# Patient Record
Sex: Male | Born: 1955
Health system: Southern US, Community
[De-identification: ages and names within clinical notes are randomized; demographics above are authoritative.]

## PROBLEM LIST (undated history)

## (undated) DIAGNOSIS — E876 Hypokalemia: Secondary | ICD-10-CM

## (undated) DIAGNOSIS — I10 Essential (primary) hypertension: Secondary | ICD-10-CM

## (undated) DIAGNOSIS — I213 ST elevation (STEMI) myocardial infarction of unspecified site: Secondary | ICD-10-CM

## (undated) DIAGNOSIS — M109 Gout, unspecified: Secondary | ICD-10-CM

## (undated) DIAGNOSIS — E785 Hyperlipidemia, unspecified: Secondary | ICD-10-CM

## (undated) HISTORY — DX: ST elevation (STEMI) myocardial infarction of unspecified site: I21.3

## (undated) HISTORY — DX: Essential (primary) hypertension: I10

## (undated) HISTORY — DX: Hypokalemia: E87.6

---

## 2016-11-25 ENCOUNTER — Encounter (HOSPITAL_BASED_OUTPATIENT_CLINIC_OR_DEPARTMENT_OTHER): Payer: Self-pay | Admitting: Emergency Medicine

## 2016-11-25 ENCOUNTER — Emergency Department (HOSPITAL_BASED_OUTPATIENT_CLINIC_OR_DEPARTMENT_OTHER): Payer: Self-pay

## 2016-11-25 ENCOUNTER — Inpatient Hospital Stay (HOSPITAL_BASED_OUTPATIENT_CLINIC_OR_DEPARTMENT_OTHER)
Admission: EM | Admit: 2016-11-25 | Discharge: 2016-11-27 | DRG: 282 | Disposition: A | Discharge status: Home / Self Care | Payer: Self-pay | Attending: Cardiovascular Disease | Admitting: Cardiovascular Disease

## 2016-11-25 ENCOUNTER — Encounter (HOSPITAL_COMMUNITY)
Admission: EM | Disposition: A | Discharge status: Home / Self Care | Payer: Self-pay | Source: Home / Self Care | Attending: Cardiovascular Disease

## 2016-11-25 ENCOUNTER — Other Ambulatory Visit: Payer: Self-pay

## 2016-11-25 DIAGNOSIS — I213 ST elevation (STEMI) myocardial infarction of unspecified site: Secondary | ICD-10-CM

## 2016-11-25 DIAGNOSIS — R402414 Glasgow coma scale score 13-15, 24 hours or more after hospital admission: Secondary | ICD-10-CM | POA: Diagnosis not present

## 2016-11-25 DIAGNOSIS — I2129 ST elevation (STEMI) myocardial infarction involving other sites: Secondary | ICD-10-CM

## 2016-11-25 DIAGNOSIS — I25110 Atherosclerotic heart disease of native coronary artery with unstable angina pectoris: Secondary | ICD-10-CM | POA: Diagnosis present

## 2016-11-25 DIAGNOSIS — I10 Essential (primary) hypertension: Secondary | ICD-10-CM | POA: Diagnosis present

## 2016-11-25 DIAGNOSIS — Z79899 Other long term (current) drug therapy: Secondary | ICD-10-CM

## 2016-11-25 DIAGNOSIS — E785 Hyperlipidemia, unspecified: Secondary | ICD-10-CM

## 2016-11-25 DIAGNOSIS — E876 Hypokalemia: Secondary | ICD-10-CM

## 2016-11-25 HISTORY — DX: Hyperlipidemia, unspecified: E78.5

## 2016-11-25 HISTORY — PX: LEFT HEART CATH AND CORONARY ANGIOGRAPHY: CATH118249

## 2016-11-25 HISTORY — DX: ST elevation (STEMI) myocardial infarction of unspecified site: I21.3

## 2016-11-25 HISTORY — DX: ST elevation (STEMI) myocardial infarction involving other sites: I21.29

## 2016-11-25 HISTORY — DX: Essential (primary) hypertension: I10

## 2016-11-25 LAB — POCT I-STAT, CHEM 8
BUN: 15 mg/dL (ref 6–20)
CHLORIDE: 101 mmol/L (ref 101–111)
CREATININE: 1 mg/dL (ref 0.61–1.24)
Calcium, Ion: 1.16 mmol/L (ref 1.15–1.40)
Glucose, Bld: 133 mg/dL — ABNORMAL HIGH (ref 65–99)
HEMATOCRIT: 43 % (ref 39.0–52.0)
HEMOGLOBIN: 14.6 g/dL (ref 13.0–17.0)
Potassium: 3 mmol/L — ABNORMAL LOW (ref 3.5–5.1)
Sodium: 142 mmol/L (ref 135–145)
TCO2: 28 mmol/L (ref 22–32)

## 2016-11-25 LAB — CBC WITH DIFFERENTIAL/PLATELET
BASOS ABS: 0 10*3/uL (ref 0.0–0.1)
BASOS PCT: 1 %
Eosinophils Absolute: 0.2 10*3/uL (ref 0.0–0.7)
Eosinophils Relative: 3 %
HEMATOCRIT: 42.7 % (ref 39.0–52.0)
HEMOGLOBIN: 14.7 g/dL (ref 13.0–17.0)
LYMPHS PCT: 43 %
Lymphs Abs: 3.1 10*3/uL (ref 0.7–4.0)
MCH: 28.3 pg (ref 26.0–34.0)
MCHC: 34.4 g/dL (ref 30.0–36.0)
MCV: 82.1 fL (ref 78.0–100.0)
MONO ABS: 0.7 10*3/uL (ref 0.1–1.0)
MONOS PCT: 10 %
NEUTROS ABS: 3.2 10*3/uL (ref 1.7–7.7)
NEUTROS PCT: 43 %
Platelets: 145 10*3/uL — ABNORMAL LOW (ref 150–400)
RBC: 5.2 MIL/uL (ref 4.22–5.81)
RDW: 14 % (ref 11.5–15.5)
WBC: 7.3 10*3/uL (ref 4.0–10.5)

## 2016-11-25 LAB — COMPREHENSIVE METABOLIC PANEL
ALBUMIN: 4 g/dL (ref 3.5–5.0)
ALK PHOS: 51 U/L (ref 38–126)
ALT: 18 U/L (ref 17–63)
AST: 28 U/L (ref 15–41)
Anion gap: 5 (ref 5–15)
BILIRUBIN TOTAL: 0.3 mg/dL (ref 0.3–1.2)
BUN: 16 mg/dL (ref 6–20)
CALCIUM: 9 mg/dL (ref 8.9–10.3)
CO2: 31 mmol/L (ref 22–32)
CREATININE: 1.13 mg/dL (ref 0.61–1.24)
Chloride: 103 mmol/L (ref 101–111)
GFR calc Af Amer: >60 mL/min (ref >60)
GLUCOSE: 175 mg/dL — AB (ref 65–99)
Potassium: 2.9 mmol/L — ABNORMAL LOW (ref 3.5–5.1)
Sodium: 139 mmol/L (ref 135–145)
TOTAL PROTEIN: 7.4 g/dL (ref 6.5–8.1)

## 2016-11-25 LAB — PROTIME-INR
INR: 0.94
Prothrombin Time: 12.5 seconds (ref 11.4–15.2)

## 2016-11-25 LAB — TROPONIN I
TROPONIN I: 0.03 ng/mL — AB (ref <0.03)
Troponin I: 0.6 ng/mL (ref <0.03)

## 2016-11-25 LAB — MRSA PCR SCREENING: MRSA by PCR: NEGATIVE

## 2016-11-25 SURGERY — LEFT HEART CATH AND CORONARY ANGIOGRAPHY
Anesthesia: LOCAL

## 2016-11-25 MED ORDER — METOPROLOL TARTRATE 25 MG PO TABS
25.0000 mg | ORAL_TABLET | Freq: Two times a day (BID) | ORAL | Status: DC
  Administered 2016-11-25 – 2016-11-27 (×4): 25 mg via ORAL
  Filled 2016-11-25 (×4): qty 1

## 2016-11-25 MED ORDER — IOPAMIDOL (ISOVUE-370) INJECTION 76%
INTRAVENOUS | Status: DC | PRN
  Administered 2016-11-25: 80 mL via INTRA_ARTERIAL

## 2016-11-25 MED ORDER — ATORVASTATIN CALCIUM 80 MG PO TABS
80.0000 mg | ORAL_TABLET | Freq: Every day | ORAL | Status: DC
  Administered 2016-11-26: 80 mg via ORAL
  Filled 2016-11-25: qty 1

## 2016-11-25 MED ORDER — NITROGLYCERIN IN D5W 200-5 MCG/ML-% IV SOLN
INTRAVENOUS | Status: AC
  Filled 2016-11-25: qty 250

## 2016-11-25 MED ORDER — VERAPAMIL HCL 2.5 MG/ML IV SOLN
INTRAVENOUS | Status: AC
  Filled 2016-11-25: qty 2

## 2016-11-25 MED ORDER — MORPHINE SULFATE (PF) 4 MG/ML IV SOLN: 2.0000 mg | INTRAVENOUS | Status: DC | PRN

## 2016-11-25 MED ORDER — IOPAMIDOL (ISOVUE-370) INJECTION 76%
INTRAVENOUS | Status: AC
  Filled 2016-11-25: qty 125

## 2016-11-25 MED ORDER — SODIUM CHLORIDE 0.9 % IV SOLN
INTRAVENOUS | Status: AC
  Administered 2016-11-25: 20:00:00 via INTRAVENOUS

## 2016-11-25 MED ORDER — HEPARIN SODIUM (PORCINE) 5000 UNIT/ML IJ SOLN
4000.0000 [IU] | Freq: Once | INTRAMUSCULAR | Status: AC
  Administered 2016-11-25: 4000 [IU] via INTRAVENOUS

## 2016-11-25 MED ORDER — HEPARIN (PORCINE) IN NACL 2-0.9 UNIT/ML-% IJ SOLN
INTRAMUSCULAR | Status: DC | PRN
  Administered 2016-11-25: 1000 mL via INTRA_ARTERIAL

## 2016-11-25 MED ORDER — VERAPAMIL HCL 2.5 MG/ML IV SOLN
INTRA_ARTERIAL | Status: DC | PRN
  Administered 2016-11-25: 19:00:00 via INTRA_ARTERIAL

## 2016-11-25 MED ORDER — SODIUM CHLORIDE 0.9 % IV SOLN: 250.0000 mL | INTRAVENOUS | Status: DC | PRN

## 2016-11-25 MED ORDER — NITROGLYCERIN IN D5W 200-5 MCG/ML-% IV SOLN: 0.0000 ug/min | Freq: Once | INTRAVENOUS | Status: DC

## 2016-11-25 MED ORDER — ASPIRIN 81 MG PO CHEW
CHEWABLE_TABLET | ORAL | Status: AC
  Filled 2016-11-25: qty 4

## 2016-11-25 MED ORDER — ASPIRIN 81 MG PO CHEW
324.0000 mg | CHEWABLE_TABLET | Freq: Once | ORAL | Status: AC
  Administered 2016-11-25: 324 mg via ORAL

## 2016-11-25 MED ORDER — TICAGRELOR 90 MG PO TABS
90.0000 mg | ORAL_TABLET | Freq: Two times a day (BID) | ORAL | Status: DC
  Administered 2016-11-26 – 2016-11-27 (×3): 90 mg via ORAL
  Filled 2016-11-25 (×4): qty 1

## 2016-11-25 MED ORDER — SODIUM CHLORIDE 0.9% FLUSH: 3.0000 mL | Freq: Two times a day (BID) | INTRAVENOUS | Status: DC

## 2016-11-25 MED ORDER — HEPARIN SODIUM (PORCINE) 1000 UNIT/ML IJ SOLN
INTRAMUSCULAR | Status: AC
  Filled 2016-11-25: qty 1

## 2016-11-25 MED ORDER — ASPIRIN 81 MG PO CHEW
81.0000 mg | CHEWABLE_TABLET | Freq: Every day | ORAL | Status: DC
  Administered 2016-11-26 – 2016-11-27 (×2): 81 mg via ORAL
  Filled 2016-11-25 (×2): qty 1

## 2016-11-25 MED ORDER — NITROGLYCERIN 1 MG/10 ML FOR IR/CATH LAB
INTRA_ARTERIAL | Status: AC
  Filled 2016-11-25: qty 10

## 2016-11-25 MED ORDER — HEPARIN SODIUM (PORCINE) 1000 UNIT/ML IJ SOLN
INTRAMUSCULAR | Status: DC | PRN
  Administered 2016-11-25: 5000 [IU] via INTRAVENOUS

## 2016-11-25 MED ORDER — ACETAMINOPHEN 325 MG PO TABS: 650.0000 mg | ORAL_TABLET | ORAL | Status: DC | PRN

## 2016-11-25 MED ORDER — ONDANSETRON HCL 4 MG/2ML IJ SOLN
4.0000 mg | Freq: Four times a day (QID) | INTRAMUSCULAR | Status: DC | PRN
  Administered 2016-11-25: 4 mg via INTRAVENOUS
  Filled 2016-11-25: qty 2

## 2016-11-25 MED ORDER — HEPARIN (PORCINE) IN NACL 100-0.45 UNIT/ML-% IJ SOLN
1550.0000 [IU]/h | INTRAMUSCULAR | Status: DC
  Administered 2016-11-26: 1350 [IU]/h via INTRAVENOUS
  Filled 2016-11-25: qty 250

## 2016-11-25 MED ORDER — HEPARIN SODIUM (PORCINE) 5000 UNIT/ML IJ SOLN
INTRAMUSCULAR | Status: AC
  Filled 2016-11-25: qty 1

## 2016-11-25 MED ORDER — NITROGLYCERIN 0.4 MG SL SUBL: 0.4000 mg | SUBLINGUAL_TABLET | SUBLINGUAL | Status: DC | PRN

## 2016-11-25 MED ORDER — POTASSIUM CHLORIDE CRYS ER 20 MEQ PO TBCR
40.0000 meq | EXTENDED_RELEASE_TABLET | Freq: Once | ORAL | Status: AC
  Administered 2016-11-25: 40 meq via ORAL
  Filled 2016-11-25: qty 2

## 2016-11-25 MED ORDER — SODIUM CHLORIDE 0.9% FLUSH
3.0000 mL | INTRAVENOUS | Status: DC | PRN
Start: 2016-11-25 — End: 2016-11-27
  Administered 2016-11-26: 10 mL via INTRAVENOUS
  Filled 2016-11-25: qty 3

## 2016-11-25 MED ORDER — HEPARIN (PORCINE) IN NACL 100-0.45 UNIT/ML-% IJ SOLN
INTRAMUSCULAR | Status: AC
Start: 2016-11-25 — End: 2016-11-26
  Filled 2016-11-25: qty 250

## 2016-11-25 MED ORDER — TICAGRELOR 90 MG PO TABS
ORAL_TABLET | ORAL | Status: AC
  Filled 2016-11-25: qty 2

## 2016-11-25 MED ORDER — TICAGRELOR 90 MG PO TABS
ORAL_TABLET | ORAL | Status: DC | PRN
  Administered 2016-11-25: 180 mg via ORAL

## 2016-11-25 MED ORDER — PROMETHAZINE HCL 25 MG/ML IJ SOLN
12.5000 mg | Freq: Four times a day (QID) | INTRAMUSCULAR | Status: DC | PRN
  Administered 2016-11-25: 12.5 mg via INTRAVENOUS
  Filled 2016-11-25: qty 1

## 2016-11-25 MED ORDER — POTASSIUM CHLORIDE 10 MEQ/100ML IV SOLN
INTRAVENOUS | Status: DC | PRN
Start: 2016-11-25 — End: 2016-11-25
  Administered 2016-11-25: 10 meq via INTRAVENOUS

## 2016-11-25 MED ORDER — HEPARIN (PORCINE) IN NACL 100-0.45 UNIT/ML-% IJ SOLN
15.0000 [IU]/kg/h | Freq: Once | INTRAMUSCULAR | Status: AC
  Administered 2016-11-25: 15 [IU]/kg/h via INTRAVENOUS

## 2016-11-25 MED ORDER — SODIUM CHLORIDE 0.9 % IV SOLN
INTRAVENOUS | Status: DC | PRN
  Administered 2016-11-25: 50 mL/h via INTRAVENOUS

## 2016-11-25 MED ORDER — LIDOCAINE HCL (PF) 1 % IJ SOLN
INTRAMUSCULAR | Status: DC | PRN
  Administered 2016-11-25: 2 mL

## 2016-11-25 MED ORDER — LIDOCAINE HCL (PF) 1 % IJ SOLN
INTRAMUSCULAR | Status: AC
  Filled 2016-11-25: qty 30

## 2016-11-25 MED ORDER — POTASSIUM CHLORIDE 10 MEQ/100ML IV SOLN
10.0000 meq | INTRAVENOUS | Status: AC
  Administered 2016-11-25 (×3): 10 meq via INTRAVENOUS

## 2016-11-25 MED ORDER — MORPHINE SULFATE (PF) 10 MG/ML IV SOLN: 2.0000 mg | INTRAVENOUS | Status: DC | PRN

## 2016-11-25 MED ORDER — HEPARIN (PORCINE) IN NACL 2-0.9 UNIT/ML-% IJ SOLN
INTRAMUSCULAR | Status: AC
  Filled 2016-11-25: qty 1500

## 2016-11-25 SURGICAL SUPPLY — 13 items
CATH INFINITI 5FR ANG PIGTAIL (CATHETERS) ×2 IMPLANT
CATH OPTITORQUE TIG 4.0 5F (CATHETERS) ×2 IMPLANT
DEVICE RAD COMP TR BAND LRG (VASCULAR PRODUCTS) ×2 IMPLANT
GLIDESHEATH SLEND A-KIT 6F 22G (SHEATH) ×2 IMPLANT
GUIDEWIRE INQWIRE 1.5J.035X260 (WIRE) ×1 IMPLANT
INQWIRE 1.5J .035X260CM (WIRE) ×2
KIT ENCORE 26 ADVANTAGE (KITS) ×2 IMPLANT
KIT HEART LEFT (KITS) ×2 IMPLANT
PACK CARDIAC CATHETERIZATION (CUSTOM PROCEDURE TRAY) ×2 IMPLANT
SYR MEDRAD MARK V 150ML (SYRINGE) ×2 IMPLANT
TRANSDUCER W/STOPCOCK (MISCELLANEOUS) ×2 IMPLANT
TUBING CIL FLEX 10 FLL-RA (TUBING) ×2 IMPLANT
WIRE HI TORQ VERSACORE-J 145CM (WIRE) ×2 IMPLANT

## 2016-11-25 NOTE — ED Provider Notes (Signed)
[facility] CARDIOVASCULAR ICU Provider Note   CSN: [id number] Arrival date & time: 11/25/16  1748     History   Chief Complaint Chief Complaint  Patient presents with  . Chest Pain    HPI Vincent [name] is a 61 y.o. male. Chief complaint is chest pain, diaphoresis. Symptoms started 2 hours ago.  HPI Vincent [name] is a 61 year old male. History of hypertension and high cholesterol.  He is on an unknown blood pressure medication. Nonsmoker. No history of diabetes. No history of first relative with heart disease. No exertional symptoms.  About 2 hours ago he states he was driving. He started feeling pressure in his chest. Started on the right side of his sternum and progress to the left side and into his back. He became diaphoretic. His pain became severe. He states that has improved but not resolved now. No past similar episodes.  Past Medical History:  Diagnosis Date  . Hyperlipidemia   . Hypertension     Patient Active Problem List   Diagnosis Date Noted  . 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 Acute ST elevation myocardial infarction (STEMI) of lateral wall (HCC) 11/25/2016    History reviewed. No pertinent surgical history.     Home Medications    Prior to Admission medications   Medication Sig Start Date End Date Taking? Authorizing Provider  UNKNOWN TO PATIENT 2 (two) times daily. Pt states that it is Lopressor or Lisinopril.    [provider]    Family History History reviewed. No pertinent family history.  Social History Social History   Tobacco Use  . Smoking status: Never Smoker  . Smokeless tobacco: Never Used  Substance Use Topics  . Alcohol use: No    Frequency: Never  . Drug use: No     Allergies   Patient has no known allergies.   Review of Systems Review of Systems  Constitutional: Positive for diaphoresis. Negative for appetite change, chills, fatigue and fever.  HENT: Negative for  mouth sores, sore throat and trouble swallowing.   Eyes: Negative for visual disturbance.  Respiratory: Negative for cough, chest tightness, shortness of breath and wheezing.   Cardiovascular: Positive for chest pain.  Gastrointestinal: Negative for abdominal distention, abdominal pain, diarrhea, nausea and vomiting.  Endocrine: Negative for polydipsia, polyphagia and polyuria.  Genitourinary: Negative for dysuria, frequency and hematuria.  Musculoskeletal: Negative for gait problem.  Skin: Negative for color change, pallor and rash.  Neurological: Negative for dizziness, syncope, light-headedness and headaches.  Hematological: Does not bruise/bleed easily.  Psychiatric/Behavioral: Negative for behavioral problems and confusion.     Physical Exam Updated Vital Signs BP (!) 150/86   Pulse 88   Temp 98.2 F (36.8 C) (Oral)   Resp 16   Ht 5\' 11"  (1.803 m)   Wt 102.1 kg (225 lb)   SpO2 100%   BMI 31.38 kg/m   Physical Exam  Constitutional: He is oriented to person, place, and time. He appears well-developed and well-nourished. No distress.  Athletically built fit-appearing 61 year old male. No distress.  HENT:  Head: Normocephalic.  Eyes: Conjunctivae are normal. Pupils are equal, round, and reactive to light. No scleral icterus.  Neck: Normal range of motion. Neck supple. No thyromegaly present.  Cardiovascular: Normal rate and regular rhythm. Exam reveals no gallop and no friction rub.  No murmur heard. Pulmonary/Chest: Effort normal and breath sounds normal. No respiratory distress. He has no wheezes. He has no rales.  Abdominal: Soft.  Bowel sounds are normal. He exhibits no distension. There is no tenderness. There is no rebound.  Musculoskeletal: Normal range of motion.  Neurological: He is alert and oriented to person, place, and time.  Skin: Skin is warm and dry. No rash noted.  Psychiatric: He has a normal mood and affect. His behavior is normal.     ED Treatments /  Results  Labs (all labs ordered are listed, but only abnormal results are displayed) Labs Reviewed  CBC WITH DIFFERENTIAL/PLATELET - Abnormal; Notable for the following components:      Result Value   Platelets 145 (*)    All other components within normal limits  COMPREHENSIVE METABOLIC PANEL - Abnormal; Notable for the following components:   Potassium 2.9 (*)    Glucose, Bld 175 (*)    All other components within normal limits  TROPONIN I - Abnormal; Notable for the following components:   Troponin I 0.03 (*)    All other components within normal limits  POCT I-STAT, CHEM 8 - Abnormal; Notable for the following components:   Potassium 3.0 (*)    Glucose, Bld 133 (*)    All other components within normal limits  MRSA PCR SCREENING  PROTIME-INR  TROPONIN I  TROPONIN I  TROPONIN I  CBC  BASIC METABOLIC PANEL    EKG  EKG Interpretation None       Radiology Dg Chest Port 1 View  Result Date: 11/25/2016 CLINICAL DATA:  Left-sided chest pain EXAM: PORTABLE CHEST 1 VIEW COMPARISON:  None. FINDINGS: The heart size and mediastinal contours are within normal limits. Both lungs are clear. The visualized skeletal structures are unremarkable. IMPRESSION: No active disease. Electronically Signed   By: Alcide CleverMark  Lukens M.D.   On: 11/25/2016 18:19    Procedures Procedures (including critical care time)  Medications Ordered in ED Medications  nitroGLYCERIN 50 mg in dextrose 5 % 250 mL (0.2 mg/mL) infusion ( Intravenous MAR Unhold 11/25/16 1945)  nitroGLYCERIN 0.2 mg/mL in dextrose 5 % infusion (not administered)  acetaminophen (TYLENOL) tablet 650 mg (not administered)  ondansetron (ZOFRAN) injection 4 mg (4 mg Intravenous Given 11/25/16 2015)  0.9 %  sodium chloride infusion (not administered)  sodium chloride flush (NS) 0.9 % injection 3 mL (not administered)  sodium chloride flush (NS) 0.9 % injection 3 mL (not administered)  0.9 %  sodium chloride infusion (not administered)    aspirin chewable tablet 81 mg (not administered)  ticagrelor (BRILINTA) tablet 90 mg (not administered)  morphine 4 MG/ML injection 2 mg (not administered)  heparin injection 4,000 Units (4,000 Units Intravenous Given 11/25/16 1803)  heparin ADULT infusion 100 units/mL (25000 units/21850mL sodium chloride 0.45%) (15 Units/kg/hr  102.1 kg Intravenous New Bag/Given 11/25/16 1806)  aspirin chewable tablet 324 mg (324 mg Oral Given 11/25/16 1802)     Initial Impression / Assessment and Plan / ED Course  I have reviewed the triage vital signs and the nursing notes.  Pertinent labs & imaging results that were available during my care of the patient were reviewed by me and considered in my medical decision making (see chart for details).   EKG shows acute ST elevations 1 mm in lead 1, and aVL. His reciprocal ST depressions in 2, 3, and death. Anteriorly show no acute changes.  Upon evaluating the patient and reviewed his EKG code STEMI was called. Discussed case immediately with Dr. Allyson SabalBerry. He requests transmission of images. They were related directly to him. Patient was given heparin bolus, ordering 4, Zofran  4. States that he did have a dose of Viagra a few hours ago. A glycerin was held. Given baby aspirin 81 mg 4. His pain improved and resolved as he was leaving the department with Tresa Endo. A repeat EKG shows persistence of this ST elevations.   CRITICAL CARE Performed by: Rolland Porter JOSEPH   Total critical care time: 26 minutes  Critical care time was exclusive of separately billable procedures and treating other patients.  Critical care was necessary to treat or prevent imminent or life-threatening deterioration.  Critical care was time spent personally by me on the following activities: development of treatment plan with patient and/or surrogate as well as nursing, discussions with consultants, evaluation of patient's response to treatment, examination of patient, obtaining history from  patient or surrogate, ordering and performing treatments and interventions, ordering and review of laboratory studies, ordering and review of radiographic studies, pulse oximetry and re-evaluation of patient's condition.    Final Clinical Impressions(s) / ED Diagnoses   Final diagnoses:  ST elevation myocardial infarction (STEMI), unspecified artery [facility])    ED Discharge Orders    None       Rolland Porter, MD 11/25/16 2021

## 2016-11-25 NOTE — Progress Notes (Addendum)
ANTICOAGULATION CONSULT NOTE  Pharmacy Consult for heparin Indication: chest pain/ACS  Heparin Dosing Weight: 96.5 kg   Assessment: 61 yom presenting with CP - Code STEMI called, [facility]. Pharmacy consulted to dose heparin. Not on anticoagulation PTA. Hg wnl, plt 145. No bleed documented.  [facility] - 4000 unit bolus x 1 and drip started at 15 units/kg/hr per Cardiology recommendations - a little higher than recommended with heparin dosing weight, but [facility] at this point.  Goal of Therapy:  Heparin level 0.3-0.7 units/ml Monitor platelets by anticoagulation protocol: Yes   Plan:  Continue heparin drip at 1500 units/hr as ordered per Cardiology F/u plans for heparin post-cath Monitor CBC, s/sx bleeding  Vincent [name], PharmD, BCPS Clinical Pharmacist 11/25/2016 6:07 PM   ADDENDUM:  S/p cath - to treat medically. Pharmacy consulted to resume heparin 2 hours post-TR band removal, no bolus. No bleed issues reported post-cath.  Will f/u with RN for TR band removal to schedule heparin.  Plan: Resume heparin at 1350 units/hr with no bolus (2hrs post-TR band removal) 6h heparin level Daily heparin level/CBC Monitor for s/sx bleeding  Vincent [name], PharmD, BCPS Clinical Pharmacist 11/25/2016 7:13 PM

## 2016-11-25 NOTE — ED Notes (Signed)
Attempted to call cath lab at 2 numbers; no answer [phone]

## 2016-11-25 NOTE — ED Notes (Signed)
[facility] to [facility]

## 2016-11-25 NOTE — ED Notes (Signed)
Pt reports taking erectile dysfunction med 2 hrs ago

## 2016-11-25 NOTE — ED Notes (Signed)
Pt denies CP at this time; NTG held per EDP; repeat EKG at this time.

## 2016-11-25 NOTE — ED Triage Notes (Signed)
Pt c/o LT CP, diaphoresis

## 2016-11-25 NOTE — Progress Notes (Signed)
CRITICAL VALUE ALERT  Critical Value:  Troponin 0.60  Date & Time Notied:  11/25/16 @ 2119  Provider Notified: Expected/consistent with previous results  Orders Received/Actions taken: Continue with current plan of post-cath care and medical management

## 2016-11-25 NOTE — H&P (Signed)
CARDIOLOGY HISTORY & PHYSICAL   Referring Physician: Dr. Fayrene FearingJames Primary Physician:  Primary Cardiologist: none prior Reason for Admission: STEMI   HPI: [name] is a 61 yo man with PMH hypertension, hyperlipidemia who presented to ER with two hours of chest pain. He was diagnosed as STEMI and [facility]. The patient reports about two hours prior to presentation he developed right and left sided chest pressure that became severe, associated with diaphoresis and nausea. The pain occurred while he was driving. No prior similar symptoms. No known CAD. On arrival to the ER he was treated with aspirin 324 mg and heparin 4000 units, followed by a heparin drip. He did not receive nitroglycerin as he endorsed recent PDE inhibitor use. His initial ECG was concerning for ST elevations in leads 1 and aVL with reciprocal inferior depressions. Code STEMI was called. His chest pain resolved while in the ER.  On [facility], he was comfortable and chest pain free. He is a nonsmoker, not diabetic.   Review of Systems:     Cardiac Review of Systems: {Y] = yes [ ]  = no  Chest Pain [ Y   ]  Resting SOB [ N  ] Exertional SOB  Klaus.Mock[N  ]  Orthopnea [ N ]   Pedal Edema [  N ]    Palpitations Klaus.Mock[N  ] Syncope  Klaus.Mock[N  ]   Presyncope [ N  ]  General Review of Systems: [Y] = yes [  ]=no Constitional: recent weight change [  ]; anorexia [  ]; fatigue [  ]; nausea [  ]; night sweats [  ]; fever [  ]; or chills [  ];                                                                     Eyes : blurred vision [  ]; diplopia [   ]; vision changes [  ];  Amaurosis fugax[  ]; Resp: cough [  ];  wheezing[  ];  hemoptysis[  ];  PND [  ];  GI:  gallstones[  ], vomiting[  ];  dysphagia[  ]; melena[  ];  hematochezia [  ]; heartburn[  ];   GU: kidney stones [  ]; hematuria[  ];   dysuria [  ];  nocturia[  ]; incontinence [  ];             Skin: rash, swelling[  ];, hair loss[  ];  peripheral edema[  ];   or itching[  ]; Musculosketetal: myalgias[  ];  joint swelling[  ];  joint erythema[  ];  joint pain[  ];  back pain[  ];  Heme/Lymph: bruising[  ];  bleeding[  ];  anemia[  ];  Neuro: TIA[  ];  headaches[  ];  stroke[  ];  vertigo[  ];  seizures[  ];   paresthesias[  ];  difficulty walking[  ];  Psych:depression[  ]; anxiety[  ];  Endocrine: diabetes[  ];  thyroid dysfunction[  ];  Other:  Past Medical History:  Diagnosis Date  . Hyperlipidemia   . Hypertension     Medications Prior to Admission  Medication Sig Dispense Refill  .  UNKNOWN TO PATIENT 2 (two) times daily. Pt states that it is Lopressor or Lisinopril.       Melene Muller ON 11/26/2016] aspirin  81 mg Oral Daily  . [START ON 11/26/2016] atorvastatin  80 mg Oral q1800  . metoprolol tartrate  25 mg Oral BID  . potassium chloride  40 mEq Oral Once  . [START ON 11/26/2016] ticagrelor  90 mg Oral BID    Infusions: . sodium chloride    . sodium chloride    . nitroGLYCERIN    . potassium chloride      No Known Allergies  Social History   Socioeconomic History  . Marital status: Single    Spouse name: Not on file  . Number of children: Not on file  . Years of education: Not on file  . Highest education level: Not on file  Social Needs  . Financial resource strain: Not on file  . Food insecurity - worry: Not on file  . Food insecurity - inability: Not on file  . Transportation needs - medical: Not on file  . Transportation needs - non-medical: Not on file  Occupational History  . Not on file  Tobacco Use  . Smoking status: Never Smoker  . Smokeless tobacco: Never Used  Substance and Sexual Activity  . Alcohol use: No    Frequency: Never  . Drug use: No  . Sexual activity: Not on file  Other Topics Concern  . Not on file  Social History Narrative  . Not on file   Married, four children, still actively working. Nonsmoker.  History reviewed. No pertinent family history.  PHYSICAL EXAM: Vitals:    11/25/16 1908 11/25/16 1913  BP: (!) 156/89 (!) 150/86  Pulse: 85 88  Resp: 15 16  Temp:    SpO2: 100% 100%     Intake/Output Summary (Last 24 hours) at 11/25/2016 2123 Last data filed at 11/25/2016 1930 Gross per 24 hour  Intake -  Output 300 ml  Net -300 ml    General:  Well appearing. No respiratory difficulty HEENT: normal Neck: supple. no JVD. Carotids 2+ bilat; no bruits. No lymphadenopathy or thryomegaly appreciated. Cor: PMI nondisplaced. Regular rate & rhythm. No rubs, gallops or murmurs. Lungs: clear Abdomen: soft, nontender, nondistended. No hepatosplenomegaly. No bruits or masses. Good bowel sounds. Extremities: no cyanosis, clubbing, rash, edema Neuro: alert & oriented x 3, cranial nerves grossly intact. moves all 4 extremities w/o difficulty. Affect pleasant.  ECG: post cath, normal sinus rhythm, resolution of ST elevations.   Results for orders placed or performed during the hospital encounter of 11/25/16 (from the past 24 hour(s))  CBC with Differential/Platelet     Status: Abnormal   Collection Time: 11/25/16  5:50 PM  Result Value Ref Range   WBC 7.3 4.0 - 10.5 K/uL   RBC 5.20 4.22 - 5.81 MIL/uL   Hemoglobin 14.7 13.0 - 17.0 g/dL   HCT 16.1 09.6 - 04.5 %   MCV 82.1 78.0 - 100.0 fL   MCH 28.3 26.0 - 34.0 pg   MCHC 34.4 30.0 - 36.0 g/dL   RDW 40.9 81.1 - 91.4 %   Platelets 145 (L) 150 - 400 K/uL   Neutrophils Relative % 43 %   Neutro Abs 3.2 1.7 - 7.7 K/uL   Lymphocytes Relative 43 %   Lymphs Abs 3.1 0.7 - 4.0 K/uL   Monocytes Relative 10 %   Monocytes Absolute 0.7 0.1 - 1.0 K/uL   Eosinophils Relative 3 %  Eosinophils Absolute 0.2 0.0 - 0.7 K/uL   Basophils Relative 1 %   Basophils Absolute 0.0 0.0 - 0.1 K/uL  Comprehensive metabolic panel     Status: Abnormal   Collection Time: 11/25/16  5:50 PM  Result Value Ref Range   Sodium 139 135 - 145 mmol/L   Potassium 2.9 (L) 3.5 - 5.1 mmol/L   Chloride 103 101 - 111 mmol/L   CO2 31 22 - 32 mmol/L     Glucose, Bld 175 (H) 65 - 99 mg/dL   BUN 16 6 - 20 mg/dL   Creatinine, Ser 8.11 0.61 - 1.24 mg/dL   Calcium 9.0 8.9 - 91.4 mg/dL   Total Protein 7.4 6.5 - 8.1 g/dL   Albumin 4.0 3.5 - 5.0 g/dL   AST 28 15 - 41 U/L   ALT 18 17 - 63 U/L   Alkaline Phosphatase 51 38 - 126 U/L   Total Bilirubin 0.3 0.3 - 1.2 mg/dL   GFR calc non Af Amer >60 >60 mL/min   GFR calc Af Amer >60 >60 mL/min   Anion gap 5 5 - 15  Troponin I     Status: Abnormal   Collection Time: 11/25/16  5:50 PM  Result Value Ref Range   Troponin I 0.03 (HH) <0.03 ng/mL  Protime-INR     Status: None   Collection Time: 11/25/16  5:50 PM  Result Value Ref Range   Prothrombin Time 12.5 11.4 - 15.2 seconds   INR 0.94   I-STAT, chem 8     Status: Abnormal   Collection Time: 11/25/16  7:12 PM  Result Value Ref Range   Sodium 142 135 - 145 mmol/L   Potassium 3.0 (L) 3.5 - 5.1 mmol/L   Chloride 101 101 - 111 mmol/L   BUN 15 6 - 20 mg/dL   Creatinine, Ser 7.82 0.61 - 1.24 mg/dL   Glucose, Bld 956 (H) 65 - 99 mg/dL   Calcium, Ion 2.13 0.86 - 1.40 mmol/L   TCO2 28 22 - 32 mmol/L   Hemoglobin 14.6 13.0 - 17.0 g/dL   HCT 57.8 46.9 - 62.9 %  Troponin I (q 6hr x 3)     Status: Abnormal   Collection Time: 11/25/16  8:06 PM  Result Value Ref Range   Troponin I 0.60 (HH) <0.03 ng/mL   Dg Chest Port 1 View  Result Date: 11/25/2016 CLINICAL DATA:  Left-sided chest pain EXAM: PORTABLE CHEST 1 VIEW COMPARISON:  None. FINDINGS: The heart size and mediastinal contours are within normal limits. Both lungs are clear. The visualized skeletal structures are unremarkable. IMPRESSION: No active disease. Electronically Signed   By: Alcide Clever M.D.   On: 11/25/2016 18:19   Cardiac cath: Dr. Earnest Conroy 1st Diag lesion is 60% stenosed.  Ost 1st Diag to 1st Diag lesion is 90% stenosed.  Prox LAD lesion is 50% stenosed.  Ost LM lesion is 40% stenosed.  Prox RCA to Mid RCA lesion is 40% stenosed.  The left ventricular systolic  function is normal.  LV end diastolic pressure is normal.  The left ventricular ejection fraction is greater than 65% by visual estimate.    ASSESSMENT/PLAN: Vincent [name] is a 61 yo man with PMH hypertension, hyperlipidemia who presented to ER with two hours of chest pain. He was diagnosed as STEMI and transferred to [facility] for emergent cath.  Cath revealed significant narrowing of D1, but it is a small caliber vessel with good distal flow. Has scattered  lesions in other vessels, though none requiring intervention at this time. Patient was stable, chest pain free throughout. Plan is for medical management  STEMI->NSTEMI -restart heparin 2 hours after TR band removed, no bolus. Heparin per pharmacy consult -continue aspirin 81 mg daily -start ticagrelor 90 mg twice daily -start atorvastatin 80 mg daily -he is not sure of his home BP medication, but given his HR and BP will start metoprolol 25 mg BID. -echo in AM -discuss cardiac rehab -trend troponins -lipid panel, A1c  -zofran not helping nausea, will try phenergan, watch QTc -no nitroglycerin given recent PDE inhibitor use for erectile dysfunction  Hypokalemia: initial 2.9 at OSH, 3.0 here -40 mEq IV -40 mEq PO (once tolerating) -Recheck after dosing, check again in AM  Hypertension: confirm home medications, continue metoprolol Hyperlipidemia: high intensity statin as above  Jodelle RedBridgette Isreal Moline, MD, PhD, overnight cardiology provider

## 2016-11-26 ENCOUNTER — Inpatient Hospital Stay (HOSPITAL_COMMUNITY): Payer: Self-pay

## 2016-11-26 ENCOUNTER — Other Ambulatory Visit: Payer: Self-pay

## 2016-11-26 ENCOUNTER — Encounter (HOSPITAL_COMMUNITY): Payer: Self-pay | Admitting: Cardiovascular Disease

## 2016-11-26 DIAGNOSIS — I213 ST elevation (STEMI) myocardial infarction of unspecified site: Secondary | ICD-10-CM

## 2016-11-26 LAB — BASIC METABOLIC PANEL
ANION GAP: 4 — AB (ref 5–15)
ANION GAP: 7 (ref 5–15)
BUN: 10 mg/dL (ref 6–20)
BUN: 8 mg/dL (ref 6–20)
CHLORIDE: 105 mmol/L (ref 101–111)
CHLORIDE: 104 mmol/L (ref 101–111)
CO2: 28 mmol/L (ref 22–32)
CO2: 28 mmol/L (ref 22–32)
Calcium: 8.8 mg/dL — ABNORMAL LOW (ref 8.9–10.3)
Calcium: 8.7 mg/dL — ABNORMAL LOW (ref 8.9–10.3)
Creatinine, Ser: 0.96 mg/dL (ref 0.61–1.24)
Creatinine, Ser: 1.11 mg/dL (ref 0.61–1.24)
GFR calc non Af Amer: >60 mL/min (ref >60)
GFR calc non Af Amer: >60 mL/min (ref >60)
Glucose, Bld: 137 mg/dL — ABNORMAL HIGH (ref 65–99)
Glucose, Bld: 141 mg/dL — ABNORMAL HIGH (ref 65–99)
POTASSIUM: 4.1 mmol/L (ref 3.5–5.1)
Potassium: 4.3 mmol/L (ref 3.5–5.1)
Sodium: 137 mmol/L (ref 135–145)
Sodium: 139 mmol/L (ref 135–145)

## 2016-11-26 LAB — ECHOCARDIOGRAM COMPLETE
HEIGHTINCHES: 71 in
WEIGHTICAEL: 3777.8 [oz_av]

## 2016-11-26 LAB — TROPONIN I
TROPONIN I: 3.72 ng/mL — AB (ref <0.03)
Troponin I: 5 ng/mL (ref <0.03)

## 2016-11-26 LAB — PROTIME-INR
INR: 1.07
Prothrombin Time: 13.8 seconds (ref 11.4–15.2)

## 2016-11-26 LAB — LIPID PANEL
CHOL/HDL RATIO: 4 ratio
CHOLESTEROL: 206 mg/dL — AB (ref 0–200)
HDL: 52 mg/dL (ref >40)
LDL Cholesterol: 129 mg/dL — ABNORMAL HIGH (ref 0–99)
TRIGLYCERIDES: 123 mg/dL (ref <150)
VLDL: 25 mg/dL (ref 0–40)

## 2016-11-26 LAB — CBC
HEMATOCRIT: 43 % (ref 39.0–52.0)
HEMOGLOBIN: 14.8 g/dL (ref 13.0–17.0)
MCH: 29.1 pg (ref 26.0–34.0)
MCHC: 34.4 g/dL (ref 30.0–36.0)
MCV: 84.5 fL (ref 78.0–100.0)
Platelets: 138 10*3/uL — ABNORMAL LOW (ref 150–400)
RBC: 5.09 MIL/uL (ref 4.22–5.81)
RDW: 14.5 % (ref 11.5–15.5)
WBC: 6.4 10*3/uL (ref 4.0–10.5)

## 2016-11-26 LAB — MAGNESIUM: Magnesium: 1.9 mg/dL (ref 1.7–2.4)

## 2016-11-26 LAB — HEMOGLOBIN A1C
HEMOGLOBIN A1C: 5.8 % — AB (ref 4.8–5.6)
Mean Plasma Glucose: 119.76 mg/dL

## 2016-11-26 LAB — HEPARIN LEVEL (UNFRACTIONATED): HEPARIN UNFRACTIONATED: 0.19 [IU]/mL — AB (ref 0.30–0.70)

## 2016-11-26 MED ORDER — HYDRALAZINE HCL 10 MG PO TABS
10.0000 mg | ORAL_TABLET | Freq: Four times a day (QID) | ORAL | Status: DC | PRN
  Administered 2016-11-26: 10 mg via ORAL
  Filled 2016-11-26: qty 1

## 2016-11-26 MED ORDER — LISINOPRIL 10 MG PO TABS
10.0000 mg | ORAL_TABLET | Freq: Every day | ORAL | Status: DC
  Administered 2016-11-26: 10 mg via ORAL
  Filled 2016-11-26 (×2): qty 1

## 2016-11-26 NOTE — Progress Notes (Signed)
CRITICAL VALUE ALERT  Critical Value:  Troponin 5.0  Date & Time Notied:  11/26/16 @ 234  Provider Notified: Expected results  Orders Received/Actions taken: Continue with current plan of care

## 2016-11-26 NOTE — Progress Notes (Signed)
2D Echocardiogram has been performed.  Vincent [name] 11/26/2016, 9:37 AM

## 2016-11-26 NOTE — Progress Notes (Signed)
Progress Note  Patient Name: Vincent CaulJohnny Hoselton Date of Encounter: 11/26/2016  Primary Cardiologist: new  Subjective   Patient states he feels great today. He denies further chest pain, denies shortness of breath, palpitations.   Inpatient Medications    Scheduled Meds: . aspirin  81 mg Oral Daily  . atorvastatin  80 mg Oral q1800  . metoprolol tartrate  25 mg Oral BID  . ticagrelor  90 mg Oral BID   Continuous Infusions: . sodium chloride    . heparin 1,350 Units/hr (11/26/16 0600)   PRN Meds: sodium chloride, acetaminophen, hydrALAZINE, ondansetron (ZOFRAN) IV, promethazine, sodium chloride flush   Vital Signs    Vitals:   11/26/16 0357 11/26/16 0400 11/26/16 0500 11/26/16 0600  BP:  (!) 142/123 (!) 154/90 (!) 156/97  Pulse: (!) 58 (!) 59 (!) 58 60  Resp: 18 16 16 16   Temp: 98.1 F (36.7 C)     TempSrc:      SpO2: 99% 99% 100% 100%  Weight:   236 lb 1.8 oz (107.1 kg)   Height:        Intake/Output Summary (Last 24 hours) at 11/26/2016 0808 Last data filed at 11/26/2016 0600 Gross per 24 hour  Intake 637.33 ml  Output 650 ml  Net -12.67 ml   Filed Weights   11/25/16 1758 11/26/16 0500  Weight: 225 lb (102.1 kg) 236 lb 1.8 oz (107.1 kg)    Telemetry    SR, occasional PVCs, 1 short run of NSVT - Personally Reviewed  ECG    SR, J point elevation in V1-3; resolution of ST elevation in I, aVL - Personally Reviewed  Physical Exam   GEN: No acute distress, lying in bed comfortably.   Neck: No JVD Cardiac: RRR, no murmurs, rubs, or gallops, R radial pulse 2+ and cath site looks good.  Respiratory: Clear to auscultation bilaterally. GI: Soft, nontender, non-distended, +BSs  MS: No edema; no deformity. Neuro:  Nonfocal  Psych: Normal affect   Labs    Chemistry Recent Labs  Lab 11/25/16 1750 11/25/16 1912 11/26/16 0128  NA 139 142 137  K 2.9* 3.0* 4.1  CL 103 101 105  CO2 31  --  28  GLUCOSE 175* 133* 137*  BUN 16 15 10   CREATININE 1.13 1.00  0.96  CALCIUM 9.0  --  8.8*  PROT 7.4  --   --   ALBUMIN 4.0  --   --   AST 28  --   --   ALT 18  --   --   ALKPHOS 51  --   --   BILITOT 0.3  --   --   GFRNONAA >60  --  >60  GFRAA >60  --  >60  ANIONGAP 5  --  4*     Hematology Recent Labs  Lab 11/25/16 1750 11/25/16 1912 11/26/16 0145  WBC 7.3  --  6.4  RBC 5.20  --  5.09  HGB 14.7 14.6 14.8  HCT 42.7 43.0 43.0  MCV 82.1  --  84.5  MCH 28.3  --  29.1  MCHC 34.4  --  34.4  RDW 14.0  --  14.5  PLT 145*  --  138*    Cardiac Enzymes Recent Labs  Lab 11/25/16 1750 11/25/16 2006 11/26/16 0128  TROPONINI 0.03* 0.60* 5.00*   No results for input(s): TROPIPOC in the last 168 hours.   BNPNo results for input(s): BNP, PROBNP in the last 168 hours.   DDimer No  results for input(s): DDIMER in the last 168 hours.   Radiology    Dg Chest Port 1 View  Result Date: 11/25/2016 CLINICAL DATA:  Left-sided chest pain EXAM: PORTABLE CHEST 1 VIEW COMPARISON:  None. FINDINGS: The heart size and mediastinal contours are within normal limits. Both lungs are clear. The visualized skeletal structures are unremarkable. IMPRESSION: No active disease. Electronically Signed   By: Alcide CleverMark  Lukens M.D.   On: 11/25/2016 18:19    Cardiac Studies   [facility]  Ost 1st Diag lesion is 60% stenosed.  Ost 1st Diag to 1st Diag lesion is 90% stenosed.  Prox LAD lesion is 50% stenosed.  Ost LM lesion is 40% stenosed.  Prox RCA to Mid RCA lesion is 40% stenosed.  The left ventricular systolic function is normal.  LV end diastolic pressure is normal.  The left ventricular ejection fraction is greater than 65% by visual estimate.  Patient Profile     61 y.o. male with PMH of HTN, HLD who presented with EKG consistent STEMI on 11/18 and [facility], which revealed narrowing of D1 but with good distal flow - no lesions required intervention.  Assessment & Plan    STEMI>NSTEMI: Troponins have trended up to 5  early this morning; LDL 129, A1c 5.8. --heparin gtt --asa 81mg  daily, ticagrelor 90mg  BID, atorvastatin 80mg  daily, metoprolol 25mg  BID --f/u Echo  Hypokalemia: Resolved, K 4.0 today.  HTN: --metoprolol 25mg  BID; would likely be able to add on ACE-I   Patient [facility], [facility]; will need PCP and f/u with [facility] at discharge.  For questions or updates, please contact [facility] Please consult [email or web address] for contact info under Cardiology/STEMI.   Signed, Nyra MarketGorica Svalina, MD  11/26/2016, 8:08 AM    I have personally seen and examined this patient with Dr. Samuella CotaSvalina. I agree with the assessment and plan as outlined above. He was admitted with a lateral STEMI but no targets for PCI on cath. Small diagonal branch with severe disease, not amenable to PCI. Will plan medical therapy with ASA and Brilinta, beta blocker, high dose statin. Will add Ace-inh. Transfer to telemetry unit today. Likely d/c home tomorrow.   Verne CarrowChristopher Morene Cecilio 11/26/2016 10:19 AM

## 2016-11-26 NOTE — Progress Notes (Signed)
   11/25/16 1930  Clinical Encounter Type  Visited With Family  Visit Type Trauma  Referral From Nurse  Spiritual Encounters  Spiritual Needs Emotional   Met with Vincent [name]. Helped her move from ED to cath lab to 2 heart waiting. Provided reassurance and comfort to [facility].

## 2016-11-26 NOTE — Care Management Note (Addendum)
Case Management Note  Patient Details  Name: Letitia CaulJohnny Dallaire MRN: [id number] Date of Birth: 09/30/1955  Subjective/Objective:  From home with fiance, pta indep, STEMI  s/p cath no stent intervention done, will treat medically, will be on brilinta.  He has no insurance ,no PCP.  NCM gave patient the 30 day savings coupon, patient assistance application for brilinta, brochure for follow up apt at the patient care center and brouchure for community health and wellness clinic where he can utilize the pharmacy for med ast for refill on brilinta for $10 and other meds.  NCM left message with French Anaracy in Financial Counseling to speak with patient about applying  For Medicaid.  NCM faxed brilinta patient ast form on 11/26/16.                 Action/Plan: NCM will follow for dc needs.   Expected Discharge Date:  11/28/16               Expected Discharge Plan:  Home/Self Care  In-House Referral:     Discharge planning Services  CM Consult, Follow-up appt scheduled, Indigent Health Clinic, Medication Assistance  Post Acute Care Choice:    Choice offered to:     DME Arranged:    DME Agency:     HH Arranged:    HH Agency:     Status of Service:  Completed, signed off  If discussed at [employer] Meetings, dates discussed:    Additional Comments:  Leone Havenaylor, [place] Haydon Clinton, RN 11/26/2016, 10:20 AM

## 2016-11-27 ENCOUNTER — Telehealth: Payer: Self-pay | Admitting: Physician Assistant

## 2016-11-27 DIAGNOSIS — I25110 Atherosclerotic heart disease of native coronary artery with unstable angina pectoris: Secondary | ICD-10-CM

## 2016-11-27 LAB — BASIC METABOLIC PANEL
ANION GAP: 6 (ref 5–15)
BUN: 11 mg/dL (ref 6–20)
CALCIUM: 8.8 mg/dL — AB (ref 8.9–10.3)
CO2: 28 mmol/L (ref 22–32)
Chloride: 105 mmol/L (ref 101–111)
Creatinine, Ser: 1 mg/dL (ref 0.61–1.24)
GFR calc Af Amer: >60 mL/min (ref >60)
GLUCOSE: 104 mg/dL — AB (ref 65–99)
Potassium: 3.9 mmol/L (ref 3.5–5.1)
Sodium: 139 mmol/L (ref 135–145)

## 2016-11-27 LAB — CBC
HCT: 44.2 % (ref 39.0–52.0)
HEMOGLOBIN: 14.4 g/dL (ref 13.0–17.0)
MCH: 27.9 pg (ref 26.0–34.0)
MCHC: 32.6 g/dL (ref 30.0–36.0)
MCV: 85.5 fL (ref 78.0–100.0)
PLATELETS: 125 10*3/uL — AB (ref 150–400)
RBC: 5.17 MIL/uL (ref 4.22–5.81)
RDW: 14.5 % (ref 11.5–15.5)
WBC: 6 10*3/uL (ref 4.0–10.5)

## 2016-11-27 MED ORDER — NITROGLYCERIN 0.4 MG SL SUBL: 0.4000 mg | SUBLINGUAL_TABLET | SUBLINGUAL | 2 refills | Status: DC | PRN

## 2016-11-27 MED ORDER — LISINOPRIL 20 MG PO TABS
20.0000 mg | ORAL_TABLET | Freq: Every day | ORAL | Status: DC
  Administered 2016-11-27: 20 mg via ORAL

## 2016-11-27 MED ORDER — METOPROLOL TARTRATE 25 MG PO TABS: 25.0000 mg | ORAL_TABLET | Freq: Two times a day (BID) | ORAL | 1 refills | Status: DC

## 2016-11-27 MED ORDER — ASPIRIN 81 MG PO CHEW: 81.0000 mg | CHEWABLE_TABLET | Freq: Every day | ORAL | Status: DC

## 2016-11-27 MED ORDER — ATORVASTATIN CALCIUM 80 MG PO TABS: 80.0000 mg | ORAL_TABLET | Freq: Every day | ORAL | 1 refills | Status: DC

## 2016-11-27 MED ORDER — LISINOPRIL 20 MG PO TABS: 20.0000 mg | ORAL_TABLET | Freq: Every day | ORAL | 1 refills | Status: DC

## 2016-11-27 MED ORDER — TICAGRELOR 90 MG PO TABS: 90.0000 mg | ORAL_TABLET | Freq: Two times a day (BID) | ORAL | 3 refills | Status: DC

## 2016-11-27 MED FILL — NITROGLYCERIN 0.4 MG TAB SL: 0.4 | 10 days supply | Qty: 25 | Fill #0

## 2016-11-27 MED FILL — LISINOPRIL 20 MG TABS: 20 | 90 days supply | Qty: 90 | Fill #0

## 2016-11-27 MED FILL — BRILINTA 90 MG TABLET: 90 | 30 days supply | Qty: 60 | Fill #0 | Status: TO

## 2016-11-27 MED FILL — ATORVASTATIN 80 MG TABLET: 80 | 90 days supply | Qty: 90 | Fill #0

## 2016-11-27 MED FILL — METOPROLOL TARTRATE 25 MG T: 25 | 30 days supply | Qty: 60 | Fill #0 | Status: TO

## 2016-11-27 NOTE — Progress Notes (Signed)
Patient slept all night. No complains or concerns. Stated he fells good and he is ready to go home. Will continue to monitor.   Jazziel Fitzsimmons, RN

## 2016-11-27 NOTE — Progress Notes (Signed)
Patient had 3.54 pause and right after that 3.98 pause per CCMD.Patient asymptomatic. VS stable. Per pt this was not first time, experienced this in the past. Will continue to monitor.  Ashlay Altieri, RN

## 2016-11-27 NOTE — Progress Notes (Signed)
Progress Note  Patient Name: Vincent [name] Date of Encounter: 11/27/2016  Primary Cardiologist: Allyson SabalBerry  Subjective   No chest pain or dyspnea.   Inpatient Medications    Scheduled Meds: . aspirin  81 mg Oral Daily  . atorvastatin  80 mg Oral q1800  . lisinopril  10 mg Oral Daily  . metoprolol tartrate  25 mg Oral BID  . ticagrelor  90 mg Oral BID   Continuous Infusions: . sodium chloride     PRN Meds: sodium chloride, acetaminophen, hydrALAZINE, ondansetron (ZOFRAN) IV, promethazine, sodium chloride flush   Vital Signs    Vitals:   11/26/16 2001 11/26/16 2138 11/27/16 0031 11/27/16 0332  BP: (!) 160/90 (!) 143/83 (!) 142/90 (!) 150/97  Pulse: 68 75 61 68  Resp: 18  18 18   Temp: 98.3 F (36.8 C)  98.2 F (36.8 C) 97.9 F (36.6 C)  TempSrc: Oral  Oral Oral  SpO2: 100% 99% 100% 100%  Weight:    233 lb (105.7 kg)  Height:        Intake/Output Summary (Last 24 hours) at 11/27/2016 0750 Last data filed at 11/27/2016 0558 Gross per 24 hour  Intake 170.5 ml  Output 350 ml  Net -179.5 ml   Filed Weights   11/26/16 0500 11/26/16 1745 11/27/16 0332  Weight: 236 lb 1.8 oz (107.1 kg) 233 lb 3 oz (105.8 kg) 233 lb (105.7 kg)    Telemetry    Sinus - Personally Reviewed  ECG    Sinus brady, non-specific T wave abn - Personally Reviewed  Physical Exam    General: Well developed, well nourished, NAD  HEENT: OP clear, mucus membranes moist  SKIN: warm, dry. No rashes. Neuro: No focal deficits  Musculoskeletal: Muscle strength 5/5 all ext  Psychiatric: Mood and affect normal  Neck: No JVD, no carotid bruits, no thyromegaly, no lymphadenopathy.  Lungs:Clear bilaterally, no wheezes, rhonci, crackles Cardiovascular: Regular rate and rhythm. No murmurs, gallops or rubs. Abdomen:Soft. Bowel sounds present. Non-tender.  Extremities: No lower extremity edema. Pulses are 2 + in the bilateral DP/PT.    Labs    Chemistry Recent Labs  Lab 11/25/16 1750   11/26/16 0128 11/26/16 0705 11/27/16 0602  NA 139   < > 137 139 139  K 2.9*   < > 4.1 4.3 3.9  CL 103   < > 105 104 105  CO2 31  --  28 28 28   GLUCOSE 175*   < > 137* 141* 104*  BUN 16   < > 10 8 11   CREATININE 1.13   < > 0.96 1.11 1.00  CALCIUM 9.0  --  8.8* 8.7* 8.8*  PROT 7.4  --   --   --   --   ALBUMIN 4.0  --   --   --   --   AST 28  --   --   --   --   ALT 18  --   --   --   --   ALKPHOS 51  --   --   --   --   BILITOT 0.3  --   --   --   --   GFRNONAA >60  --  >60 >60 >60  GFRAA >60  --  >60 >60 >60  ANIONGAP 5  --  4* 7 6   < > = values in this interval not displayed.     Hematology Recent Labs  Lab 11/25/16 1750 11/25/16 1912 11/26/16 0145  11/27/16 0602  WBC 7.3  --  6.4 6.0  RBC 5.20  --  5.09 5.17  HGB 14.7 14.6 14.8 14.4  HCT 42.7 43.0 43.0 44.2  MCV 82.1  --  84.5 85.5  MCH 28.3  --  29.1 27.9  MCHC 34.4  --  34.4 32.6  RDW 14.0  --  14.5 14.5  PLT 145*  --  138* 125*    Cardiac Enzymes Recent Labs  Lab 11/25/16 1750 11/25/16 2006 11/26/16 0128 11/26/16 0705  TROPONINI 0.03* 0.60* 5.00* 3.72*   No results for input(s): TROPIPOC in the last 168 hours.   BNPNo results for input(s): BNP, PROBNP in the last 168 hours.   DDimer No results for input(s): DDIMER in the last 168 hours.   Radiology    Dg Chest Port 1 View  Result Date: 11/25/2016 CLINICAL DATA:  Left-sided chest pain EXAM: PORTABLE CHEST 1 VIEW COMPARISON:  None. FINDINGS: The heart size and mediastinal contours are within normal limits. Both lungs are clear. The visualized skeletal structures are unremarkable. IMPRESSION: No active disease. Electronically Signed   By: Alcide CleverMark  Lukens M.D.   On: 11/25/2016 18:19    Cardiac Studies   [facility]  Ost 1st Diag lesion is 60% stenosed.  Ost 1st Diag to 1st Diag lesion is 90% stenosed.  Prox LAD lesion is 50% stenosed.  Ost LM lesion is 40% stenosed.  Prox RCA to Mid RCA lesion is 40% stenosed.  The left ventricular systolic  function is normal.  LV end diastolic pressure is normal.  The left ventricular ejection fraction is greater than 65% by visual estimate.  Echo 11/26/16: - Left ventricle: The cavity size was normal. Systolic function was   normal. The estimated ejection fraction was in the range of 55%   to 60%. Probable mild hypokinesis of the apical lateral   myocardium; consistent with ischemia in the distribution of ramus   intermedius or diagonal coronary artery. Doppler parameters are   consistent with abnormal left ventricular relaxation (grade 1   diastolic dysfunction).  Patient Profile     61 y.o. male with PMH of HTN, HLD who presented with EKG consistent STEMI on 11/18 and [facility]hrs[facility], which revealed narrowing of D1 but with good distal flow - no lesions required intervention.  Assessment & Plan    1. CAD with unstable angina: Pt presented with ST elevation. Cath with severe Diagonal stenosis but small vessel and no PCI performed. He has been chest pain free. Plan medical management of CAD only. Will continue ASA, Brilinta, statin, beta blocker and Ace-inh. LV function normal by echo  2. HTN: BP still elevated. Will increase Lisinopril to 20 mg daily.   D/C home today. Follow up with Dr. Allyson SabalBerry. [facility]. BMET in one week at time of follow up.   For questions or updates, please contact [facility] Please consult [email or web address] for contact info under Cardiology/STEMI.   Signed, Verne Carrowhristopher McAlhany, MD  11/27/2016, 7:50 AM

## 2016-11-27 NOTE — Progress Notes (Signed)
Patient was seen by Stanton Kidneyebra RN CM prior to arriving to [facility] / Financial Counselor called to see the patient today to see what he might qualify for. Abelino DerrickB Elham Fini Mclaren Bay RegionalRN,MHA,BSN [phone]

## 2016-11-27 NOTE — Telephone Encounter (Signed)
New Message      [facility]  12/06/16 1030a with Theodore Demarkhonda Barrett

## 2016-11-27 NOTE — Telephone Encounter (Signed)
[facility] on 11/25/16.  Patient understands to follow up with provider R. BARRETT on 12/06/16 at 10:[facility] Patient understands discharge instructions? yes  Patient understands medications and regiment? yes  Patient understands to bring all medications to this visit? yes

## 2016-11-27 NOTE — Progress Notes (Addendum)
Patient was walking down the hall with his significant other early in the morning.  Few minutes later nurse checked hallway and the patient's room and could not locate patient or significant other. Checked whole third, second, and ground  floor with charge nurse but could not locate the patient. This nurse was notified by another staff member that patient was in the chapel on the third floor along with significant other. Nurse informed and educated patient that he is more than welcome to walk but he is not allowed to leave the unit especially without not telling nurse or other staff members that he is leaving. .  Patient verbalised  understanding.  Will contine monitor.  Pennelope Basque, RN

## 2016-11-27 NOTE — Progress Notes (Signed)
Cardiology MD Electa SniffBarnett notified about heart pause.  No new orders.  Will continue to monitor.  Laquan Beier, RN

## 2016-11-27 NOTE — Discharge Summary (Signed)
Discharge Summary    Patient ID: Vincent [name],  MRN: [id number], DOB/AGE: 02/19/1955 61 y.o.  Admit date: 11/25/2016 Discharge date: 11/27/2016  Primary Care Provider: Patient, No Pcp Per Primary Cardiologist: Allyson Sabal   Discharge Diagnoses    Active Problems:   ST elevation myocardial infarction (STEMI) of lateral wall (HCC)   ST elevation myocardial infarction (STEMI) [facility])   Essential hypertension   Hyperlipidemia   Hypokalemia   Allergies No Known Allergies  Diagnostic Studies/Procedures    [facility] 11/25/16  Ost 1st Diag lesion is 60% stenosed.  Ost 1st Diag to 1st Diag lesion is 90% stenosed.  Prox LAD lesion is 50% stenosed.  Ost LM lesion is 40% stenosed.  Prox RCA to Mid RCA lesion is 40% stenosed.  The left ventricular systolic function is normal.  LV end diastolic pressure is normal.  The left ventricular ejection fraction is greater than 65% by visual estimate.  Echo 11/26/16: - Left ventricle: The cavity size was normal. Systolic function was normal. The estimated ejection fraction was in the range of 55% to 60%. Probable mild hypokinesis of the apical lateral myocardium; consistent with ischemia in the distribution of ramus intermedius or diagonal coronary artery. Doppler parameters are consistent with abnormal left ventricular relaxation (grade 1 diastolic dysfunction).   _____________   History of Present Illness     61 yo man with PMH hypertension, hyperlipidemia who presented to ER with two hours of chest pain. He was diagnosed as STEMI and transferred to [facility] for emergent cath. The patient reports about two hours prior to presentation he developed right and left sided chest pressure that became severe, associated with diaphoresis and nausea. The pain occurred while he was driving. No prior similar symptoms. No known CAD. On arrival to the ER he was treated with aspirin 324 mg and heparin 4000 units, followed by a heparin  drip. He did not receive nitroglycerin as he endorsed recent PDE inhibitor use. His initial ECG was concerning for ST elevations in leads 1 and aVL with reciprocal inferior depressions. Code STEMI was called. His chest pain resolved while in the ER.  Hospital Course     He was taken directly to the cath lab and underwent emergent cardiac cath with Dr. Allyson Sabal noted above with severe Diagonal stenosis but small vessel and no PCI performed. Plan medical management of CAD only. Medications included ASA, Brilinta, statin, beta blocker and Ace-inh. LV function normal by echo. Troponin peaked at 5. LDL 129, and Hgb A1c 5.8. Worked well with cardiac rehab post cath. Labs showed Cr 1.00 and Hgb 14.4 post cath.    Vincent [name] was seen by Dr. Clifton James and determined stable for discharge home. Follow up in the office has been arranged. Medications are listed below.   _____________  Discharge Vitals Blood pressure (!) 148/94, pulse 68, temperature 98.4 F (36.9 C), temperature source Oral, resp. rate 18, height 5\' 11"  (1.803 m), weight 233 lb (105.7 kg), SpO2 100 %.  Filed Weights   11/26/16 0500 11/26/16 1745 11/27/16 0332  Weight: 236 lb 1.8 oz (107.1 kg) 233 lb 3 oz (105.8 kg) 233 lb (105.7 kg)    Labs & Radiologic Studies    CBC Recent Labs    11/25/16 1750  11/26/16 0145 11/27/16 0602  WBC 7.3  --  6.4 6.0  NEUTROABS 3.2  --   --   --   HGB 14.7   < > 14.8 14.4  HCT 42.7   < >  43.0 44.2  MCV 82.1  --  84.5 85.5  PLT 145*  --  138* 125*   < > = values in this interval not displayed.   Basic Metabolic Panel Recent Labs    60/45/4011/19/18 0705 11/27/16 0602  NA 139 139  K 4.3 3.9  CL 104 105  CO2 28 28  GLUCOSE 141* 104*  BUN 8 11  CREATININE 1.11 1.00  CALCIUM 8.7* 8.8*  MG 1.9  --    Liver Function Tests Recent Labs    11/25/16 1750  AST 28  ALT 18  ALKPHOS 51  BILITOT 0.3  PROT 7.4  ALBUMIN 4.0   No results for input(s): LIPASE, AMYLASE in the last 72 hours. Cardiac  Enzymes Recent Labs    11/25/16 2006 11/26/16 0128 11/26/16 0705  TROPONINI 0.60* 5.00* 3.72*   BNP Invalid input(s): POCBNP D-Dimer No results for input(s): DDIMER in the last 72 hours. Hemoglobin A1C Recent Labs    11/26/16 0145  HGBA1C 5.8*   Fasting Lipid Panel Recent Labs    11/26/16 0145  CHOL 206*  HDL 52  LDLCALC 129*  TRIG 123  CHOLHDL 4.0   Thyroid Function Tests No results for input(s): TSH, T4TOTAL, T3FREE, THYROIDAB in the last 72 hours.  Invalid input(s): FREET3 _____________  Dg Chest Port 1 View  Result Date: 11/25/2016 CLINICAL DATA:  Left-sided chest pain EXAM: PORTABLE CHEST 1 VIEW COMPARISON:  None. FINDINGS: The heart size and mediastinal contours are within normal limits. Both lungs are clear. The visualized skeletal structures are unremarkable. IMPRESSION: No active disease. Electronically Signed   By: Alcide CleverMark  Lukens M.D.   On: 11/25/2016 18:19   Disposition   Pt is being discharged home today in good condition.  Follow-up Plans & Appointments    Follow-up Information    [facility] Follow up on 12/17/2016.   Why:  9 am for hospital follow up,  Contact information: [address] [id number] [place] [id number] [phone]       [facility] Follow up.   Why:  you can utilitize  this clinic to get medication asst with brilinta and other medications Contact information: [address] [place] [phone]       Darrol JumpBarrett, Rhonda G, PA-C Follow up on 12/06/2016.   Specialties:  Cardiology, Radiology Why:  at 10:30am for your follow up appt.  Contact information: [address] [place] [id number] [phone]          Discharge Instructions    Call MD for:  redness, tenderness, or signs of infection (pain, swelling, redness, odor or green/yellow discharge around incision site)   Complete by:  As directed     Diet - low sodium heart healthy   Complete by:  As directed    Discharge instructions   Complete by:  As directed    Radial Site Care Refer to this sheet in the next few weeks. These instructions provide you with information on caring for yourself after your procedure. Your caregiver may also give you more specific instructions. Your treatment has been planned according to current medical practices, but problems sometimes occur. Call your caregiver if you have any problems or questions after your procedure. HOME CARE INSTRUCTIONS You may shower the day after the procedure.Remove the bandage (dressing) and gently wash the site with plain soap and water.Gently pat the site dry.  Do not apply powder or lotion to  the site.  Do not submerge the affected site in water for 3 to 5 days.  Inspect the site at least twice daily.  Do not flex or bend the affected arm for 24 hours.  No lifting over 5 pounds (2.3 kg) for 5 days after your procedure.  Do not drive home if you are discharged the same day of the procedure. Have someone else drive you.  You may drive 24 hours after the procedure unless otherwise instructed by your caregiver.  What to expect: Any bruising will usually fade within 1 to 2 weeks.  Blood that collects in the tissue (hematoma) may be painful to the touch. It should usually decrease in size and tenderness within 1 to 2 weeks.  SEEK IMMEDIATE MEDICAL CARE IF: You have unusual pain at the radial site.  You have redness, warmth, swelling, or pain at the radial site.  You have drainage (other than a small amount of blood on the dressing).  You have chills.  You have a fever or persistent symptoms for more than 72 hours.  You have a fever and your symptoms suddenly get worse.  Your arm becomes pale, cool, tingly, or numb.  You have heavy bleeding from the site. Hold pressure on the site.   Please monitor your blood pressure at home, keep a record and bring to your follow up appt.    Increase activity slowly   Complete by:  As directed       Discharge Medications     Medication List    STOP taking these medications   UNKNOWN TO PATIENT     TAKE these medications   aspirin 81 MG chewable tablet Chew 1 tablet (81 mg total) by mouth daily. Start taking on:  11/28/2016   atorvastatin 80 MG tablet Commonly known as:  LIPITOR Take 1 tablet (80 mg total) by mouth daily at 6 PM.   lisinopril 20 MG tablet Commonly known as:  PRINIVIL,ZESTRIL Take 1 tablet (20 mg total) by mouth daily. Start taking on:  11/28/2016   metoprolol tartrate 25 MG tablet Commonly known as:  LOPRESSOR Take 1 tablet (25 mg total) by mouth 2 (two) times daily.   nitroGLYCERIN 0.4 MG SL tablet Commonly known as:  NITROSTAT Place 1 tablet (0.4 mg total) under the tongue every 5 (five) minutes as needed.   ticagrelor 90 MG Tabs tablet Commonly known as:  BRILINTA Take 1 tablet (90 mg total) by mouth 2 (two) times daily.        Aspirin prescribed at discharge?  Yes High Intensity Statin Prescribed? (Lipitor 40-80mg  or Crestor 20-40mg ): Yes Beta Blocker Prescribed? Yes For EF <40%, was ACEI/ARB Prescribed? Yes ADP Receptor Inhibitor Prescribed? (i.e. Plavix etc.-Includes Medically Managed Patients): Yes For EF <40%, Aldosterone Inhibitor Prescribed? No: EF ok Was EF assessed during THIS hospitalization? Yes Was Cardiac Rehab II ordered? (Included Medically managed Patients): Yes   Outstanding Labs/Studies   LFTs/ FLP in 6 weeks if tolerating statin. BMET at follow up appt.   Duration of Discharge Encounter   Greater than 30 minutes including physician time.  Signed, Laverda PageLindsay Azyria Osmon NP-C 11/27/2016, 10:14 AM

## 2016-11-27 NOTE — Progress Notes (Signed)
Pt discharged home, IV and tele removed. Discharged instructions given.  

## 2016-12-04 ENCOUNTER — Other Ambulatory Visit: Payer: Self-pay | Admitting: *Deleted

## 2016-12-04 MED ORDER — TICAGRELOR 90 MG PO TABS: 90.0000 mg | ORAL_TABLET | Freq: Two times a day (BID) | ORAL | 3 refills | Status: DC

## 2016-12-06 ENCOUNTER — Encounter: Payer: Self-pay | Admitting: Physician Assistant

## 2016-12-06 ENCOUNTER — Ambulatory Visit (INDEPENDENT_AMBULATORY_CARE_PROVIDER_SITE_OTHER): Payer: Self-pay | Admitting: Physician Assistant

## 2016-12-06 VITALS — BP 144/86 | HR 62 | Ht 71.0 in | Wt 238.4 lb

## 2016-12-06 DIAGNOSIS — I10 Essential (primary) hypertension: Secondary | ICD-10-CM

## 2016-12-06 DIAGNOSIS — N522 Drug-induced erectile dysfunction: Secondary | ICD-10-CM

## 2016-12-06 DIAGNOSIS — E785 Hyperlipidemia, unspecified: Secondary | ICD-10-CM

## 2016-12-06 DIAGNOSIS — R0683 Snoring: Secondary | ICD-10-CM

## 2016-12-06 DIAGNOSIS — I213 ST elevation (STEMI) myocardial infarction of unspecified site: Secondary | ICD-10-CM

## 2016-12-06 MED ORDER — LISINOPRIL 40 MG PO TABS: 40.0000 mg | ORAL_TABLET | Freq: Every day | ORAL | 3 refills | Status: DC

## 2016-12-06 MED ORDER — METOPROLOL TARTRATE 25 MG PO TABS: 12.5000 mg | ORAL_TABLET | Freq: Two times a day (BID) | ORAL | 1 refills | Status: DC

## 2016-12-06 NOTE — Progress Notes (Signed)
Cardiology Office Note   Date:  12/06/2016   ID:  [name], DOB 1955-03-24, MRN [id number]  PCP:  Patient, No Pcp Per  Cardiologist: Dr. William Hamburger, PA-C   Chief Complaint  Patient presents with  . Follow-up  . Shortness of Breath    History of Present Illness: Vincent [name] is a 61 y.o. male with a history of HTN, HLD  Admit 11/18>>11/20 w/ STEMI, small vessel disease at cath>>medical therapy.  EF normal.  Vincent [name] presents for cardiology follow up.  Vincent [name] is present.  He has not had chest pain since he left the hospital. He has been walking up some stairs, gets SOB with this.  He has not been exercising, wants to start.   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 He wants to go back to work.   He is a heavy snorer and his fiance has heard him quit breathing at times.   Has had some erectile dysfunction since discharge from the hospital.  His fianc will help him remember to take his medications and eat a heart healthy diet.   Past Medical History:  Diagnosis Date  . Hyperlipidemia   . Hypertension   . STEMI (ST elevation myocardial infarction) (HCC) 11/25/2016   small vessel dz at cath, med rx, nl EF    Past Surgical History:  Procedure Laterality Date  . LEFT HEART CATH AND CORONARY ANGIOGRAPHY N/A 11/25/2016   Procedure: LEFT HEART CATH AND CORONARY ANGIOGRAPHY;  Surgeon: Runell Gess, MD;  Location: [facility];  Service: Cardiovascular;  Laterality: N/A;    Current Outpatient Medications  Medication Sig Dispense Refill  . aspirin 81 MG chewable tablet Chew 1 tablet (81 mg total) by mouth daily.    Marland Kitchen atorvastatin (LIPITOR) 80 MG tablet Take 1 tablet (80 mg total) by mouth daily at 6 PM. 90 tablet 1  . lisinopril (PRINIVIL,ZESTRIL) 20 MG tablet Take 1 tablet (20 mg total) by mouth daily. 90 tablet 1  . metoprolol tartrate (LOPRESSOR) 25 MG tablet Take 1 tablet (25 mg total) by mouth 2 (two) times  daily. 60 tablet 1  . nitroGLYCERIN (NITROSTAT) 0.4 MG SL tablet Place 1 tablet (0.4 mg total) under the tongue every 5 (five) minutes as needed. 25 tablet 2  . ticagrelor (BRILINTA) 90 MG TABS tablet Take 1 tablet (90 mg total) by mouth 2 (two) times daily. 180 tablet 3   No current facility-administered medications for this visit.     Allergies:   Patient has no known allergies.    Social History:  The patient  reports that  has never smoked. he has never used smokeless tobacco. He reports that he does not drink alcohol or use drugs.   Family History:  The patient's family history includes AAA (abdominal aortic aneurysm) (age of onset: 50) in his maternal grandmother and paternal grandmother; CAD (age of onset: 37) in his brother; CAD (age of onset: 13) in his father; Cancer (age of onset: 23) in his mother.    ROS:  Please see the history of present illness. All other systems are reviewed and negative.    PHYSICAL EXAM: VS:  BP (!) 144/86   Pulse 62   Ht 5\' 11"  (1.803 m)   Wt 238 lb 6.4 oz (108.1 kg)   BMI 33.25 kg/m  , BMI Body mass index is 33.25 kg/m. GEN: Well nourished, well developed, male in no acute distress  HEENT:  normal for age  Neck: no JVD, no carotid bruit, no masses Cardiac: RRR; no murmur, no rubs, or gallops Respiratory:  clear to auscultation bilaterally, normal work of breathing GI: soft, nontender, nondistended, + BS MS: no deformity or atrophy; no edema; distal pulses are 2+ in all 4 extremities   Skin: warm and dry, no rash Neuro:  Strength and sensation are intact Psych: euthymic mood, full affect   EKG:  EKG is ordered today. The ekg ordered today demonstrates sinus rhythm heart rate 62, anterolateral T wave inversions consistent with previous MI, T wave inversions seen in more leads than on 11/20 ECG  LHC 11/25/16  Ost 1st Diag lesion is 60% stenosed.  Ost 1st Diag to 1st Diag lesion is 90% stenosed.  Prox LAD lesion is 50% stenosed.  Ost  LM lesion is 40% stenosed.  Prox RCA to Mid RCA lesion is 40% stenosed.  The left ventricular systolic function is normal.  LV end diastolic pressure is normal.  The left ventricular ejection fraction is greater than 65% by visual estimate.  Echo 11/26/16: - Left ventricle: The cavity size was normal. Systolic function was normal. The estimated ejection fraction was in the range of 55% to 60%. Probable mild hypokinesis of the apical lateral myocardium; consistent with ischemia in the distribution of ramus intermedius or diagonal coronary artery. Doppler parameters are consistent with abnormal left ventricular relaxation (grade 1 diastolic dysfunction).   Recent Labs: 11/25/2016: ALT 18 11/26/2016: Magnesium 1.9 11/27/2016: BUN 11; Creatinine, Ser 1.00; Hemoglobin 14.4; Platelets 125; Potassium 3.9; Sodium 139    Lipid Panel    Component Value Date/Time   CHOL 206 (H) 11/26/2016 0145   TRIG 123 11/26/2016 0145   HDL 52 11/26/2016 0145   CHOLHDL 4.0 11/26/2016 0145   VLDL 25 11/26/2016 0145   LDLCALC 129 (H) 11/26/2016 0145     Wt Readings from Last 3 Encounters:  12/06/16 238 lb 6.4 oz (108.1 kg)  11/27/16 233 lb (105.7 kg)     Other studies Reviewed: Additional studies/ records that were reviewed today include: Hospital records and testing.  ASSESSMENT AND PLAN:  1.  S/p STEMI w/ med rx for small vessel disease: His job will not allow him to do cardiac rehab, but he has a [employer] membership and can go in the evenings.  I have encouraged him to start a walking program with 5 minutes at a time and increase as tolerated up to 30 minutes.  He was formerly exercising much more strenuously.  As he will not be able to attend cardiac rehab, I have encouraged him to be careful about increasing his activity and not try to do too much too soon.  Continue baby aspirin, BB, Brilinta and ACE inhibitor.  2.  Erectile dysfunction: Advised that I really needed to keep him  on the beta-blocker for at least 3 months.  However, I agreed to decrease the dose down to 12.5 mg twice daily.  He is to let Dr. Allyson SabalBerry know if the problem does not resolve.  3.  Hypertension: He needs better blood pressure control.  He has taken his blood pressure medications this morning.  Increase the lisinopril up to 40 mg a day since the beta-blocker is being decreased.  4.  Hyperlipidemia: His goal LDL is now less than 70. Continue high dose statin.  5.  Sleep apnea: Has signs and symptoms of apnea and this has been observed by his fiance.  We will schedule a sleep study.  Current medicines are reviewed at length with the patient today.  The patient has concerns regarding medicines. Concerns were addressed The following changes have been made: Decrease metoprolol, increase lisinopril  Labs/ tests ordered today include:   Orders Placed This Encounter  Procedures  . EKG 12-Lead  . Nocturnal polysomnography (NPSG)     Disposition:   FU with Dr. Allyson SabalBerry  Signed, Theodore Demarkhonda Barrett, PA-C  12/06/2016 2:11 PM    [facility] Phone: [phone]; Fax: [phone]) [phone]  This note was written with the assistance of speech recognition software. Please excuse any transcriptional errors.

## 2016-12-06 NOTE — Patient Instructions (Signed)
Your physician has recommended you make the following change in your medication: DECREASE METOPROLOL TO 12.5 MG TWICE DAILY  INCREASE LISINOPRIL TO 40 MG EVERY DAY   Your physician has recommended that you have a sleep study. This test records several body functions during sleep, including: brain activity, eye movement, oxygen and carbon dioxide blood levels, heart rate and rhythm, breathing rate and rhythm, the flow of air through your mouth and nose, snoring, body muscle movements, and chest and belly movement. MONITOR B/P OKAY IF TOP NUMBER GETS AS LOW AS 100-110  START A WALKING PROGRAM AT 5 MINUTES TWICE A DAY  AND SLOWLY INCREASE AS TOLERATED  Your physician recommends that you schedule a follow-up appointment in:  3 MONTHS WITH DR Allyson Sabal   Heart-Healthy Eating Plan Heart-healthy meal planning includes:  Limiting unhealthy fats.  Increasing healthy fats.  Making other small dietary changes.  You may need to talk with your doctor or a diet specialist (dietitian) to create an eating plan that is right for you. What types of fat should I choose?  Choose healthy fats. These include olive oil and canola oil, flaxseeds, walnuts, almonds, and seeds.  Eat more omega-3 fats. These include salmon, mackerel, sardines, tuna, flaxseed oil, and ground flaxseeds. Try to eat fish at least twice each week.  Limit saturated fats. ? Saturated fats are often found in animal products, such as meats, butter, and cream. ? Plant sources of saturated fats include palm oil, palm kernel oil, and coconut oil.  Avoid foods with partially hydrogenated oils in them. These include stick margarine, some tub margarines, cookies, crackers, and other baked goods. These contain trans fats. What general guidelines do I need to follow?  Check food labels carefully. Identify foods with trans fats or high amounts of saturated fat.  Fill one half of your plate with vegetables and green salads. Eat 4-5 servings of  vegetables per day. A serving of vegetables is: ? 1 cup of raw leafy vegetables. ?  cup of raw or cooked cut-up vegetables. ?  cup of vegetable juice.  Fill one fourth of your plate with whole grains. Look for the word "whole" as the first word in the ingredient list.  Fill one fourth of your plate with lean protein foods.  Eat 4-5 servings of fruit per day. A serving of fruit is: ? One medium whole fruit. ?  cup of dried fruit. ?  cup of fresh, frozen, or canned fruit. ?  cup of 100% fruit juice.  Eat more foods that contain soluble fiber. These include apples, broccoli, carrots, beans, peas, and barley. Try to get 20-30 g of fiber per day.  Eat more home-cooked food. Eat less restaurant, buffet, and fast food.  Limit or avoid alcohol.  Limit foods high in starch and sugar.  Avoid fried foods.  Avoid frying your food. Try baking, boiling, grilling, or broiling it instead. You can also reduce fat by: ? Removing the skin from poultry. ? Removing all visible fats from meats. ? Skimming the fat off of stews, soups, and gravies before serving them. ? Steaming vegetables in water or broth.  Lose weight if you are overweight.  Eat 4-5 servings of nuts, legumes, and seeds per week: ? One serving of dried beans or legumes equals  cup after being cooked. ? One serving of nuts equals 1 ounces. ? One serving of seeds equals  ounce or one tablespoon.  You may need to keep track of how much salt or sodium  you eat. This is especially true if you have high blood pressure. Talk with your doctor or dietitian to get more information. What foods can I eat? Grains Breads, including [place], white, pita, wheat, raisin, rye, oatmeal, and [place]. Tortillas that are neither fried nor made with lard or trans fat. Low-fat rolls, including hotdog and hamburger buns and English muffins. Biscuits. Muffins. Waffles. Pancakes. Light popcorn. Whole-grain cereals. Flatbread. Melba toast. Pretzels.  Breadsticks. Rusks. Low-fat snacks. Low-fat crackers, including oyster, saltine, matzo, graham, animal, and rye. Rice and pasta, including brown rice and pastas that are made with whole wheat. Vegetables All vegetables. Fruits All fruits, but limit coconut. Meats and Other Protein Sources Lean, well-trimmed beef, veal, pork, and lamb. Chicken and [place] without skin. All fish and shellfish. Wild duck, rabbit, pheasant, and venison. Egg whites or low-cholesterol egg substitutes. Dried beans, peas, lentils, and tofu. Seeds and most nuts. Dairy Low-fat or nonfat cheeses, including ricotta, string, and mozzarella. Skim or 1% milk that is liquid, powdered, or evaporated. Buttermilk that is made with low-fat milk. Nonfat or low-fat yogurt. Beverages Mineral water. Diet carbonated beverages. Sweets and Desserts Sherbets and fruit ices. Honey, jam, marmalade, jelly, and syrups. Meringues and gelatins. Pure sugar candy, such as hard candy, jelly beans, gumdrops, mints, marshmallows, and small amounts of dark chocolate. MGM MIR[employer]el food cake. Eat all sweets and desserts in moderation. Fats and Oils Nonhydrogenated (trans-free) margarines. Vegetable oils, including soybean, sesame, sunflower, olive, peanut, safflower, corn, canola, and cottonseed. Salad dressings or mayonnaise made with a vegetable oil. Limit added fats and oils that you use for cooking, baking, salads, and as spreads. Other Cocoa powder. Coffee and tea. All seasonings and condiments. The items listed above may not be a complete list of recommended foods or beverages. Contact your dietitian for more options. What foods are not recommended? Grains Breads that are made with saturated or trans fats, oils, or whole milk. Croissants. Butter rolls. Cheese breads. Sweet rolls. Donuts. Buttered popcorn. Chow mein noodles. High-fat crackers, such as cheese or butter crackers. Meats and Other Protein Sources Fatty meats, such as hotdogs, short ribs,  sausage, spareribs, bacon, rib eye roast or steak, and mutton. High-fat deli meats, such as salami and bologna. Caviar. Domestic duck and goose. Organ meats, such as kidney, liver, sweetbreads, and heart. Dairy Cream, sour cream, cream cheese, and creamed cottage cheese. Whole-milk cheeses, including blue (bleu), [address], [place]rie, [place], [address], [place], [address], cheddar, [place]embert, and [place]. Whole or 2% milk that is liquid, evaporated, or condensed. Whole buttermilk. Cream sauce or high-fat cheese sauce. Yogurt that is made from whole milk. Beverages Regular sodas and juice drinks with added sugar. Sweets and Desserts Frosting. Pudding. Cookies. Cakes other than angel food cake. Candy that has milk chocolate or white chocolate, hydrogenated fat, butter, coconut, or unknown ingredients. Buttered syrups. Full-fat ice cream or ice cream drinks. Fats and Oils Gravy that has suet, meat fat, or shortening. Cocoa butter, hydrogenated oils, palm oil, coconut oil, palm kernel oil. These can often be found in baked products, candy, fried foods, nondairy creamers, and whipped toppings. Solid fats and shortenings, including bacon fat, salt pork, lard, and butter. Nondairy cream substitutes, such as coffee creamers and sour cream substitutes. Salad dressings that are made of unknown oils, cheese, or sour cream. The items listed above may not be a complete list of foods and beverages to avoid. Contact your dietitian for more information. This information is not intended to replace advice given to you by your health care  provider. Make sure you discuss any questions you have with your health care provider. Document Released: 06/26/2011 Document Revised: 06/02/2015 Document Reviewed: 06/18/2013 Elsevier Interactive Patient Education  [employer]2018 Elsevier Inc.

## 2016-12-12 MED FILL — LISINOPRIL 40 MG TABS: 40 | 90 days supply | Qty: 90 | Fill #0

## 2016-12-14 ENCOUNTER — Encounter: Payer: Self-pay | Admitting: Family Medicine

## 2016-12-14 ENCOUNTER — Ambulatory Visit (INDEPENDENT_AMBULATORY_CARE_PROVIDER_SITE_OTHER): Payer: Self-pay | Admitting: Family Medicine

## 2016-12-14 VITALS — BP 140/88 | HR 73 | Temp 97.8°F | Resp 16 | Ht 71.0 in | Wt 237.0 lb

## 2016-12-14 DIAGNOSIS — I2129 ST elevation (STEMI) myocardial infarction involving other sites: Secondary | ICD-10-CM

## 2016-12-14 DIAGNOSIS — E7849 Other hyperlipidemia: Secondary | ICD-10-CM

## 2016-12-14 DIAGNOSIS — I10 Essential (primary) hypertension: Secondary | ICD-10-CM

## 2016-12-14 DIAGNOSIS — R7303 Prediabetes: Secondary | ICD-10-CM

## 2016-12-14 MED ORDER — LISINOPRIL 40 MG PO TABS: 40.0000 mg | ORAL_TABLET | Freq: Every day | ORAL | 3 refills | Status: DC

## 2016-12-14 MED ORDER — TICAGRELOR 90 MG PO TABS: 90.0000 mg | ORAL_TABLET | Freq: Two times a day (BID) | ORAL | 3 refills | Status: DC

## 2016-12-14 MED ORDER — ATORVASTATIN CALCIUM 80 MG PO TABS: 80.0000 mg | ORAL_TABLET | Freq: Every day | ORAL | 1 refills | Status: DC

## 2016-12-14 MED ORDER — NITROGLYCERIN 0.4 MG SL SUBL: 0.4000 mg | SUBLINGUAL_TABLET | SUBLINGUAL | 2 refills | Status: DC | PRN

## 2016-12-14 MED ORDER — ASPIRIN 81 MG PO CHEW: 81.0000 mg | CHEWABLE_TABLET | Freq: Every day | ORAL | 1 refills | Status: DC

## 2016-12-14 MED ORDER — METOPROLOL TARTRATE 25 MG PO TABS: 12.5000 mg | ORAL_TABLET | Freq: Two times a day (BID) | ORAL | 1 refills | Status: DC

## 2016-12-14 NOTE — Progress Notes (Signed)
Vincent [name], is a 61 y.o. male  [id number]  [id number]  DOB - 07/30/1955  Chief Complaint  Patient presents with  . Heart Problem  . Hypertension  . Hospitalization Follow-up     HPI: Vincent CaulJohnny [name] is a 61 y.o. male is here today to establish care. Medical problems significant for STEMI (11/2016), hyperlipidemia, and hypertension.   Hospital Course:   On 11/25/2016 [name] [facility] a complaint of chest pain with associated diaphoresis and nausea.  He was found to actively experiencing a STEMI.  He was treated with aspirin and placed on heparin drip immediately upon arrival to the ER.  He was hypertensive on arrival.  He was taken to the Cath Lab and underwent an emergent heart cath which resulted in no interventions.  EF was stable at 65% on 11/25/16. Subsequent echocardiogram 11/26/16, revealed grade 1 diastolic dysfunction and EF ranging 55-60%. Cardiology recommended medication management. Remainder of hospital course was uncomplicated. He was discharged on the following classes of medications ASA, Brilinta, statin, beta blocker and Ace-inhibitor.    Chronic conditions: Today, [name] reports that he has remained free of chest pain, no uses of nitroglycerin, shortness of breath, or dizziness. He has remained compliant with medications. His only complaint is occasional flushing feeling accompanies by sweating. This has occurred a few times regardless of activity. To his knowledge, episodes are occurring around the time he takes his medications. He is not experiencing any unexpected reactions when taking his newly prescribed medications. He has not resumed an physical activity regimen. Monitors BP at home and it is occasionally elevated greater than 150 systolic. Recent, cardiologist increased lisinopril 40 mg to gain better control of blood pressure. Continues effort to adhere to DASH diet. During hospitalization he was found to be prediabetic with A1C  5.8. Prior to recent hospitalization, he received no routine medication care.    No Known Allergies Past Medical History:  Diagnosis Date  . Hyperlipidemia   . Hypertension   . STEMI (ST elevation myocardial infarction) (HCC) 11/25/2016   small vessel dz at cath, med rx, nl EF   Current Outpatient Medications on File Prior to Visit  Medication Sig Dispense Refill  . aspirin 81 MG chewable tablet Chew 1 tablet (81 mg total) by mouth daily.    Marland Kitchen. atorvastatin (LIPITOR) 80 MG tablet Take 1 tablet (80 mg total) by mouth daily at 6 PM. 90 tablet 1  . lisinopril (PRINIVIL,ZESTRIL) 40 MG tablet Take 1 tablet (40 mg total) by mouth daily. 90 tablet 3  . metoprolol tartrate (LOPRESSOR) 25 MG tablet Take 0.5 tablets (12.5 mg total) by mouth 2 (two) times daily. 60 tablet 1  . nitroGLYCERIN (NITROSTAT) 0.4 MG SL tablet Place 1 tablet (0.4 mg total) under the tongue every 5 (five) minutes as needed. 25 tablet 2  . ticagrelor (BRILINTA) 90 MG TABS tablet Take 1 tablet (90 mg total) by mouth 2 (two) times daily. 180 tablet 3   No current facility-administered medications on file prior to visit.    Family History  Problem Relation Age of Onset  . Cancer Mother 542  . CAD Father 8762  . CAD Brother 5055  . AAA (abdominal aortic aneurysm) Maternal Grandmother 90  . AAA (abdominal aortic aneurysm) Paternal Grandmother [phone]   Social History   Socioeconomic History  . Marital status: Single    Spouse name: Not on file  . Number of children: Not on file  . Years of education: Not on file  .  Highest education level: Not on file  Social Needs  . Financial resource strain: Not on file  . Food insecurity - worry: Not on file  . Food insecurity - inability: Not on file  . Transportation needs - medical: Not on file  . Transportation needs - non-medical: Not on file  Occupational History  . Occupation: Patient assistance    Employer: [employer]  Tobacco Use  . Smoking  status: Never Smoker  . Smokeless tobacco: Never Used  Substance and Sexual Activity  . Alcohol use: No    Frequency: Never  . Drug use: No  . Sexual activity: Not on file  Other Topics Concern  . Not on file  Social History Narrative  . Not on file    Review of Systems: Constitutional: Negative for fever, chills, diaphoresis, activity change, appetite change and fatigue. HENT: Negative for ear pain, nosebleeds, congestion, facial swelling, rhinorrhea, neck pain, neck stiffness and ear discharge.  Eyes: Negative for pain, discharge, redness, itching and visual disturbance. Respiratory: Negative for cough, choking, chest tightness, shortness of breath, wheezing and stridor.  Cardiovascular: Negative for chest pain, palpitations and leg swelling. Gastrointestinal: Negative for abdominal distention. Genitourinary: Negative for dysuria, urgency, frequency, hematuria, flank pain, decreased urine volume, difficulty urinating and dyspareunia.  Musculoskeletal: Negative for back pain, joint swelling, arthralgia and gait problem. Neurological: Negative for dizziness, tremors, seizures, syncope, facial asymmetry, speech difficulty, weakness, light-headedness, numbness and headaches.  Hematological: Negative for adenopathy. Does not bruise/bleed easily. Psychiatric/Behavioral: Negative for hallucinations, behavioral problems, confusion, dysphoric mood, decreased concentration and agitation.    Objective:   Vitals:   12/14/16 1417  BP: 140/88  Pulse: 73  Resp: 16  Temp: 97.8 F (36.6 C)  SpO2: 99%    Physical Exam: Constitutional: Patient appears well-developed and well-nourished. No distress. HENT: Normocephalic, atraumatic, External right and left ear normal. Oropharynx is clear and moist.  Eyes: Conjunctivae and EOM are normal. PERRLA, no scleral icterus. Neck: Normal ROM. Neck supple. No JVD. No tracheal deviation. No thyromegaly. CVS: RRR, S1/S2 +, no murmurs, no gallops, no  carotid bruit.  Pulmonary: Effort and breath sounds normal, no stridor, rhonchi, wheezes, rales.  Abdominal: Soft. BS +, no distension, tenderness, rebound or guarding.  Musculoskeletal: Normal range of motion. No edema and no tenderness.  Lymphadenopathy: No lymphadenopathy noted, cervical, inguinal or axillary Neuro: Alert. Normal reflexes, muscle tone coordination. No cranial nerve deficit. Skin: Skin is warm and dry. No rash noted. Not diaphoretic. No erythema. No pallor. Psychiatric: Normal mood and affect. Behavior, judgment, thought content normal. Vitals:   12/14/16 1417  BP: 140/88  Pulse: 73  Resp: 16  Temp: 97.8 F (36.6 C)  SpO2: 99%      Lab Results  Component Value Date   WBC 6.0 11/27/2016   HGB 14.4 11/27/2016   HCT 44.2 11/27/2016   MCV 85.5 11/27/2016   PLT 125 (L) 11/27/2016   Lab Results  Component Value Date   CREATININE 1.00 11/27/2016   BUN 11 11/27/2016   NA 139 11/27/2016   K 3.9 11/27/2016   CL 105 11/27/2016   CO2 28 11/27/2016    Lab Results  Component Value Date   HGBA1C 5.8 (H) 11/26/2016   Lipid Panel     Component Value Date/Time   CHOL 206 (H) 11/26/2016 0145   TRIG 123 11/26/2016 0145   HDL 52 11/26/2016 0145   CHOLHDL 4.0 11/26/2016 0145   VLDL 25 11/26/2016 0145   LDLCALC  129 (H) 11/26/2016 0145        Assessment and plan:  1. ST elevation myocardial infarction (STEMI) of lateral wall (HCC), stable. No recurrent anginal symptoms. Compliant with current medication regimen. All medication going forward are eligible to be refilled at [facility] pharmacy, as patient doesn't have  Insurance.  Continue ASA, Brilinta, statin, beta blocker and Ace-inhibitor.    2. Essential hypertension, stable, slightly elevated today.  We have discussed target BP range and blood pressure goal. I have advised patient to check BP regularly and to call us back or report to clinic if the numbers are consistently higher than 140/90. We discussed the  importance of compliance with medical therapy and DASH diet recommended, consequences of uncontrolled hypertension discussed.  - continue current BP medications   3. Other hyperlipidemia, not at goal: LDL 129, total cholesterol 206 -Continue to reduce intake of foods that fried or processed. -Continue statin therapy --Encouraged to engage in 150 minutes of weekly physical activity as tolerated.   4. Prediabetes, A1C 5.8.  -Encouraged modified carbohydrate diet.  -Encouraged to engage in 150 minutes of weekly physical activity as tolerated. -Recheck A1C in 3 months.   -Patient provided a [facility].   Return in about 6 weeks (around 01/25/2017), or chronic disease management .  The patient was given clear instructions to go to ER or return to medical center if symptoms don't improve, worsen or new problems develop. The patient verbalized understanding. The patient was told to call to get lab results if they haven't heard anything in the next week.     This note has been created with Education officer, environmental. Any transcriptional errors are unintentional.

## 2016-12-15 LAB — BASIC METABOLIC PANEL
BUN / CREAT RATIO: 13 (ref 10–24)
BUN: 12 mg/dL (ref 8–27)
CO2: 27 mmol/L (ref 20–29)
CREATININE: 0.93 mg/dL (ref 0.76–1.27)
Calcium: 9.5 mg/dL (ref 8.6–10.2)
Chloride: 102 mmol/L (ref 96–106)
GFR calc Af Amer: 102 mL/min/{1.73_m2} (ref >59)
GFR, EST NON AFRICAN AMERICAN: 88 mL/min/{1.73_m2} (ref >59)
Glucose: 96 mg/dL (ref 65–99)
Potassium: 4.1 mmol/L (ref 3.5–5.2)
SODIUM: 143 mmol/L (ref 134–144)

## 2016-12-15 LAB — MICROALBUMIN / CREATININE URINE RATIO
Creatinine, Urine: 260.5 mg/dL
MICROALBUM., U, RANDOM: 125.3 ug/mL
Microalb/Creat Ratio: 48.1 mg/g creat — ABNORMAL HIGH (ref 0.0–30.0)

## 2016-12-15 LAB — THYROID PANEL WITH TSH
Free Thyroxine Index: 1.6 (ref 1.2–4.9)
T3 Uptake Ratio: 23 % — ABNORMAL LOW (ref 24–39)
T4 TOTAL: 7.1 ug/dL (ref 4.5–12.0)
TSH: 1.62 u[IU]/mL (ref 0.450–4.500)

## 2016-12-15 LAB — MAGNESIUM: Magnesium: 2.1 mg/dL (ref 1.6–2.3)

## 2016-12-17 ENCOUNTER — Ambulatory Visit: Payer: Self-pay | Admitting: Family Medicine

## 2016-12-17 LAB — POCT URINALYSIS DIP (DEVICE)
BILIRUBIN URINE: NEGATIVE
GLUCOSE, UA: NEGATIVE mg/dL
Hgb urine dipstick: NEGATIVE
LEUKOCYTES UA: NEGATIVE
NITRITE: NEGATIVE
Protein, ur: 100 mg/dL — AB
Specific Gravity, Urine: >=1.03 (ref 1.005–1.030)
UROBILINOGEN UA: 1 mg/dL (ref 0.0–1.0)
pH: 6 (ref 5.0–8.0)

## 2016-12-24 ENCOUNTER — Ambulatory Visit (INDEPENDENT_AMBULATORY_CARE_PROVIDER_SITE_OTHER): Payer: Self-pay | Admitting: Family Medicine

## 2016-12-24 ENCOUNTER — Telehealth: Payer: Self-pay | Admitting: Family Medicine

## 2016-12-24 ENCOUNTER — Encounter: Payer: Self-pay | Admitting: Family Medicine

## 2016-12-24 VITALS — BP 150/84 | HR 67 | Temp 98.0°F | Resp 16 | Ht 71.0 in | Wt 237.0 lb

## 2016-12-24 DIAGNOSIS — I10 Essential (primary) hypertension: Secondary | ICD-10-CM

## 2016-12-24 DIAGNOSIS — R059 Cough, unspecified: Secondary | ICD-10-CM

## 2016-12-24 DIAGNOSIS — R05 Cough: Secondary | ICD-10-CM

## 2016-12-24 DIAGNOSIS — R61 Generalized hyperhidrosis: Secondary | ICD-10-CM

## 2016-12-24 DIAGNOSIS — R062 Wheezing: Secondary | ICD-10-CM

## 2016-12-24 DIAGNOSIS — R220 Localized swelling, mass and lump, head: Secondary | ICD-10-CM

## 2016-12-24 LAB — POCT URINALYSIS DIP (DEVICE)
BILIRUBIN URINE: NEGATIVE
Glucose, UA: NEGATIVE mg/dL
Hgb urine dipstick: NEGATIVE
KETONES UR: NEGATIVE mg/dL
Leukocytes, UA: NEGATIVE
Nitrite: NEGATIVE
PH: 7 (ref 5.0–8.0)
PROTEIN: NEGATIVE mg/dL
SPECIFIC GRAVITY, URINE: 1.02 (ref 1.005–1.030)
Urobilinogen, UA: 0.2 mg/dL (ref 0.0–1.0)

## 2016-12-24 MED ORDER — METOPROLOL TARTRATE 25 MG PO TABS: 12.5000 mg | ORAL_TABLET | Freq: Two times a day (BID) | ORAL | 1 refills | Status: DC

## 2016-12-24 MED ORDER — TICAGRELOR 90 MG PO TABS: 90.0000 mg | ORAL_TABLET | Freq: Two times a day (BID) | ORAL | 3 refills | Status: DC

## 2016-12-24 MED ORDER — LOSARTAN POTASSIUM 50 MG PO TABS: 50.0000 mg | ORAL_TABLET | Freq: Every day | ORAL | 3 refills | Status: DC

## 2016-12-24 MED FILL — LOSARTAN POTASSIUM 50 MG TA: 50 | 30 days supply | Qty: 30 | Fill #0 | Status: TO

## 2016-12-24 MED FILL — BRILINTA 90 MG TABLET: 90 | 8 days supply | Qty: 16 | Fill #0

## 2016-12-24 NOTE — Progress Notes (Signed)
Patient ID: Vincent [name], male    DOB: 08/07/1955, 61 y.o.   MRN: [id number]  PCP: Bing NeighborsHarris, Garfield Coiner S, FNP  Chief Complaint  Patient presents with  . Wheezing  . Cough    Subjective:  HPI Vincent CaulJohnny [name] is a 61 y.o. male presents for evaluation of wheezing and coughing. Medical problems significant for STEMI (11/2016), hyperlipidemia, and hypertension. [name] is accompanied today by his fiance. He reports three days prior after sexual intercourse, he experienced an episode involving diaphoresis, coughing, wheezing, and the sensation of fluid in his chest. He has no prior history of asthma or COPD. The episode involving all the aforementioned symptoms lasted for a period of little more than one hour. During this episode he did not experienced chest pain, "fiance reports heart remained in rhythm as she listed to him", and BP remained less than 140/90. This is new event and has never happened previously. Of note he has experienced mild swelling on the right side of his lip with taking lisinopril. He and his fiance are concerned that the episode may have been related to reaction from lisinopril. He has not experienced a coughing episode, wheezing, chest pain, shortness of breath, PND, lightheadedness, dizziness, and cough.  Social History   Socioeconomic History  . Marital status: Single    Spouse name: Not on file  . Number of children: Not on file  . Years of education: Not on file  . Highest education level: Not on file  Social Needs  . Financial resource strain: Not on file  . Food insecurity - worry: Not on file  . Food insecurity - inability: Not on file  . Transportation needs - medical: Not on file  . Transportation needs - non-medical: Not on file  Occupational History  . Occupation: Patient assistance    Employer: [employer]  Tobacco Use  . Smoking status: Never Smoker  . Smokeless tobacco: Never Used  Substance and Sexual Activity  . Alcohol  use: No    Frequency: Never  . Drug use: No  . Sexual activity: Not on file  Other Topics Concern  . Not on file  Social History Narrative  . Not on file    Family History  Problem Relation Age of Onset  . Cancer Mother 3942  . CAD Father 6162  . CAD Brother 7055  . AAA (abdominal aortic aneurysm) Maternal Grandmother 90  . AAA (abdominal aortic aneurysm) Paternal Grandmother 90   Review of Systems  Constitutional: Negative.   Respiratory: Negative.   Cardiovascular: Negative for chest pain, palpitations and leg swelling.  Gastrointestinal: Negative.   Genitourinary: Negative.   Psychiatric/Behavioral: Negative.       Patient Active Problem List   Diagnosis Date Noted  . 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 ST elevation myocardial infarction (STEMI) (HCC) 11/25/2016  . Essential hypertension   . Hyperlipidemia   . Hypokalemia     No Known Allergies  Prior to Admission medications   Medication Sig Start Date End Date Taking? Authorizing Provider  aspirin 81 MG chewable tablet Chew 1 tablet (81 mg total) by mouth daily. 12/14/16  Yes Bing NeighborsHarris, Maudene Stotler S, FNP  atorvastatin (LIPITOR) 80 MG tablet Take 1 tablet (80 mg total) by mouth daily at 6 PM. 12/14/16  Yes Bing NeighborsHarris, Valeriano Bain S, FNP  lisinopril (PRINIVIL,ZESTRIL) 40 MG tablet Take 1 tablet (40 mg total) by mouth daily. 12/14/16  Yes Bing NeighborsHarris, Wallis Spizzirri S, FNP  metoprolol tartrate (  LOPRESSOR) 25 MG tablet Take 0.5 tablets (12.5 mg total) by mouth 2 (two) times daily. 12/14/16  Yes Bing NeighborsHarris, Darlyne Schmiesing S, FNP  nitroGLYCERIN (NITROSTAT) 0.4 MG SL tablet Place 1 tablet (0.4 mg total) under the tongue every 5 (five) minutes as needed. 12/14/16  Yes Bing NeighborsHarris, Shaya Altamura S, FNP  ticagrelor (BRILINTA) 90 MG TABS tablet Take 1 tablet (90 mg total) by mouth 2 (two) times daily. 12/14/16  Yes Bing NeighborsHarris, Denym Christenberry S, FNP    Past Medical, Surgical Family and Social History reviewed and updated.    Objective:    Today's Vitals   12/24/16 1355 12/24/16 1401  BP: (!) 170/92 (!) 165/84  Pulse: 67   Resp: 16   Temp: 98 F (36.7 C)   TempSrc: Oral   SpO2: 98%   Weight: 237 lb (107.5 kg)   Height: 5\' 11"  (1.803 m)     Wt Readings from Last 3 Encounters:  12/24/16 237 lb (107.5 kg)  12/14/16 237 lb (107.5 kg)  12/06/16 238 lb 6.4 oz (108.1 kg)    Physical Exam  Constitutional: He is oriented to person, place, and time. He appears well-developed and well-nourished.  HENT:  Head: Normocephalic and atraumatic.  Eyes: Conjunctivae and EOM are normal. Pupils are equal, round, and reactive to light.  Neck: Normal range of motion. Neck supple.  Cardiovascular: Normal rate, regular rhythm, S1 normal, S2 normal and intact distal pulses. Exam reveals distant heart sounds.  No murmur heard. Pulses:      Radial pulses are 2+ on the right side, and 2+ on the left side.  Pulmonary/Chest: Effort normal and breath sounds normal. He has no wheezes.  Abdominal: Soft. Bowel sounds are normal.  Neurological: He is alert and oriented to person, place, and time.  Skin: Skin is warm and dry.  Psychiatric: He has a normal mood and affect. His behavior is normal. Judgment and thought content normal.  Vitals reviewed.  Assessment & Plan:  1. Accelerated hypertension, recheck x 3, stable, elevated. Reports good home readings, attributes high readings to white coat phenomenon. Patient and fiance concerned adverse reactions associated with lisinopril . Will d/c lisinopril, start Losartan 50 mg once daily. Return in 4 weeks for blood pressure follow-up and goal readings at home <140/90. Advised to contact the office   2. Diaphoresis 3. Wheezing 4. Cough Suspect, symptoms are likely secondary to over exertion during sexual intercourse. [name] is less than 6 weeks from suffering from a MI and is likely experiencing deconditioning. EKG completed and compared to most recent 12/06/2016-no changes noted. Obtaining a BMP  and BNP. To rule heart failure symptoms, which is less likely the cause of episode. He has remained symptom free over the course of three days. ?Reaction related to lisinopril.  5. Swelling of upper lip, concern for angioedema related to lisinopril. D/C lisinopril today. Will trial losartan 50 mg once daily.  Advised to discard lisinopril and start Losartan tomorrow.    RTC: 1 month chronic condition follow-up.  -The patient was given clear instructions to go to ER or return to medical center if symptoms do not improve, worsen or new problems develop. The patient verbalized understanding.   Godfrey PickKimberly S. Tiburcio PeaHarris, MSN, FNP-C [facility]  [address] [place], [place] [address] [phone]

## 2016-12-24 NOTE — Telephone Encounter (Signed)
Chart opened in error

## 2016-12-25 ENCOUNTER — Telehealth: Payer: Self-pay | Admitting: Family Medicine

## 2016-12-25 LAB — BASIC METABOLIC PANEL
BUN / CREAT RATIO: 12 (ref 10–24)
BUN: 12 mg/dL (ref 8–27)
CO2: 27 mmol/L (ref 20–29)
CREATININE: 1.04 mg/dL (ref 0.76–1.27)
Calcium: 9.1 mg/dL (ref 8.6–10.2)
Chloride: 101 mmol/L (ref 96–106)
GFR, EST AFRICAN AMERICAN: 89 mL/min/{1.73_m2} (ref >59)
GFR, EST NON AFRICAN AMERICAN: 77 mL/min/{1.73_m2} (ref >59)
Glucose: 87 mg/dL (ref 65–99)
Potassium: 3.7 mmol/L (ref 3.5–5.2)
Sodium: 141 mmol/L (ref 134–144)

## 2016-12-25 LAB — BRAIN NATRIURETIC PEPTIDE: BNP: 100.5 pg/mL — ABNORMAL HIGH (ref 0.0–100.0)

## 2016-12-25 NOTE — Telephone Encounter (Signed)
Please contact patient to advise his lab work from yesterday was within normal range. If he experiences another episode as he did previously, he should go immediately to the emergency department.   Godfrey PickKimberly S. Tiburcio PeaHarris, MSN, FNP-C [facility]  [address] [place], [place] [address] [phone]

## 2016-12-25 NOTE — Telephone Encounter (Signed)
Patient notified and agrees with plan

## 2017-01-02 MED FILL — METOPROLOL TARTRATE 25 MG T: 25 | 30 days supply | Qty: 60 | Fill #0

## 2017-01-02 MED FILL — BRILINTA 90 MG TABLET: 90 | 8 days supply | Qty: 16 | Fill #0

## 2017-01-14 MED FILL — BRILINTA 90 MG TABLET: 90 | 8 days supply | Qty: 16 | Fill #1

## 2017-01-22 MED FILL — LOSARTAN POTASSIUM 50 MG TA: 50 | 30 days supply | Qty: 30 | Fill #0

## 2017-01-22 MED FILL — BRILINTA 90 MG TABLET: 90 | 8 days supply | Qty: 16 | Fill #2 | Status: TO

## 2017-01-25 ENCOUNTER — Ambulatory Visit (INDEPENDENT_AMBULATORY_CARE_PROVIDER_SITE_OTHER): Payer: Self-pay | Admitting: Family Medicine

## 2017-01-25 ENCOUNTER — Encounter: Payer: Self-pay | Admitting: Family Medicine

## 2017-01-25 VITALS — BP 140/78 | HR 78 | Temp 97.8°F | Resp 16 | Ht 71.0 in | Wt 236.0 lb

## 2017-01-25 DIAGNOSIS — I10 Essential (primary) hypertension: Secondary | ICD-10-CM

## 2017-01-25 DIAGNOSIS — R0602 Shortness of breath: Secondary | ICD-10-CM

## 2017-01-25 DIAGNOSIS — I2129 ST elevation (STEMI) myocardial infarction involving other sites: Secondary | ICD-10-CM

## 2017-01-25 DIAGNOSIS — N529 Male erectile dysfunction, unspecified: Secondary | ICD-10-CM

## 2017-01-25 MED ORDER — LOSARTAN POTASSIUM 50 MG PO TABS: 50.0000 mg | ORAL_TABLET | Freq: Every day | ORAL | 3 refills | Status: DC

## 2017-01-25 MED ORDER — METOPROLOL TARTRATE 25 MG PO TABS: 12.5000 mg | ORAL_TABLET | Freq: Two times a day (BID) | ORAL | 1 refills | Status: DC

## 2017-01-25 MED ORDER — ASPIRIN 81 MG PO CHEW: 81.0000 mg | CHEWABLE_TABLET | Freq: Every day | ORAL | 1 refills | Status: DC

## 2017-01-25 MED ORDER — ATORVASTATIN CALCIUM 80 MG PO TABS: 80.0000 mg | ORAL_TABLET | Freq: Every day | ORAL | 1 refills | Status: DC

## 2017-01-25 MED ORDER — TICAGRELOR 90 MG PO TABS: 90.0000 mg | ORAL_TABLET | Freq: Two times a day (BID) | ORAL | 3 refills | Status: DC

## 2017-01-25 MED FILL — BRILINTA 90 MG TABLET: 90 | 8 days supply | Qty: 16 | Fill #0 | Status: TO

## 2017-01-25 NOTE — Patient Instructions (Signed)
DASH Eating Plan DASH stands for "Dietary Approaches to Stop Hypertension." The DASH eating plan is a healthy eating plan that has been shown to reduce high blood pressure (hypertension). It may also reduce your risk for type 2 diabetes, heart disease, and stroke. The DASH eating plan may also help with weight loss. What are tips for following this plan? General guidelines  Avoid eating more than 2,300 mg (milligrams) of salt (sodium) a day. If you have hypertension, you may need to reduce your sodium intake to 1,500 mg a day.  Limit alcohol intake to no more than 1 drink a day for nonpregnant women and 2 drinks a day for men. One drink equals 12 oz of beer, 5 oz of wine, or 1 oz of hard liquor.  Work with your health care provider to maintain a healthy body weight or to lose weight. Ask what an ideal weight is for you.  Get at least 30 minutes of exercise that causes your heart to beat faster (aerobic exercise) most days of the week. Activities may include walking, swimming, or biking.  Work with your health care provider or diet and nutrition specialist (dietitian) to adjust your eating plan to your individual calorie needs. Reading food labels  Check food labels for the amount of sodium per serving. Choose foods with less than 5 percent of the Daily Value of sodium. Generally, foods with less than 300 mg of sodium per serving fit into this eating plan.  To find whole grains, look for the word "whole" as the first word in the ingredient list. Shopping  Buy products labeled as "low-sodium" or "no salt added."  Buy fresh foods. Avoid canned foods and premade or frozen meals. Cooking  Avoid adding salt when cooking. Use salt-free seasonings or herbs instead of table salt or sea salt. Check with your health care provider or pharmacist before using salt substitutes.  Do not fry foods. Cook foods using healthy methods such as baking, boiling, grilling, and broiling instead.  Cook with  heart-healthy oils, such as olive, canola, soybean, or sunflower oil. Meal planning   Eat a balanced diet that includes: ? 5 or more servings of fruits and vegetables each day. At each meal, try to fill half of your plate with fruits and vegetables. ? Up to 6-8 servings of whole grains each day. ? Less than 6 oz of lean meat, poultry, or fish each day. A 3-oz serving of meat is about the same size as a deck of cards. One egg equals 1 oz. ? 2 servings of low-fat dairy each day. ? A serving of nuts, seeds, or beans 5 times each week. ? Heart-healthy fats. Healthy fats called Omega-3 fatty acids are found in foods such as flaxseeds and coldwater fish, like sardines, salmon, and mackerel.  Limit how much you eat of the following: ? Canned or prepackaged foods. ? Food that is high in trans fat, such as fried foods. ? Food that is high in saturated fat, such as fatty meat. ? Sweets, desserts, sugary drinks, and other foods with added sugar. ? Full-fat dairy products.  Do not salt foods before eating.  Try to eat at least 2 vegetarian meals each week.  Eat more home-cooked food and less restaurant, buffet, and fast food.  When eating at a restaurant, ask that your food be prepared with less salt or no salt, if possible. What foods are recommended? The items listed may not be a complete list. Talk with your dietitian about what   dietary choices are best for you. Grains Whole-grain or whole-wheat bread. Whole-grain or whole-wheat pasta. Brown rice. Oatmeal. Quinoa. Bulgur. Whole-grain and low-sodium cereals. Pita bread. Low-fat, low-sodium crackers. Whole-wheat flour tortillas. Vegetables Fresh or frozen vegetables (raw, steamed, roasted, or grilled). Low-sodium or reduced-sodium tomato and vegetable juice. Low-sodium or reduced-sodium tomato sauce and tomato paste. Low-sodium or reduced-sodium canned vegetables. Fruits All fresh, dried, or frozen fruit. Canned fruit in natural juice (without  added sugar). Meat and other protein foods Skinless chicken or turkey. Ground chicken or turkey. Pork with fat trimmed off. Fish and seafood. Egg whites. Dried beans, peas, or lentils. Unsalted nuts, nut butters, and seeds. Unsalted canned beans. Lean cuts of beef with fat trimmed off. Low-sodium, lean deli meat. Dairy Low-fat (1%) or fat-free (skim) milk. Fat-free, low-fat, or reduced-fat cheeses. Nonfat, low-sodium ricotta or cottage cheese. Low-fat or nonfat yogurt. Low-fat, low-sodium cheese. Fats and oils Soft margarine without trans fats. Vegetable oil. Low-fat, reduced-fat, or light mayonnaise and salad dressings (reduced-sodium). Canola, safflower, olive, soybean, and sunflower oils. Avocado. Seasoning and other foods Herbs. Spices. Seasoning mixes without salt. Unsalted popcorn and pretzels. Fat-free sweets. What foods are not recommended? The items listed may not be a complete list. Talk with your dietitian about what dietary choices are best for you. Grains Baked goods made with fat, such as croissants, muffins, or some breads. Dry pasta or rice meal packs. Vegetables Creamed or fried vegetables. Vegetables in a cheese sauce. Regular canned vegetables (not low-sodium or reduced-sodium). Regular canned tomato sauce and paste (not low-sodium or reduced-sodium). Regular tomato and vegetable juice (not low-sodium or reduced-sodium). Pickles. Olives. Fruits Canned fruit in a light or heavy syrup. Fried fruit. Fruit in cream or butter sauce. Meat and other protein foods Fatty cuts of meat. Ribs. Fried meat. Bacon. Sausage. Bologna and other processed lunch meats. Salami. Fatback. Hotdogs. Bratwurst. Salted nuts and seeds. Canned beans with added salt. Canned or smoked fish. Whole eggs or egg yolks. Chicken or turkey with skin. Dairy Whole or 2% milk, cream, and half-and-half. Whole or full-fat cream cheese. Whole-fat or sweetened yogurt. Full-fat cheese. Nondairy creamers. Whipped toppings.  Processed cheese and cheese spreads. Fats and oils Butter. Stick margarine. Lard. Shortening. Ghee. Bacon fat. Tropical oils, such as coconut, palm kernel, or palm oil. Seasoning and other foods Salted popcorn and pretzels. Onion salt, garlic salt, seasoned salt, table salt, and sea salt. Worcestershire sauce. Tartar sauce. Barbecue sauce. Teriyaki sauce. Soy sauce, including reduced-sodium. Steak sauce. Canned and packaged gravies. Fish sauce. Oyster sauce. Cocktail sauce. Horseradish that you find on the shelf. Ketchup. Mustard. Meat flavorings and tenderizers. Bouillon cubes. Hot sauce and Tabasco sauce. Premade or packaged marinades. Premade or packaged taco seasonings. Relishes. Regular salad dressings. Where to find more information:  National Heart, Lung, and Blood Institute: www.nhlbi.nih.gov  American Heart Association: www.heart.org Summary  The DASH eating plan is a healthy eating plan that has been shown to reduce high blood pressure (hypertension). It may also reduce your risk for type 2 diabetes, heart disease, and stroke.  With the DASH eating plan, you should limit salt (sodium) intake to 2,300 mg a day. If you have hypertension, you may need to reduce your sodium intake to 1,500 mg a day.  When on the DASH eating plan, aim to eat more fresh fruits and vegetables, whole grains, lean proteins, low-fat dairy, and heart-healthy fats.  Work with your health care provider or diet and nutrition specialist (dietitian) to adjust your eating plan to your individual   calorie needs. This information is not intended to replace advice given to you by your health care provider. Make sure you discuss any questions you have with your health care provider. Document Released: 12/14/2010 Document Revised: 12/19/2015 Document Reviewed: 12/19/2015 Elsevier Interactive Patient Education  2018 Elsevier Inc.  

## 2017-01-25 NOTE — Progress Notes (Signed)
Patient ID: Vincent [name], male    DOB: 1955/01/14, 62 y.o.   MRN: [id number]  PCP: Bing Neighbors, FNP  Chief Complaint  Patient presents with  . Follow-up    1 month on chronis condition    Subjective:  HPI Vincent [name] is a 62 y.o. male with CAD w/STEMI, hypertension, hyperlipidemia,  presents for evaluation 1 month follow up of chronic conditions. [name] reports feeling well overall. He has resumed all of his normal daily activities of living. He is working out performing full body and cardio-physical activity without experiencing chest pain or shortness of breath.  Only working out at least 3 times per week.  He complains of some fatigue with routine activity is different pre-MI.  He is complaining of some shortness of breath with bending down and getting out of a hot shower.  If he can other reports that at rest he does experience some wheezing.  He has no prior medical history of asthma or COPD.  He remains compliant with all medication and is currently anticoagulated with Brilinta.  Reports one episodic nosebleed yesterday which is something he has never experienced previously.  He was able to stop the nosebleed within 10-20 minutes of its onset.  He complains today of ED symptoms.  Difficulty obtaining an erection and maintaining an erection.  This is a new problem since starting new medication regimen.  He reports no history of low testosterone. Social History   Socioeconomic History  . Marital status: Single    Spouse name: Not on file  . Number of children: Not on file  . Years of education: Not on file  . Highest education level: Not on file  Social Needs  . Financial resource strain: Not on file  . Food insecurity - worry: Not on file  . Food insecurity - inability: Not on file  . Transportation needs - medical: Not on file  . Transportation needs - non-medical: Not on file  Occupational History  . Occupation: Patient assistance    Employer: [employer]  Tobacco Use  . Smoking status: Never Smoker  . Smokeless tobacco: Never Used  Substance and Sexual Activity  . Alcohol use: No    Frequency: Never  . Drug use: No  . Sexual activity: Not on file  Other Topics Concern  . Not on file  Social History Narrative  . Not on file    Family History  Problem Relation Age of Onset  . Cancer Mother 83  . CAD Father 74  . CAD Brother 94  . AAA (abdominal aortic aneurysm) Maternal Grandmother 90  . AAA (abdominal aortic aneurysm) Paternal Grandmother 90   Review of Systems  Constitutional: Positive for fatigue.  HENT: Negative.   Respiratory: Positive for shortness of breath and wheezing.   Cardiovascular: Negative.   Endocrine: Negative.   Genitourinary: Negative for difficulty urinating, dysuria, scrotal swelling, testicular pain and urgency.       Erectile dysfunction   Musculoskeletal: Negative.   Neurological: Negative.   Psychiatric/Behavioral: Negative.    Patient Active Problem List   Diagnosis Date Noted  . 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 ST elevation myocardial infarction (STEMI) (HCC) 11/25/2016  . Essential hypertension   . Hyperlipidemia   . Hypokalemia     No Known Allergies  Prior to Admission medications   Medication Sig Start Date End Date Taking? Authorizing Provider  aspirin 81 MG chewable tablet Chew 1  tablet (81 mg total) by mouth daily. 12/14/16  Yes Bing NeighborsHarris, Florie Carico S, FNP  atorvastatin (LIPITOR) 80 MG tablet Take 1 tablet (80 mg total) by mouth daily at 6 PM. 12/14/16  Yes Bing NeighborsHarris, Zorina Mallin S, FNP  losartan (COZAAR) 50 MG tablet Take 1 tablet (50 mg total) by mouth daily. 12/24/16  Yes Bing NeighborsHarris, Avina Eberle S, FNP  metoprolol tartrate (LOPRESSOR) 25 MG tablet Take 0.5 tablets (12.5 mg total) by mouth 2 (two) times daily. 12/24/16  Yes Bing NeighborsHarris, Jhordyn Hoopingarner S, FNP  nitroGLYCERIN (NITROSTAT) 0.4 MG SL tablet Place 1 tablet (0.4 mg total) under the tongue  every 5 (five) minutes as needed. 12/14/16  Yes Bing NeighborsHarris, Kalief Kattner S, FNP  ticagrelor (BRILINTA) 90 MG TABS tablet Take 1 tablet (90 mg total) by mouth 2 (two) times daily. 12/24/16  Yes Bing NeighborsHarris, Wasim Hurlbut S, FNP    Past Medical, Surgical Family and Social History reviewed and updated.    Objective:   Today's Vitals   01/25/17 1523 01/25/17 1545  BP: (!) 142/88 140/78  Pulse: 78   Resp: 16   Temp: 97.8 F (36.6 C)   TempSrc: Oral   SpO2: 99%   Weight: 236 lb (107 kg)   Height: 5\' 11"  (1.803 m)     Wt Readings from Last 3 Encounters:  01/25/17 236 lb (107 kg)  12/24/16 237 lb (107.5 kg)  12/14/16 237 lb (107.5 kg)    Physical Exam  Constitutional: He is oriented to person, place, and time. He appears well-developed and well-nourished.  HENT:  Head: Normocephalic and atraumatic.  Eyes: Conjunctivae and EOM are normal. Pupils are equal, round, and reactive to light.  Neck: Normal range of motion. Neck supple.  Cardiovascular: Normal rate, regular rhythm, normal heart sounds and intact distal pulses.  Pulmonary/Chest: Effort normal and breath sounds normal. No respiratory distress. He has no wheezes. He has no rales. He exhibits no tenderness.  Abdominal: Soft. Bowel sounds are normal.  Musculoskeletal: Normal range of motion.  Neurological: He is alert and oriented to person, place, and time.  Skin: Skin is warm and dry.  Psychiatric: He has a normal mood and affect. Judgment and thought content normal.  Vitals reviewed.  Assessment & Plan:  1. ST elevation myocardial infarction (STEMI) of lateral wall (HCC), negative of chest pain, no use of nitroglycerin, tolerating physical activity without CP. Continue DASH diet and close follow-up and management with cardiology.    2. Essential hypertension, initially elevated, repeat improved. Goal readings less than 140/90.  We have discussed target BP range and blood pressure goal. I have advised patient to check BP regularly and to  call us back or report to clinic if the numbers are consistently higher than 140/90. We discussed the importance of compliance with medical therapy and DASH diet recommended, consequences of uncontrolled hypertension discussed. Continue current BP medications  3. SOB (shortness of breath), occurring with bending and exposure to heat and humid environments. Not occurring with aggressive physical activity. If symptoms persistent or begin to occur routinely, follow-up with me here in office and I can prescribed albuterol inhaler.    4. Erectile dysfunction, unspecified erectile dysfunction type, patient recently experienced MI and is requesting medication for ED symptoms. Will defer to cardiology for evaluate and recommendation as to the safety of  patient to use  phosphodiesterase (PDE) inhibitors.   Contacted [facility] regarding medication assistance-patient will be able to get his Brilinta through medication assistance program for either $5-$10 or no cost at all.  RTC: 6 months for chronic condition management   Godfrey Pick. Tiburcio Pea, MSN, FNP-C [facility]  [address] [place], [place] [address] [phone]

## 2017-01-28 ENCOUNTER — Encounter (HOSPITAL_BASED_OUTPATIENT_CLINIC_OR_DEPARTMENT_OTHER): Payer: Self-pay

## 2017-02-01 MED FILL — BRILINTA 90 MG TABLET: 90 | 8 days supply | Qty: 16 | Fill #0

## 2017-02-12 MED FILL — BRILINTA 90 MG TABLET: 90 | 8 days supply | Qty: 16 | Fill #1

## 2017-02-18 MED FILL — BRILINTA 90 MG TABLET: 90 | 8 days supply | Qty: 16 | Fill #2

## 2017-02-22 ENCOUNTER — Other Ambulatory Visit: Payer: Self-pay | Admitting: Cardiology

## 2017-02-22 ENCOUNTER — Other Ambulatory Visit: Payer: Self-pay

## 2017-02-22 MED FILL — ATORVASTATIN 80 MG TABLET: 80 | 30 days supply | Qty: 30 | Fill #0

## 2017-02-22 MED FILL — METOPROLOL TARTRATE 25 MG T: 25 | 30 days supply | Qty: 60 | Fill #0

## 2017-02-22 MED FILL — LOSARTAN POTASSIUM 50 MG TA: 50 | 30 days supply | Qty: 30 | Fill #1

## 2017-02-22 MED FILL — BRILINTA 90 MG TABLET: 90 | 8 days supply | Qty: 16 | Fill #3

## 2017-02-22 NOTE — Telephone Encounter (Signed)
This is Dr. Berry's pt 

## 2017-02-22 NOTE — Telephone Encounter (Signed)
REFILL 

## 2017-03-06 ENCOUNTER — Encounter: Payer: Self-pay | Admitting: Cardiovascular Disease

## 2017-03-06 ENCOUNTER — Ambulatory Visit (INDEPENDENT_AMBULATORY_CARE_PROVIDER_SITE_OTHER): Payer: Self-pay | Admitting: Cardiovascular Disease

## 2017-03-06 VITALS — BP 145/82 | HR 79 | Ht 71.5 in | Wt 233.0 lb

## 2017-03-06 DIAGNOSIS — E7849 Other hyperlipidemia: Secondary | ICD-10-CM

## 2017-03-06 DIAGNOSIS — I10 Essential (primary) hypertension: Secondary | ICD-10-CM

## 2017-03-06 MED ORDER — CLOPIDOGREL BISULFATE 75 MG PO TABS: 75.0000 mg | ORAL_TABLET | Freq: Every day | ORAL | 1 refills | Status: DC

## 2017-03-06 MED ORDER — AMLODIPINE BESYLATE 5 MG PO TABS: 5.0000 mg | ORAL_TABLET | Freq: Every day | ORAL | 3 refills | Status: DC

## 2017-03-06 NOTE — Assessment & Plan Note (Signed)
[name] presented with lateral ST segment elevation 11/25/16 as a transfer from [facility]. I catheterized him via the right radial approach revealing noncritical CAD except for high grade small to medium-sized diagonal branch stenosis which I elected to treat medically. EF was normal. Troponin peaked at 5. He's had no recurrent chest pain although he does complain of some shortness of breath which is a new finding. He is on Brilenta which I am going to change to Plavix.

## 2017-03-06 NOTE — Assessment & Plan Note (Signed)
History of essential hypertension with blood pressure 145/82. He is on losartan and metoprolol. Continue current meds are current dosing.

## 2017-03-06 NOTE — Addendum Note (Signed)
Addended by: Evans LanceSTOVER, Jazmene Racz W on: 03/06/2017 09:46 AM   Modules accepted: Orders

## 2017-03-06 NOTE — Patient Instructions (Addendum)
Medication Instructions: Your physician recommends that you continue on your current medications as directed. Please refer to the Current Medication list given to you today.  STOP Brilinta  START Plavix 75 mg daily.  START Amlodipine 5 mg daily.  Labwork: Your physician recommends that you return for a FASTING lipid profile and hepatic function panel at your earliest convenience.   Follow-Up: Your physician recommends that you schedule a follow-up appointment in: 1 month with PharmD in [facility].  We request that you follow-up in: 6 months with Theodore Demark, PA and in 12 months with Dr San Morelle will receive a reminder letter in the mail two months in advance. If you don't receive a letter, please call our office to schedule the follow-up appointment.  If you need a refill on your cardiac medications before your next appointment, please call your pharmacy.   Heart-Healthy Eating Plan Heart-healthy meal planning includes:  Limiting unhealthy fats.  Increasing healthy fats.  Making other small dietary changes.  You may need to talk with your doctor or a diet specialist (dietitian) to create an eating plan that is right for you. What types of fat should I choose?  Choose healthy fats. These include olive oil and canola oil, flaxseeds, walnuts, almonds, and seeds.  Eat more omega-3 fats. These include salmon, mackerel, sardines, tuna, flaxseed oil, and ground flaxseeds. Try to eat fish at least twice each week.  Limit saturated fats. ? Saturated fats are often found in animal products, such as meats, butter, and cream. ? Plant sources of saturated fats include palm oil, palm kernel oil, and coconut oil.  Avoid foods with partially hydrogenated oils in them. These include stick margarine, some tub margarines, cookies, crackers, and other baked goods. These contain trans fats. What general guidelines do I need to follow?  Check food labels carefully. Identify foods with  trans fats or high amounts of saturated fat.  Fill one half of your plate with vegetables and green salads. Eat 4-5 servings of vegetables per day. A serving of vegetables is: ? 1 cup of raw leafy vegetables. ?  cup of raw or cooked cut-up vegetables. ?  cup of vegetable juice.  Fill one fourth of your plate with whole grains. Look for the word "whole" as the first word in the ingredient list.  Fill one fourth of your plate with lean protein foods.  Eat 4-5 servings of fruit per day. A serving of fruit is: ? One medium whole fruit. ?  cup of dried fruit. ?  cup of fresh, frozen, or canned fruit. ?  cup of 100% fruit juice.  Eat more foods that contain soluble fiber. These include apples, broccoli, carrots, beans, peas, and barley. Try to get 20-30 g of fiber per day.  Eat more home-cooked food. Eat less restaurant, buffet, and fast food.  Limit or avoid alcohol.  Limit foods high in starch and sugar.  Avoid fried foods.  Avoid frying your food. Try baking, boiling, grilling, or broiling it instead. You can also reduce fat by: ? Removing the skin from poultry. ? Removing all visible fats from meats. ? Skimming the fat off of stews, soups, and gravies before serving them. ? Steaming vegetables in water or broth.  Lose weight if you are overweight.  Eat 4-5 servings of nuts, legumes, and seeds per week: ? One serving of dried beans or legumes equals  cup after being cooked. ? One serving of nuts equals 1 ounces. ? One serving of seeds  equals  ounce or one tablespoon.  You may need to keep track of how much salt or sodium you eat. This is especially true if you have high blood pressure. Talk with your doctor or dietitian to get more information. What foods can I eat? Grains Breads, including [place], white, pita, wheat, raisin, rye, oatmeal, and [place]. Tortillas that are neither fried nor made with lard or trans fat. Low-fat rolls, including hotdog and hamburger buns  and English muffins. Biscuits. Muffins. Waffles. Pancakes. Light popcorn. Whole-grain cereals. Flatbread. Melba toast. Pretzels. Breadsticks. Rusks. Low-fat snacks. Low-fat crackers, including oyster, saltine, matzo, graham, animal, and rye. Rice and pasta, including brown rice and pastas that are made with whole wheat. Vegetables All vegetables. Fruits All fruits, but limit coconut. Meats and Other Protein Sources Lean, well-trimmed beef, veal, pork, and lamb. Chicken and Malaw[place]urkey without skin. All fish and shellfish. Wild duck, rabbit, pheasant, and venison. Egg whites or low-cholesterol egg substitutes. Dried beans, peas, lentils, and tofu. Seeds and most nuts. Dairy Low-fat or nonfat cheeses, including ricotta, string, and mozzarella. Skim or 1% milk that is liquid, powdered, or evaporated. Buttermilk that is made with low-fat milk. Nonfat or low-fat yogurt. Beverages Mineral water. Diet carbonated beverages. Sweets and Desserts Sherbets and fruit ices. Honey, jam, marmalade, jelly, and syrups. Meringues and gelatins. Pure sugar candy, such as hard candy, jelly beans, gumdrops, mints, marshmallows, and small amounts of dark chocolate. MGM MIRAGEngel food cake. Eat all sweets and desserts in moderation. Fats and Oils Nonhydrogenated (trans-free) margarines. Vegetable oils, including soybean, sesame, sunflower, olive, peanut, safflower, corn, canola, and cottonseed. Salad dressings or mayonnaise made with a vegetable oil. Limit added fats and oils that you use for cooking, baking, salads, and as spreads. Other Cocoa powder. Coffee and tea. All seasonings and condiments. The items listed above may not be a complete list of recommended foods or beverages. Contact your dietitian for more options. What foods are not recommended? Grains Breads that are made with saturated or trans fats, oils, or whole milk. Croissants. Butter rolls. Cheese breads. Sweet rolls. Donuts. Buttered popcorn. Chow mein noodles.  High-fat crackers, such as cheese or butter crackers. Meats and Other Protein Sources Fatty meats, such as hotdogs, short ribs, sausage, spareribs, bacon, rib eye roast or steak, and mutton. High-fat deli meats, such as salami and bologna. Caviar. Domestic duck and goose. Organ meats, such as kidney, liver, sweetbreads, and heart. Dairy Cream, sour cream, cream cheese, and creamed cottage cheese. Whole-milk cheeses, including blue (bleu), [address] Jack, HoffmanBrie, [place], [address], [place]avarti, [address], cheddar, Bulls [place]embert, and [place]. Whole or 2% milk that is liquid, evaporated, or condensed. Whole buttermilk. Cream sauce or high-fat cheese sauce. Yogurt that is made from whole milk. Beverages Regular sodas and juice drinks with added sugar. Sweets and Desserts Frosting. Pudding. Cookies. Cakes other than angel food cake. Candy that has milk chocolate or white chocolate, hydrogenated fat, butter, coconut, or unknown ingredients. Buttered syrups. Full-fat ice cream or ice cream drinks. Fats and Oils Gravy that has suet, meat fat, or shortening. Cocoa butter, hydrogenated oils, palm oil, coconut oil, palm kernel oil. These can often be found in baked products, candy, fried foods, nondairy creamers, and whipped toppings. Solid fats and shortenings, including bacon fat, salt pork, lard, and butter. Nondairy cream substitutes, such as coffee creamers and sour cream substitutes. Salad dressings that are made of unknown oils, cheese, or sour cream. The items listed above may not be a complete list of foods and beverages to avoid. Contact your  dietitian for more information. This information is not intended to replace advice given to you by your health care provider. Make sure you discuss any questions you have with your health care provider. Document Released: 06/26/2011 Document Revised: 06/02/2015 Document Reviewed: 06/18/2013 Elsevier Interactive Patient Education  [employer].

## 2017-03-06 NOTE — Progress Notes (Signed)
03/06/2017 Vincent [name]   11/17/1955  [id number]  Primary Physician Bing NeighborsHarris, Kimberly S, FNP Primary Cardiologist: Runell GessJonathan J Shynice Sigel MD Nicholes CalamityFACP, FACC, FAHA, [place]  HPI:  Vincent [name] is a 62 y.o. mildly overweight although fit appearing engaged African-American male father of 4 children who works with his Librarian, [employer]fiancs [employer]. He was seen back in November by Vincent Demarkhonda Barrett PA-C for his first post hospital follow-up. He presented on 11/25/16 with chest pain and lateral ST segment elevation. I catheterized him radially revealing high-grade diagonal branch stenosis and a small to medium-sized diagonal branch. He also had some scattered noncritical disease otherwise with a normal EF. His troponin peaked at 5 and he was discharged home on aspirin and Brilenta. He's had no recurrent chest pain although he does complain of some shortness of breath which may be drug related. His other problems include treated hypertension and hyperlipidemia. He does not smoke.    Current Meds  Medication Sig  . aspirin 81 MG chewable tablet Chew 1 tablet (81 mg total) by mouth daily.  Marland Kitchen. atorvastatin (LIPITOR) 80 MG tablet Take 1 tablet (80 mg total) by mouth daily at 6 PM.  . losartan (COZAAR) 50 MG tablet Take 1 tablet (50 mg total) by mouth daily.  . metoprolol tartrate (LOPRESSOR) 25 MG tablet Take 0.5 tablets (12.5 mg total) by mouth 2 (two) times daily.  . nitroGLYCERIN (NITROSTAT) 0.4 MG SL tablet Place 1 tablet (0.4 mg total) under the tongue every 5 (five) minutes as needed.  . [DISCONTINUED] ticagrelor (BRILINTA) 90 MG TABS tablet Take 1 tablet (90 mg total) by mouth 2 (two) times daily.     No Known Allergies  Social History   Socioeconomic History  . Marital status: Single    Spouse name: Not on file  . Number of children: Not on file  . Years of education: Not on file  . Highest education level: Not on file  Social Needs  . Financial resource strain: Not on file  . Food  insecurity - worry: Not on file  . Food insecurity - inability: Not on file  . Transportation needs - medical: Not on file  . Transportation needs - non-medical: Not on file  Occupational History  . Occupation: Patient assistance    Employer: [employer]  Tobacco Use  . Smoking status: Never Smoker  . Smokeless tobacco: Never Used  Substance and Sexual Activity  . Alcohol use: No    Frequency: Never  . Drug use: No  . Sexual activity: Not on file  Other Topics Concern  . Not on file  Social History Narrative  . Not on file     Review of Systems: General: negative for chills, fever, night sweats or weight changes.  Cardiovascular: negative for chest pain, dyspnea on exertion, edema, orthopnea, palpitations, paroxysmal nocturnal dyspnea or shortness of breath Dermatological: negative for rash Respiratory: negative for cough or wheezing Urologic: negative for hematuria Abdominal: negative for nausea, vomiting, diarrhea, bright red blood per rectum, melena, or hematemesis Neurologic: negative for visual changes, syncope, or dizziness All other systems reviewed and are otherwise negative except as noted above.    Blood pressure (!) 145/82, pulse 79, height 5' 11.5" (1.816 m), weight 233 lb (105.7 kg), SpO2 98 %.  General appearance: alert and no distress Neck: no adenopathy, no carotid bruit, no JVD, supple, symmetrical, trachea midline and thyroid not enlarged, symmetric, no tenderness/mass/nodules Lungs: clear to auscultation bilaterally Heart:  regular rate and rhythm, S1, S2 normal, no murmur, click, rub or gallop Extremities: extremities normal, atraumatic, no cyanosis or edema Pulses: 2+ and symmetric Skin: Skin color, texture, turgor normal. No rashes or lesions Neurologic: Alert and oriented X 3, normal strength and tone. Normal symmetric reflexes. Normal coordination and gait  EKG not performed today  ASSESSMENT AND PLAN:   ST elevation  myocardial infarction (STEMI) of lateral wall [facility]) Vincent [name] presented with lateral ST segment elevation 11/25/16 as a transfer from [facility] [place]. I catheterized him via the right radial approach revealing noncritical CAD except for high grade small to medium-sized diagonal branch stenosis which I elected to treat medically. EF was normal. Troponin peaked at 5. He's had no recurrent chest pain although he does complain of some shortness of breath which is a new finding. He is on Brilenta which I am going to change to Plavix.  Essential hypertension History of essential hypertension with blood pressure 145/82. He is on losartan and metoprolol. Continue current meds are current dosing.  Hyperlipidemia Hyperlipidemia on high-dose statin therapy. His initial lipid profile on the day of presentation revealed an LDL of 129. He does admit to not eating a "heart healthy diet. I'm going to check a lipid liver profile and provide him with literature on heart healthy diet.      Runell Gess MD FACP,FACC,FAHA, [facility] 03/06/2017 9:33 AM

## 2017-03-06 NOTE — Assessment & Plan Note (Signed)
Hyperlipidemia on high-dose statin therapy. His initial lipid profile on the day of presentation revealed an LDL of 129. He does admit to not eating a "heart healthy diet. I'm going to check a lipid liver profile and provide him with literature on heart healthy diet.

## 2017-03-07 MED FILL — AMLODIPINE BESYLATE 5 MG TA: 5 | 30 days supply | Qty: 30 | Fill #0

## 2017-03-07 MED FILL — CLOPIDOGREL 75 MG TABLET: 75 | 90 days supply | Qty: 90 | Fill #0

## 2017-03-19 MED FILL — LOSARTAN POTASSIUM 50 MG TA: 50 | 30 days supply | Qty: 30 | Fill #2

## 2017-03-27 ENCOUNTER — Ambulatory Visit (INDEPENDENT_AMBULATORY_CARE_PROVIDER_SITE_OTHER): Payer: Self-pay | Admitting: Family Medicine

## 2017-03-27 ENCOUNTER — Encounter: Payer: Self-pay | Admitting: Family Medicine

## 2017-03-27 VITALS — BP 142/80 | HR 78 | Temp 98.0°F | Ht 71.5 in | Wt 238.0 lb

## 2017-03-27 DIAGNOSIS — J3489 Other specified disorders of nose and nasal sinuses: Secondary | ICD-10-CM

## 2017-03-27 DIAGNOSIS — J029 Acute pharyngitis, unspecified: Secondary | ICD-10-CM

## 2017-03-27 LAB — POCT RAPID STREP A (OFFICE): Rapid Strep A Screen: NEGATIVE

## 2017-03-27 MED ORDER — AMOXICILLIN 875 MG PO TABS: 875.0000 mg | ORAL_TABLET | Freq: Two times a day (BID) | ORAL | 0 refills | Status: AC

## 2017-03-27 MED ORDER — SALINE SPRAY 0.65 % NA SOLN: 1.0000 | NASAL | 0 refills | Status: DC | PRN

## 2017-03-27 MED FILL — DEEP SEA 0.65% NOSE SPRAY: 0.65 | 30 days supply | Qty: 44 | Fill #0

## 2017-03-27 MED FILL — AMOXICILLIN 875 MG TABLET: 875 | 7 days supply | Qty: 14 | Fill #0

## 2017-03-27 NOTE — Progress Notes (Signed)
Patient ID: Vincent [name], male    DOB: 11/06/1955, [age] y.o.   MRN: [id number]  PCP: Bing NeighborsHarris, Chou Busler S, FNP  Chief Complaint  Patient presents with  . Sore Throat    Subjective:  HPI Vincent CaulJohnny Cagley is a 62 y.o. male presents for evaluation of a sore throat. He reports recent exposure to strep as his significant other was diagnosed last weekend. He developed symptoms of sore throat and pain with swallowing x 2 days ago which have gradually worsened. He denies fever,shortness of breath, dizziness, chills, or abdominal pain. Social History   Socioeconomic History  . Marital status: Single    Spouse name: Not on file  . Number of children: Not on file  . Years of education: Not on file  . Highest education level: Not on file  Social Needs  . Financial resource strain: Not on file  . Food insecurity - worry: Not on file  . Food insecurity - inability: Not on file  . Transportation needs - medical: Not on file  . Transportation needs - non-medical: Not on file  Occupational History  . Occupation: Patient assistance    Employer: [employer]  Tobacco Use  . Smoking status: Never Smoker  . Smokeless tobacco: Never Used  Substance and Sexual Activity  . Alcohol use: No    Frequency: Never  . Drug use: No  . Sexual activity: Not on file  Other Topics Concern  . Not on file  Social History Narrative  . Not on file    Family History  Problem Relation Age of Onset  . Cancer Mother 1542  . CAD Father 2262  . CAD Brother 1355  . AAA (abdominal aortic aneurysm) Maternal Grandmother 90  . AAA (abdominal aortic aneurysm) Paternal Grandmother 90   Review of Systems Pertinent negatives listed in HPI   Patient Active Problem List   Diagnosis Date Noted  . 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 ST elevation myocardial infarction (STEMI) (HCC) 11/25/2016  . Essential hypertension   . Hyperlipidemia   . Hypokalemia      No Known Allergies  Prior to Admission medications   Medication Sig Start Date End Date Taking? Authorizing Provider  amLODipine (NORVASC) 5 MG tablet Take 1 tablet (5 mg total) by mouth daily. 03/06/17 07/04/17 Yes Runell GessBerry, Jonathan J, MD  aspirin 81 MG chewable tablet Chew 1 tablet (81 mg total) by mouth daily. 01/25/17  Yes Bing NeighborsHarris, Dontarious Schaum S, FNP  atorvastatin (LIPITOR) 80 MG tablet Take 1 tablet (80 mg total) by mouth daily at 6 PM. 01/25/17  Yes Bing NeighborsHarris, Tayla Panozzo S, FNP  clopidogrel (PLAVIX) 75 MG tablet Take 1 tablet (75 mg total) by mouth daily. 03/06/17  Yes Runell GessBerry, Jonathan J, MD  losartan (COZAAR) 50 MG tablet Take 1 tablet (50 mg total) by mouth daily. 01/25/17  Yes Bing NeighborsHarris, Rorey Hodges S, FNP  metoprolol tartrate (LOPRESSOR) 25 MG tablet Take 0.5 tablets (12.5 mg total) by mouth 2 (two) times daily. 01/25/17  Yes Bing NeighborsHarris, Chandlor Noecker S, FNP  nitroGLYCERIN (NITROSTAT) 0.4 MG SL tablet Place 1 tablet (0.4 mg total) under the tongue every 5 (five) minutes as needed. 12/14/16  Yes Bing NeighborsHarris, Chaniya Genter S, FNP    Past Medical, Surgical Family and Social History reviewed and updated.    Objective:   Today's Vitals   03/27/17 1307  BP: (!) 142/80  Pulse: 78  Temp: 98 F (36.7 C)  TempSrc: Oral  SpO2: 98%  Weight:  238 lb (108 kg)  Height: 5' 11.5" (1.816 m)    Wt Readings from Last 3 Encounters:  03/27/17 238 lb (108 kg)  03/06/17 233 lb (105.7 kg)  01/25/17 236 lb (107 kg)    Physical Exam  Constitutional: He is oriented to person, place, and time. He appears well-developed and well-nourished.  HENT:  Mouth/Throat: Mucous membranes are dry. Posterior oropharyngeal edema and posterior oropharyngeal erythema present. No tonsillar abscesses.  Displaced uvula and dry nasal turbinates   Eyes: Conjunctivae and EOM are normal. Pupils are equal, round, and reactive to light.  Cardiovascular: Normal rate, regular rhythm, normal heart sounds and intact distal pulses.  Pulmonary/Chest: Effort  normal and breath sounds normal.  Neurological: He is alert and oriented to person, place, and time.  Skin: Skin is warm and dry.  Psychiatric: He has a normal mood and affect. His behavior is normal. Judgment and thought content normal.   Assessment & Plan:  1. Pharyngitis, unspecified etiology 2. Nasal dryness  Patient was recently exposed to strep by his significant other.  He presents today with gross erythema at the posterior pharyngeal region and a displaced uvula with mild edema.  Will treat empirically with amoxicillin 875 twice daily for 7 days to cover strep pathogen.  He is advised if he feels worse or develops a fever while on antibiotic to return for further evaluation. Nares were also dry during exam today recommended Ocean saline nasal spray to moisten nasal turbinates.  Meds ordered this encounter  Medications  . amoxicillin (AMOXIL) 875 MG tablet    Sig: Take 1 tablet (875 mg total) by mouth 2 (two) times daily for 7 days.    Dispense:  14 tablet    Refill:  0    Order Specific Question:   Supervising Provider    Answer:   Quentin Angst [id number]  . sodium chloride (OCEAN) 0.65 % SOLN nasal spray    Sig: Place 1 spray into both nostrils as needed for congestion.    Dispense:  30 mL    Refill:  0    Order Specific Question:   Supervising Provider    Answer:   Quentin Angst [[id number]]    RTC: keep upcoming follow-up     Godfrey Pick. Tiburcio Pea, MSN, FNP-C [facility]  [address] [place], [place] [address] [phone]

## 2017-03-27 NOTE — Patient Instructions (Signed)

## 2017-04-01 MED FILL — METOPROLOL TARTRATE 25 MG T: 25 | 30 days supply | Qty: 60 | Fill #1

## 2017-04-01 MED FILL — ATORVASTATIN 80 MG TABLET: 80 | 30 days supply | Qty: 30 | Fill #1

## 2017-04-03 NOTE — Progress Notes (Signed)
Patient ID: Vincent CaulJohnny Stangeland                 DOB: 12/11/1955                      MRN: [id number]     HPI: Vincent [name] is a 62 y.o. male referred by Dr. Allyson SabalBerry to HTN clinic. PMH includes hypertension, hyperlipidemia, and STEMI. Patient reports hypertension history for more than 20 years with previous therapy including up to 7 different medication. During most recent visit with Dr Allyson SabalBerry on 03/06/2017 his blood pressure was elevated and amlodipine 5mg  was added to his therapy. Patient presents today and denies dizziness, swelling, chest pain or any other problem with current therapy. His metoprolol dose was previously decreased due to ED.   Current HTN meds:  Amlodipine 5mg  daily Losartan 50mg  daily Metoprolol tartrate 12.5mg  twice daily  Intolerance:  cardura - made him sick in the past  BP goal: 130/80  Family History: AAA (abdominal aortic aneurysm) (age of onset: 4090) in his maternal grandmother and paternal grandmother; CAD (age of onset: 4255) in his brother; CAD (age of onset: 2962) in his father; Cancer (age of onset: 8142) in his mother; 2 brothers died of heart complications.  Social History: Reports that  has never smoked. Last alcohol and marijuana ~ 2 weeks ago.   Diet: Heart healthy diet   Exercise: member to [employer] 3x/week work out  Home BP readings: none available today for assessment.  150s/90s per patient report  Wt Readings from Last 3 Encounters:  04/04/17 238 lb 12.8 oz (108.3 kg)  03/27/17 238 lb (108 kg)  03/06/17 233 lb (105.7 kg)   BP Readings from Last 3 Encounters:  04/04/17 (!) 148/82  03/27/17 (!) 142/80  03/06/17 (!) 145/82   Pulse Readings from Last 3 Encounters:  04/04/17 68  03/27/17 78  03/06/17 79    Past Medical History:  Diagnosis Date  . Hyperlipidemia   . Hypertension   . STEMI (ST elevation myocardial infarction) (HCC) 11/25/2016   small vessel dz at cath, med rx, nl EF    Current Outpatient Medications on File Prior to Visit    Medication Sig Dispense Refill  . aspirin 81 MG chewable tablet Chew 1 tablet (81 mg total) by mouth daily. 90 tablet 1  . atorvastatin (LIPITOR) 80 MG tablet Take 1 tablet (80 mg total) by mouth daily at 6 PM. 90 tablet 1  . clopidogrel (PLAVIX) 75 MG tablet Take 1 tablet (75 mg total) by mouth daily. 90 tablet 1  . losartan (COZAAR) 50 MG tablet Take 1 tablet (50 mg total) by mouth daily. 90 tablet 3  . metoprolol tartrate (LOPRESSOR) 25 MG tablet Take 0.5 tablets (12.5 mg total) by mouth 2 (two) times daily. 180 tablet 1  . sodium chloride (OCEAN) 0.65 % SOLN nasal spray Place 1 spray into both nostrils as needed for congestion. 30 mL 0  . nitroGLYCERIN (NITROSTAT) 0.4 MG SL tablet Place 1 tablet (0.4 mg total) under the tongue every 5 (five) minutes as needed. (Patient not taking: Reported on 04/04/2017) 25 tablet 2   No current facility-administered medications on file prior to visit.     No Known Allergies  Blood pressure (!) 148/82, pulse 68, height 6' (1.829 m), weight 238 lb 12.8 oz (108.3 kg), SpO2 97 %.  Essential hypertension Blood pressure remains above desired goal of 130/80 and no home BP reading are available to assessment. Patient denies problems  with current therapy and continues to follow appropriate lifestyle modifications. Will increase his blood amlodipine dose to 10mg  daily and follow up at HTN clinic in 4 weeks.   Ernestina Joe Rodriguez-Guzman PharmD, BCPS, CPP [facility] [address] [place] [address] 04/04/2017 9:56 AM

## 2017-04-04 ENCOUNTER — Ambulatory Visit (INDEPENDENT_AMBULATORY_CARE_PROVIDER_SITE_OTHER): Payer: Self-pay | Admitting: Pharmacist

## 2017-04-04 ENCOUNTER — Encounter: Payer: Self-pay | Admitting: Pharmacist

## 2017-04-04 VITALS — BP 148/82 | HR 68 | Ht 72.0 in | Wt 238.8 lb

## 2017-04-04 DIAGNOSIS — I10 Essential (primary) hypertension: Secondary | ICD-10-CM

## 2017-04-04 LAB — BASIC METABOLIC PANEL
BUN / CREAT RATIO: 10 (ref 10–24)
BUN: 10 mg/dL (ref 8–27)
CHLORIDE: 105 mmol/L (ref 96–106)
CO2: 26 mmol/L (ref 20–29)
Calcium: 9.1 mg/dL (ref 8.6–10.2)
Creatinine, Ser: 0.98 mg/dL (ref 0.76–1.27)
GFR calc non Af Amer: 83 mL/min/{1.73_m2} (ref >59)
GFR, EST AFRICAN AMERICAN: 96 mL/min/{1.73_m2} (ref >59)
Glucose: 108 mg/dL — ABNORMAL HIGH (ref 65–99)
POTASSIUM: 3.9 mmol/L (ref 3.5–5.2)
Sodium: 144 mmol/L (ref 134–144)

## 2017-04-04 LAB — LIPID PANEL
CHOL/HDL RATIO: 2.8 ratio (ref 0.0–5.0)
Cholesterol, Total: 132 mg/dL (ref 100–199)
HDL: 47 mg/dL (ref >39)
LDL CALC: 73 mg/dL (ref 0–99)
TRIGLYCERIDES: 62 mg/dL (ref 0–149)
VLDL Cholesterol Cal: 12 mg/dL (ref 5–40)

## 2017-04-04 LAB — HEPATIC FUNCTION PANEL
ALBUMIN: 4.5 g/dL (ref 3.6–4.8)
ALT: 44 IU/L (ref 0–44)
AST: 37 IU/L (ref 0–40)
Alkaline Phosphatase: 70 IU/L (ref 39–117)
Bilirubin Total: 0.5 mg/dL (ref 0.0–1.2)
Bilirubin, Direct: 0.16 mg/dL (ref 0.00–0.40)
TOTAL PROTEIN: 7 g/dL (ref 6.0–8.5)

## 2017-04-04 MED ORDER — AMLODIPINE BESYLATE 10 MG PO TABS: 10.0000 mg | ORAL_TABLET | Freq: Every day | ORAL | 3 refills | Status: DC

## 2017-04-04 MED FILL — AMLODIPINE BESYLATE 10 MG T: 10 | 30 days supply | Qty: 30 | Fill #0

## 2017-04-04 NOTE — Patient Instructions (Signed)
Return for a  follow up appointment in 4 weeks  Check your blood pressure at home daily (if able) and keep record of the readings.  Take your BP meds as follows: *INCREASE amlodipine to 10mg  daily* *ALL otrher medication as prescribed*  * Call to establish primary care physician [phone] [facility]* *Complete blood work today prior to leaving office*  Bring all of your meds, your BP cuff and your record of home blood pressures to your next appointment.  Exercise as you're able, try to walk approximately 30 minutes per day.  Keep salt intake to a minimum, especially watch canned and prepared boxed foods.  Eat more fresh fruits and vegetables and fewer canned items.  Avoid eating in fast food restaurants.    HOW TO TAKE YOUR BLOOD PRESSURE: . Rest 5 minutes before taking your blood pressure. .  Don't smoke or drink caffeinated beverages for at least 30 minutes before. . Take your blood pressure before (not after) you eat. . Sit comfortably with your back supported and both feet on the floor (don't cross your legs). . Elevate your arm to heart level on a table or a desk. . Use the proper sized cuff. It should fit smoothly and snugly around your bare upper arm. There should be enough room to slip a fingertip under the cuff. The bottom edge of the cuff should be 1 inch above the crease of the elbow. . Ideally, take 3 measurements at one sitting and record the average.

## 2017-04-04 NOTE — Assessment & Plan Note (Signed)
Blood pressure remains above desired goal of 130/80 and no home BP reading are available to assessment. Patient denies problems with current therapy and continues to follow appropriate lifestyle modifications. Will increase his blood amlodipine dose to 10mg  daily and follow up at HTN clinic in 4 weeks.

## 2017-04-23 MED FILL — LOSARTAN POTASSIUM 50 MG TA: 50 | 30 days supply | Qty: 30 | Fill #3

## 2017-04-29 MED FILL — ATORVASTATIN 80 MG TABLET: 80 | 30 days supply | Qty: 30 | Fill #2

## 2017-04-29 MED FILL — AMLODIPINE BESYLATE 10 MG T: 10 | 30 days supply | Qty: 30 | Fill #1

## 2017-05-07 ENCOUNTER — Ambulatory Visit: Payer: Self-pay

## 2017-05-07 NOTE — Progress Notes (Deleted)
Patient ID: Vincent [name]                 DOB: [birth year]                      MRN: [id number]     HPI: Vincent [name] is a 62 y.o. male referred by Dr. Allyson Sabal to HTN clinic. PMH includes hypertension, hyperlipidemia, and STEMI. Patient reports hypertension history for more than 20 years with previous therapy including up to 7 different medication. During most recent visit with Dr Allyson Sabal on 03/06/2017 his blood pressure was elevated and amlodipine  was added to his therapy. Patient presents today and denies dizziness, swelling, chest pain or any other problem with current therapy. His metoprolol dose was previously decreased due to ED.   Current HTN meds:  Amlodipine 10 mg daily Losartan 50 mg daily Metoprolol tartrate 12.5 mg twice daily  Intolerance:  cardura - made him sick in the past  BP goal: 130/80  Family History: AAA (abdominal aortic aneurysm) (age of onset: 54) in his maternal grandmother and paternal grandmother; CAD (age of onset: 53) in his brother; CAD (age of onset: 47) in his father; Cancer (age of onset: 46) in his mother; 2 brothers died of heart complications.  Social History: Reports that  has never smoked. Last alcohol and marijuana ~ 2 weeks ago.   Diet: Heart healthy diet   Exercise: member to [employer] 3x/week work out  Home BP readings: none available today for assessment.  150s/90s per patient report  Wt Readings from Last 3 Encounters:  04/04/17 238 lb 12.8 oz (108.3 kg)  03/27/17 238 lb (108 kg)  03/06/17 233 lb (105.7 kg)   BP Readings from Last 3 Encounters:  04/04/17 (!) 148/82  03/27/17 (!) 142/80  03/06/17 (!) 145/82   Pulse Readings from Last 3 Encounters:  04/04/17 68  03/27/17 78  03/06/17 79    Past Medical History:  Diagnosis Date  . Hyperlipidemia   . Hypertension   . STEMI (ST elevation myocardial infarction) (HCC) 11/25/2016   small vessel dz at cath, med rx, nl EF    Current Outpatient Medications on File Prior to Visit    Medication Sig Dispense Refill  . amLODipine (NORVASC) 10 MG tablet Take 1 tablet (10 mg total) by mouth daily. 30 tablet 3  . aspirin 81 MG chewable tablet Chew 1 tablet (81 mg total) by mouth daily. 90 tablet 1  . atorvastatin (LIPITOR) 80 MG tablet Take 1 tablet (80 mg total) by mouth daily at 6 PM. 90 tablet 1  . clopidogrel (PLAVIX) 75 MG tablet Take 1 tablet (75 mg total) by mouth daily. 90 tablet 1  . losartan (COZAAR) 50 MG tablet Take 1 tablet (50 mg total) by mouth daily. 90 tablet 3  . metoprolol tartrate (LOPRESSOR) 25 MG tablet Take 0.5 tablets (12.5 mg total) by mouth 2 (two) times daily. 180 tablet 1  . nitroGLYCERIN (NITROSTAT) 0.4 MG SL tablet Place 1 tablet (0.4 mg total) under the tongue every 5 (five) minutes as needed. (Patient not taking: Reported on 04/04/2017) 25 tablet 2  . sodium chloride (OCEAN) 0.65 % SOLN nasal spray Place 1 spray into both nostrils as needed for congestion. 30 mL 0   No current facility-administered medications on file prior to visit.     No Known Allergies  There were no vitals taken for this visit.  No problem-specific Assessment & Plan notes found for this encounter.  Raquel Rodriguez-Guzman PharmD, BCPS, CPP [facility] [address] [place] [address] 05/07/2017 7:03 AM

## 2017-05-23 ENCOUNTER — Ambulatory Visit (INDEPENDENT_AMBULATORY_CARE_PROVIDER_SITE_OTHER): Payer: Self-pay | Admitting: Pharmacist

## 2017-05-23 ENCOUNTER — Encounter: Payer: Self-pay | Admitting: Pharmacist

## 2017-05-23 VITALS — BP 128/70 | HR 72

## 2017-05-23 DIAGNOSIS — I10 Essential (primary) hypertension: Secondary | ICD-10-CM

## 2017-05-23 NOTE — Progress Notes (Signed)
Patient ID: Vincent [name]                 DOB: [birth year]                      MRN: [id number]     HPI: Vincent [name] is a 62 y.o. male referred by Dr. Allyson Sabal to HTN clinic. PMH includes hypertension, hyperlipidemia, and STEMI. Patient reports hypertension history for more than 20 years with previous therapy including up to 7 different medication. During most recent visiti increased his amlodipine to  daily. Patient presents today and denies dizziness, swelling, chest pain or any other problem with current therapy. His metoprolol dose was previously decreased due to ED.   Current HTN meds:  Amlodipine  daily Losartan  daily Metoprolol tartrate 12.5mg  twice daily  Intolerance:  cardura - made him sick in the past  BP goal: 130/80  Family History: AAA (abdominal aortic aneurysm) (age of onset: 13) in his maternal grandmother and paternal grandmother; CAD (age of onset: 32) in his brother; CAD (age of onset: 66) in his father; Cancer (age of onset: 18) in his mother; 2 brothers died of heart complications.  Social History: Reports that  has never smoked. Last alcohol and marijuana ~ 2 weeks ago.   Diet: Heart healthy diet   Exercise: member to [employer] 3x/week work out  Home BP readings: none available today for assessment.    Wt Readings from Last 3 Encounters:  04/04/17 238 lb 12.8 oz (108.3 kg)  03/27/17 238 lb (108 kg)  03/06/17 233 lb (105.7 kg)   BP Readings from Last 3 Encounters:  05/23/17 128/70  04/04/17 (!) 148/82  03/27/17 (!) 142/80   Pulse Readings from Last 3 Encounters:  05/23/17 72  04/04/17 68  03/27/17 78    Past Medical History:  Diagnosis Date  . Hyperlipidemia   . Hypertension   . STEMI (ST elevation myocardial infarction) (HCC) 11/25/2016   small vessel dz at cath, med rx, nl EF    Current Outpatient Medications on File Prior to Visit  Medication Sig Dispense Refill  . amLODipine (NORVASC) 10 MG tablet Take 1 tablet (10 mg total)  by mouth daily. 30 tablet 3  . aspirin 81 MG chewable tablet Chew 1 tablet (81 mg total) by mouth daily. 90 tablet 1  . atorvastatin (LIPITOR) 80 MG tablet Take 1 tablet (80 mg total) by mouth daily at 6 PM. 90 tablet 1  . clopidogrel (PLAVIX) 75 MG tablet Take 1 tablet (75 mg total) by mouth daily. 90 tablet 1  . losartan (COZAAR) 50 MG tablet Take 1 tablet (50 mg total) by mouth daily. 90 tablet 3  . metoprolol tartrate (LOPRESSOR) 25 MG tablet Take 0.5 tablets (12.5 mg total) by mouth 2 (two) times daily. 180 tablet 1  . sodium chloride (OCEAN) 0.65 % SOLN nasal spray Place 1 spray into both nostrils as needed for congestion. 30 mL 0  . nitroGLYCERIN (NITROSTAT) 0.4 MG SL tablet Place 1 tablet (0.4 mg total) under the tongue every 5 (five) minutes as needed. (Patient not taking: Reported on 04/04/2017) 25 tablet 2   No current facility-administered medications on file prior to visit.     No Known Allergies  Blood pressure 128/70, pulse 72, SpO2 95 %.  Essential hypertension Blood pressure today at goal. Patient continues to work on low sodium diet and regular exercise. Denies problems with current therapy. Will continue all medication without changes and follow up  as needed with HTN clinic.    Donnie Gedeon Rodriguez-Guzman PharmD, BCPS, CPP [facility] [address] [place] [address] 05/23/2017 5:25 PM

## 2017-05-23 NOTE — Assessment & Plan Note (Signed)
Blood pressure today at goal. Patient continues to work on low sodium diet and regular exercise. Denies problems with current therapy. Will continue all medication without changes and follow up as needed with HTN clinic.

## 2017-05-23 NOTE — Patient Instructions (Signed)
Return for a follow up appointment as needed   Check your blood pressure at home daily (if able) and keep record of the readings.  Take your BP meds as follows: No changes  Bring all of your meds, your BP cuff and your record of home blood pressures to your next appointment.  Exercise as you're able, try to walk approximately 30 minutes per day.  Keep salt intake to a minimum, especially watch canned and prepared boxed foods.  Eat more fresh fruits and vegetables and fewer canned items.  Avoid eating in fast food restaurants.    HOW TO TAKE YOUR BLOOD PRESSURE: . Rest 5 minutes before taking your blood pressure. .  Don't smoke or drink caffeinated beverages for at least 30 minutes before. . Take your blood pressure before (not after) you eat. . Sit comfortably with your back supported and both feet on the floor (don't cross your legs). . Elevate your arm to heart level on a table or a desk. . Use the proper sized cuff. It should fit smoothly and snugly around your bare upper arm. There should be enough room to slip a fingertip under the cuff. The bottom edge of the cuff should be 1 inch above the crease of the elbow. . Ideally, take 3 measurements at one sitting and record the average.

## 2017-06-12 MED FILL — ATORVASTATIN 80 MG TABLET: 80 | 30 days supply | Qty: 30 | Fill #3

## 2017-06-14 MED FILL — CLOPIDOGREL 75 MG TABLET: 75 | 90 days supply | Qty: 90 | Fill #1

## 2017-06-14 MED FILL — AMLODIPINE BESYLATE 10 MG T: 10 | 30 days supply | Qty: 30 | Fill #2

## 2017-06-14 MED FILL — LOSARTAN POTASSIUM 50 MG TA: 50 | 30 days supply | Qty: 30 | Fill #4

## 2017-06-30 ENCOUNTER — Encounter (HOSPITAL_BASED_OUTPATIENT_CLINIC_OR_DEPARTMENT_OTHER): Payer: Self-pay | Admitting: Emergency Medicine

## 2017-06-30 ENCOUNTER — Other Ambulatory Visit: Payer: Self-pay

## 2017-06-30 ENCOUNTER — Emergency Department (HOSPITAL_BASED_OUTPATIENT_CLINIC_OR_DEPARTMENT_OTHER)
Admission: EM | Admit: 2017-06-30 | Discharge: 2017-06-30 | Disposition: A | Discharge status: Home / Self Care | Payer: Self-pay | Attending: Emergency Medicine | Admitting: Emergency Medicine

## 2017-06-30 DIAGNOSIS — Y92410 Unspecified street and highway as the place of occurrence of the external cause: Secondary | ICD-10-CM | POA: Insufficient documentation

## 2017-06-30 DIAGNOSIS — M545 Low back pain, unspecified: Secondary | ICD-10-CM

## 2017-06-30 DIAGNOSIS — S161XXA Strain of muscle, fascia and tendon at neck level, initial encounter: Secondary | ICD-10-CM | POA: Insufficient documentation

## 2017-06-30 DIAGNOSIS — I10 Essential (primary) hypertension: Secondary | ICD-10-CM | POA: Insufficient documentation

## 2017-06-30 DIAGNOSIS — Y9389 Activity, other specified: Secondary | ICD-10-CM | POA: Insufficient documentation

## 2017-06-30 DIAGNOSIS — Y999 Unspecified external cause status: Secondary | ICD-10-CM | POA: Insufficient documentation

## 2017-06-30 MED ORDER — IBUPROFEN 800 MG PO TABS: 800.0000 mg | ORAL_TABLET | Freq: Three times a day (TID) | ORAL | 0 refills | Status: DC | PRN

## 2017-06-30 MED ORDER — METHOCARBAMOL 500 MG PO TABS: 500.0000 mg | ORAL_TABLET | Freq: Three times a day (TID) | ORAL | 0 refills | Status: DC | PRN

## 2017-06-30 NOTE — ED Notes (Signed)
ED Provider at bedside.Dr Mesner  

## 2017-06-30 NOTE — ED Notes (Signed)
Pt teaching provided on medications that may cause drowsiness. Pt instructed not to drive or operate heavy machinery while taking the prescribed medication. Pt verbalized understanding.   

## 2017-06-30 NOTE — ED Notes (Signed)
Patients HR is irregular in triage between 41 - 70 - Patient is a/o x 4 - denies any chest pain or SOB or distress. Patient states that he has some jitters yesterday [facility] - Patient states that he has an MI about 5 months ago. THe patient reports that he has a hx of irregular heart beat
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
## 2017-07-02 MED FILL — METHOCARBAMOL 500 MG TABLET: 500 | 7 days supply | Qty: 20 | Fill #0

## 2017-07-02 MED FILL — IBUPROFEN 800 MG TAB: 800 | 7 days supply | Qty: 21 | Fill #0

## 2017-07-09 ENCOUNTER — Other Ambulatory Visit: Payer: Self-pay | Admitting: Physician Assistant

## 2017-07-17 MED FILL — AMLODIPINE BESYLATE 10 MG T: 10 | 30 days supply | Qty: 30 | Fill #3

## 2017-07-17 MED FILL — LOSARTAN POTASSIUM 50 MG TA: 50 | 30 days supply | Qty: 30 | Fill #5

## 2017-07-26 ENCOUNTER — Ambulatory Visit (INDEPENDENT_AMBULATORY_CARE_PROVIDER_SITE_OTHER): Payer: Self-pay | Admitting: Family Medicine

## 2017-07-26 ENCOUNTER — Encounter: Payer: Self-pay | Admitting: Family Medicine

## 2017-07-26 VITALS — BP 126/82 | HR 84 | Temp 97.7°F | Ht 72.0 in | Wt 239.0 lb

## 2017-07-26 DIAGNOSIS — I252 Old myocardial infarction: Secondary | ICD-10-CM

## 2017-07-26 DIAGNOSIS — Z09 Encounter for follow-up examination after completed treatment for conditions other than malignant neoplasm: Secondary | ICD-10-CM

## 2017-07-26 DIAGNOSIS — R7303 Prediabetes: Secondary | ICD-10-CM

## 2017-07-26 DIAGNOSIS — I10 Essential (primary) hypertension: Secondary | ICD-10-CM

## 2017-07-26 LAB — POCT URINALYSIS DIP (MANUAL ENTRY)
Bilirubin, UA: NEGATIVE
Blood, UA: NEGATIVE
Glucose, UA: 100 mg/dL — AB
Ketones, POC UA: NEGATIVE mg/dL
Leukocytes, UA: NEGATIVE
Nitrite, UA: NEGATIVE
Protein Ur, POC: NEGATIVE mg/dL
Spec Grav, UA: 1.02 (ref 1.010–1.025)
Urobilinogen, UA: 0.2 E.U./dL
pH, UA: 7 (ref 5.0–8.0)

## 2017-07-26 LAB — POCT GLYCOSYLATED HEMOGLOBIN (HGB A1C): Hemoglobin A1C: 6.3 % — AB (ref 4.0–5.6)

## 2017-07-26 NOTE — Progress Notes (Signed)
Follow Up Appointment  Subjective:    Patient ID: Vincent [name], male    DOB: Feb 08, 1955, 62 y.o.   MRN: [id number]   PCP: Raliegh Ip, NP  Chief Complaint  Patient presents with  . Follow-up    6 month chronic condition   HPI  Vincent [name] has a past medical history of STEMI, Hypertension, and Hyperlipidemia. He is here for follow up.   Current Status: Since his last office visit, he is doing well with no complaints. He denies fevers, chills, fatigue, recent infections, weight loss, and night sweats. He has not had any headaches, visual changes, dizziness, and falls. No chest pain, heart palpitations, cough and shortness of breath reported. No reports of GI problems such as nausea, vomiting, diarrhea, and constipation. He has no reports of blood in stools, dysuria and hematuria. No depression or anxiety, and denies suicidal ideations, homicidal ideations, or auditory hallucinations. He denies pain today.   Past Medical History:  Diagnosis Date  . Hyperlipidemia   . Hypertension   . STEMI (ST elevation myocardial infarction) (HCC) 11/25/2016   small vessel dz at cath, med rx, nl EF    Family History  Problem Relation Age of Onset  . Cancer Mother 96  . CAD Father 31  . CAD Brother 36  . AAA (abdominal aortic aneurysm) Maternal Grandmother 90  . AAA (abdominal aortic aneurysm) Paternal Grandmother 72    Social History   Socioeconomic History  . Marital status: Single    Spouse name: Not on file  . Number of children: Not on file  . Years of education: Not on file  . Highest education level: Not on file  Occupational History  . Occupation: Patient assistance    Employer: [employer]  Social Needs  . Financial resource strain: Not on file  . Food insecurity:    Worry: Not on file    Inability: Not on file  . Transportation needs:    Medical: Not on file    Non-medical: Not on file  Tobacco Use  . Smoking status: Never Smoker  .  Smokeless tobacco: Never Used  Substance and Sexual Activity  . Alcohol use: No    Frequency: Never  . Drug use: No  . Sexual activity: Not on file  Lifestyle  . Physical activity:    Days per week: Not on file    Minutes per session: Not on file  . Stress: Not on file  Relationships  . Social connections:    Talks on phone: Not on file    Gets together: Not on file    Attends religious service: Not on file    Active member of club or organization: Not on file    Attends meetings of clubs or organizations: Not on file    Relationship status: Not on file  . Intimate partner violence:    Fear of current or ex partner: Not on file    Emotionally abused: Not on file    Physically abused: Not on file    Forced sexual activity: Not on file  Other Topics Concern  . Not on file  Social History Narrative  . Not on file    Family History  Problem Relation Age of Onset  . Cancer Mother 50  . CAD Father 52  . CAD Brother 54  . AAA (abdominal aortic aneurysm) Maternal Grandmother 90  . AAA (abdominal aortic aneurysm) Paternal Grandmother 12    Social History   Socioeconomic History  .  Marital status: Single    Spouse name: Not on file  . Number of children: Not on file  . Years of education: Not on file  . Highest education level: Not on file  Occupational History  . Occupation: Patient assistance    Employer: [employer]  Social Needs  . Financial resource strain: Not on file  . Food insecurity:    Worry: Not on file    Inability: Not on file  . Transportation needs:    Medical: Not on file    Non-medical: Not on file  Tobacco Use  . Smoking status: Never Smoker  . Smokeless tobacco: Never Used  Substance and Sexual Activity  . Alcohol use: No    Frequency: Never  . Drug use: No  . Sexual activity: Not on file  Lifestyle  . Physical activity:    Days per week: Not on file    Minutes per session: Not on file  . Stress: Not on file   Relationships  . Social connections:    Talks on phone: Not on file    Gets together: Not on file    Attends religious service: Not on file    Active member of club or organization: Not on file    Attends meetings of clubs or organizations: Not on file    Relationship status: Not on file  . Intimate partner violence:    Fear of current or ex partner: Not on file    Emotionally abused: Not on file    Physically abused: Not on file    Forced sexual activity: Not on file  Other Topics Concern  . Not on file  Social History Narrative  . Not on file    Past Surgical History:  Procedure Laterality Date  . LEFT HEART CATH AND CORONARY ANGIOGRAPHY N/A 11/25/2016   Procedure: LEFT HEART CATH AND CORONARY ANGIOGRAPHY;  Surgeon: Runell GessBerry, Jonathan J, MD;  Location: [facility];  Service: Cardiovascular;  Laterality: N/A;     There is no immunization history on file for this patient.   Current Meds  Medication Sig  . amLODipine (NORVASC) 10 MG tablet Take 1 tablet (10 mg total) by mouth daily.  Marland Kitchen. aspirin 81 MG chewable tablet Chew 1 tablet (81 mg total) by mouth daily.  Marland Kitchen. atorvastatin (LIPITOR) 80 MG tablet Take 1 tablet (80 mg total) by mouth daily at 6 PM.  . clopidogrel (PLAVIX) 75 MG tablet Take 1 tablet (75 mg total) by mouth daily.  Marland Kitchen. ibuprofen (ADVIL,MOTRIN) 800 MG tablet Take 1 tablet (800 mg total) by mouth every 8 (eight) hours as needed.  Marland Kitchen. losartan (COZAAR) 50 MG tablet Take 1 tablet (50 mg total) by mouth daily.  . methocarbamol (ROBAXIN) 500 MG tablet Take 1 tablet (500 mg total) by mouth every 8 (eight) hours as needed for muscle spasms.  . metoprolol tartrate (LOPRESSOR) 25 MG tablet Take 0.5 tablets (12.5 mg total) by mouth 2 (two) times daily.  . sodium chloride (OCEAN) 0.65 % SOLN nasal spray Place 1 spray into both nostrils as needed for congestion.    No Known Allergies  BP 126/82 (BP Location: Right Arm, Patient Position: Sitting, Cuff Size: Large)   Pulse  84   Temp 97.7 F (36.5 C) (Oral)   Ht 6' (1.829 m)   Wt 239 lb (108.4 kg)   SpO2 98%   BMI 32.41 kg/m    Review of Systems  Constitutional: Negative.   HENT: Negative.  Eyes: Negative.   Respiratory: Negative.   Cardiovascular: Negative.   Gastrointestinal: Negative.   Endocrine: Negative.   Genitourinary: Negative.   Musculoskeletal: Negative.   Skin: Negative.   Allergic/Immunologic: Negative.   Neurological: Negative.   Hematological: Negative.   Psychiatric/Behavioral: Negative.    Objective:   Physical Exam  Constitutional: He is oriented to person, place, and time. He appears well-developed and well-nourished.  HENT:  Head: Normocephalic and atraumatic.  Right Ear: External ear normal.  Left Ear: External ear normal.  Nose: Nose normal.  Mouth/Throat: Oropharynx is clear and moist.  Eyes: Pupils are equal, round, and reactive to light. Conjunctivae and EOM are normal.  Neck: Normal range of motion. Neck supple.  Cardiovascular: Normal rate, regular rhythm, normal heart sounds and intact distal pulses.  Pulmonary/Chest: Effort normal and breath sounds normal.  Abdominal: Soft. Bowel sounds are normal.  Musculoskeletal: Normal range of motion.  Neurological: He is alert and oriented to person, place, and time.  Skin: Skin is warm and dry. Capillary refill takes less than 2 seconds.  Psychiatric: He has a normal mood and affect. His behavior is normal. Judgment and thought content normal.  Nursing note and vitals reviewed.  Assessment & Plan:   1. Essential hypertension Blood pressure is 126/82 today. He will continue Amlodipine, Cozaar, and Metoprolol as prescribed. She will continue to decrease high sodium intake, excessive alcohol intake, increase potassium intake, smoking cessation, and increase physical activity of at least 30 minutes of cardio activity daily. She will continue to follow Heart Healthy or DASH diet.  2. History of ST elevation myocardial  infarction (STEMI) He is s/p: STEMI on 11/25/2016. He is doing well. Continue Plavix and ASA as prescribed.   3. Prediabetes Hgb A1c is 6.3 today, mildly increased from 5.8 on 11/26/2016. He will continue to decrease foods/beverages high in sugars and carbs and follow Heart Healthy or DASH diet. Increase physical activity to at least 30 minutes cardio exercise daily.   - POCT glycosylated hemoglobin (Hb A1C) - POCT urinalysis dipstick  4. Follow up She will follow up in 6 months.   No orders of the defined types were placed in this encounter.   Raliegh Ip,  MSN, FNP-C Patient Care [facility] [address] [place], [place] [address] [phone]

## 2017-07-30 ENCOUNTER — Other Ambulatory Visit: Payer: Self-pay | Admitting: Physician Assistant

## 2017-07-30 MED FILL — ATORVASTATIN 80 MG TABLET: 80 | 30 days supply | Qty: 30 | Fill #4

## 2017-07-30 MED FILL — METOPROLOL TARTRATE 25 MG T: 25 | 30 days supply | Qty: 60 | Fill #0

## 2017-07-30 NOTE — Telephone Encounter (Signed)
This Dr. Berry's pt 

## 2017-08-13 ENCOUNTER — Other Ambulatory Visit: Payer: Self-pay | Admitting: Cardiovascular Disease

## 2017-08-13 MED FILL — AMLODIPINE BESYLATE 10 MG T: 10 | 30 days supply | Qty: 30 | Fill #0

## 2017-08-13 MED FILL — LOSARTAN POTASSIUM 50 MG TA: 50 | 30 days supply | Qty: 30 | Fill #6

## 2017-09-18 ENCOUNTER — Other Ambulatory Visit: Payer: Self-pay | Admitting: Cardiovascular Disease

## 2017-09-18 MED FILL — LOSARTAN POTASSIUM 50 MG TA: 50 | 30 days supply | Qty: 30 | Fill #7

## 2017-09-18 MED FILL — AMLODIPINE BESYLATE 10 MG T: 10 | 90 days supply | Qty: 90 | Fill #1

## 2017-09-18 MED FILL — METOPROLOL TARTRATE 25 MG T: 25 | 30 days supply | Qty: 60 | Fill #1

## 2017-09-18 MED FILL — CLOPIDOGREL 75 MG TABLET: 75 | 90 days supply | Qty: 90 | Fill #0

## 2017-09-18 NOTE — Telephone Encounter (Signed)
Rx request sent to pharmacy.  

## 2017-10-16 ENCOUNTER — Encounter (HOSPITAL_BASED_OUTPATIENT_CLINIC_OR_DEPARTMENT_OTHER): Payer: Self-pay

## 2017-10-16 ENCOUNTER — Other Ambulatory Visit: Payer: Self-pay

## 2017-10-16 ENCOUNTER — Emergency Department (HOSPITAL_BASED_OUTPATIENT_CLINIC_OR_DEPARTMENT_OTHER)
Admission: EM | Admit: 2017-10-16 | Discharge: 2017-10-16 | Disposition: A | Discharge status: Home / Self Care | Payer: Self-pay | Attending: Emergency Medicine | Admitting: Emergency Medicine

## 2017-10-16 DIAGNOSIS — R202 Paresthesia of skin: Secondary | ICD-10-CM | POA: Insufficient documentation

## 2017-10-16 DIAGNOSIS — M10071 Idiopathic gout, right ankle and foot: Secondary | ICD-10-CM | POA: Insufficient documentation

## 2017-10-16 DIAGNOSIS — Z79899 Other long term (current) drug therapy: Secondary | ICD-10-CM | POA: Insufficient documentation

## 2017-10-16 DIAGNOSIS — Z7982 Long term (current) use of aspirin: Secondary | ICD-10-CM | POA: Insufficient documentation

## 2017-10-16 DIAGNOSIS — I10 Essential (primary) hypertension: Secondary | ICD-10-CM | POA: Insufficient documentation

## 2017-10-16 DIAGNOSIS — M109 Gout, unspecified: Secondary | ICD-10-CM

## 2017-10-16 DIAGNOSIS — I252 Old myocardial infarction: Secondary | ICD-10-CM | POA: Insufficient documentation

## 2017-10-16 DIAGNOSIS — Z7901 Long term (current) use of anticoagulants: Secondary | ICD-10-CM | POA: Insufficient documentation

## 2017-10-16 HISTORY — DX: Gout, unspecified: M10.9

## 2017-10-16 MED ORDER — METHOCARBAMOL 500 MG PO TABS: 500.0000 mg | ORAL_TABLET | Freq: Two times a day (BID) | ORAL | 0 refills | Status: DC

## 2017-10-16 MED ORDER — PREDNISONE 20 MG PO TABS: 60.0000 mg | ORAL_TABLET | Freq: Every day | ORAL | 0 refills | Status: DC

## 2017-10-16 MED ORDER — HYDROCODONE-ACETAMINOPHEN 5-325 MG PO TABS: 1.0000 | ORAL_TABLET | Freq: Four times a day (QID) | ORAL | 0 refills | Status: DC | PRN

## 2017-10-16 NOTE — ED Notes (Signed)
ED Provider at bedside. 

## 2017-10-16 NOTE — ED Provider Notes (Signed)
[facility] EMERGENCY DEPARTMENT Provider Note   CSN: [id number] Arrival date & time: 10/16/17  1943     History   Chief Complaint Chief Complaint  Patient presents with  . Gout    HPI Vincent [name] is a 62 y.o. male with history of hypertension, gout, hyperlipidemia, MI who presents with a 2-day history of right great toe pain as well as a 2-day history of intermittent paresthesias to his right hand.  Patient reports the symptoms began around the same time.  He also reports heavy lifting and working out the day before.  He has some tightness and pain to his right upper shoulder.  He has been taking colchicine and ibuprofen without relief.  Patient reports that the great toe feels typical of his gout he has had in the past.  HPI  Past Medical History:  Diagnosis Date  . Gout   . Hyperlipidemia   . Hypertension   . STEMI (ST elevation myocardial infarction) (HCC) 11/25/2016   small vessel dz at cath, med rx, nl EF    Patient Active Problem List   Diagnosis Date Noted  . 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 ST elevation myocardial infarction (STEMI) (HCC) 11/25/2016  . Essential hypertension   . Hyperlipidemia   . Hypokalemia     Past Surgical History:  Procedure Laterality Date  . LEFT HEART CATH AND CORONARY ANGIOGRAPHY N/A 11/25/2016   Procedure: LEFT HEART CATH AND CORONARY ANGIOGRAPHY;  Surgeon: Runell Gess, MD;  Location: [facility] INVASIVE CV LAB;  Service: Cardiovascular;  Laterality: N/A;        Home Medications    Prior to Admission medications   Medication Sig Start Date End Date Taking? Authorizing Provider  amLODipine (NORVASC) 10 MG tablet TAKE 1 TABLET (10 MG TOTAL) BY MOUTH DAILY. 08/13/17   Runell Gess, MD  aspirin 81 MG chewable tablet Chew 1 tablet (81 mg total) by mouth daily. 01/25/17   Bing Neighbors, FNP  atorvastatin (LIPITOR) 80 MG tablet Take 1 tablet (80 mg total) by mouth daily at  6 PM. 01/25/17   Bing Neighbors, FNP  clopidogrel (PLAVIX) 75 MG tablet TAKE 1 TABLET BY MOUTH DAILY. 09/18/17   Runell Gess, MD  HYDROcodone-acetaminophen (NORCO/VICODIN) 5-325 MG tablet Take 1-2 tablets by mouth every 6 (six) hours as needed. 10/16/17   Lorrie Gargan, Waylan Boga, PA-C  ibuprofen (ADVIL,MOTRIN) 800 MG tablet Take 1 tablet (800 mg total) by mouth every 8 (eight) hours as needed. 06/30/17   Mesner, Barbara Cower, MD  losartan (COZAAR) 50 MG tablet Take 1 tablet (50 mg total) by mouth daily. 01/25/17   Bing Neighbors, FNP  methocarbamol (ROBAXIN) 500 MG tablet Take 1 tablet (500 mg total) by mouth 2 (two) times daily. 10/16/17   Oluchi Pucci, Waylan Boga, PA-C  metoprolol tartrate (LOPRESSOR) 25 MG tablet Take 0.5 tablets (12.5 mg total) by mouth 2 (two) times daily. 07/10/17   Barrett, Joline Salt, PA-C  metoprolol tartrate (LOPRESSOR) 25 MG tablet TAKE 1 TABLET (25 MG TOTAL) BY MOUTH 2 (TWO) TIMES DAILY. 07/30/17   Barrett, Joline Salt, PA-C  nitroGLYCERIN (NITROSTAT) 0.4 MG SL tablet Place 1 tablet (0.4 mg total) under the tongue every 5 (five) minutes as needed. Patient not taking: Reported on 07/26/2017 12/14/16   Bing Neighbors, FNP  predniSONE (DELTASONE) 20 MG tablet Take 3 tablets (60 mg total) by mouth daily. 10/16/17   Daiden Coltrane, Waylan Boga, PA-C  sodium chloride (OCEAN)  0.65 % SOLN nasal spray Place 1 spray into both nostrils as needed for congestion. 03/27/17   Bing Neighbors, FNP    Family History Family History  Problem Relation Age of Onset  . Cancer Mother 26  . CAD Father 21  . CAD Brother 80  . AAA (abdominal aortic aneurysm) Maternal Grandmother 90  . AAA (abdominal aortic aneurysm) Paternal Grandmother 47    Social History Social History   Tobacco Use  . Smoking status: Never Smoker  . Smokeless tobacco: Never Used  Substance Use Topics  . Alcohol use: Yes    Frequency: Never    Comment: occ  . Drug use: No     Allergies   Patient has no known allergies.   Review of  Systems Review of Systems  Constitutional: Negative for fever.  Respiratory: Negative for shortness of breath.   Cardiovascular: Negative for chest pain.  Musculoskeletal: Positive for arthralgias, joint swelling and myalgias.  Neurological: Negative for numbness (paresthesia).     Physical Exam Updated Vital Signs BP (!) 160/91 (BP Location: Left Arm)   Pulse 87   Temp 98.9 F (37.2 C) (Oral)   Resp 18   SpO2 99%   Physical Exam  Constitutional: He appears well-developed and well-nourished. No distress.  HENT:  Head: Normocephalic and atraumatic.  Mouth/Throat: Oropharynx is clear and moist. No oropharyngeal exudate.  Eyes: Pupils are equal, round, and reactive to light. Conjunctivae are normal. Right eye exhibits no discharge. Left eye exhibits no discharge. No scleral icterus.  Neck: Normal range of motion. Neck supple. No thyromegaly present.  Cardiovascular: Normal rate, regular rhythm and normal heart sounds. Exam reveals no gallop and no friction rub.  No murmur heard. Pulmonary/Chest: Effort normal and breath sounds normal. No stridor. No respiratory distress. He has no wheezes. He has no rales.  Abdominal: Soft. Bowel sounds are normal. He exhibits no distension. There is no tenderness. There is no rebound and no guarding.  Musculoskeletal: He exhibits no edema.  Tenderness and spasm noted to right upper trapezius, no midline cervical, thoracic, or lumbar tenderness Tenderness to the right great toe, even with light touch, no significant warmth, mild swelling noted, no erythema  Lymphadenopathy:    He has no cervical adenopathy.  Neurological: He is alert. Coordination normal.  CN 3-12 intact; normal sensation throughout, except for paresthesia to R 1-4 digits; 5/5 strength in all 4 extremities; equal bilateral grip strength  Skin: Skin is warm and dry. No rash noted. He is not diaphoretic. No pallor.  Psychiatric: He has a normal mood and affect.  Nursing note and  vitals reviewed.    ED Treatments / Results  Labs (all labs ordered are listed, but only abnormal results are displayed) Labs Reviewed - No data to display  EKG None  Radiology No results found.  Procedures Procedures (including critical care time)  Medications Ordered in ED Medications - No data to display   Initial Impression / Assessment and Plan / ED Course  I have reviewed the triage vital signs and the nursing notes.  Pertinent labs & imaging results that were available during my care of the patient were reviewed by me and considered in my medical decision making (see chart for details).     Patient with probable gout flare of the right great toe.  Patient also with paresthesias to the right hand, most likely cervical radiculopathy, as patient has spasms of the right upper trapezius.  Will discharge home with short course of  Norco for pain control for gout as well as prednisone.  Patient has been taking colchicine without relief at home.  Patient is also on Plavix so NSAIDs should be avoided.  Will give short course of Robaxin for muscle spasm, but advised not to combine with Norco.  Heat and ice discussed, as well as stretching.  Follow-up to PCP/Ortho for further evaluation and treatment.  Return precautions discussed.  Patient understands and agrees with plan.  Patient vitals stable throughout ED course and discharged in satisfactory condition.  I reviewed the [place] narcotic database and found no discrepancies.  Final Clinical Impressions(s) / ED Diagnoses   Final diagnoses:  Acute gout involving toe of right foot, unspecified cause  Paresthesia    ED Discharge Orders         Ordered    HYDROcodone-acetaminophen (NORCO/VICODIN) 5-325 MG tablet  Every 6 hours PRN     10/16/17 2125    predniSONE (DELTASONE) 20 MG tablet  Daily     10/16/17 2125    methocarbamol (ROBAXIN) 500 MG tablet  2 times daily     10/16/17 2125           Emi Holes, PA-C 10/17/17  0018    Tilden Fossa, MD 10/17/17 [phone]

## 2017-10-16 NOTE — ED Triage Notes (Signed)
Pt c/o possible gout to right great toe x 4 days-right hand pain x 2 days-NAD-steady gait

## 2017-10-16 NOTE — Discharge Instructions (Signed)
Take prednisone until completed.  Take 1-2 Norco every 6 hours as needed for severe pain.  Take Robaxin twice daily as needed for muscle pain and spasms. This is meant to improve the tingling you are experiencing in your hand. Do not drive or operate machinery while taking Norco or Robaxin.  Do not combine these medications as they are both sedating medications.  Use ice and heat to your back and shoulders alternating 20 minutes on, 20 minutes off.  Attempt the stretches we discussed a few times daily.  Do not drink alcohol, drive, operate machinery or participate in any other potentially dangerous activities while taking opiate pain medication as it may make you sleepy. Do not take this medication with any other sedating medications, either prescription or over-the-counter. If you were prescribed Percocet or Vicodin, do not take these with acetaminophen (Tylenol) as it is already contained within these medications and overdose of Tylenol is dangerous.   This medication is an opiate (or narcotic) pain medication and can be habit forming.  Use it as little as possible to achieve adequate pain control.  Do not use or use it with extreme caution if you have a history of opiate abuse or dependence. This medication is intended for your use only - do not give any to anyone else and keep it in a secure place where nobody else, especially children, have access to it. It will also cause or worsen constipation, so you may want to consider taking an over-the-counter stool softener while you are taking this medication.

## 2017-10-16 NOTE — ED Notes (Signed)
Pt verbalizes understanding of d/c instructions and denies any further needs at this time. 

## 2017-10-25 MED FILL — LOSARTAN POTASSIUM 50 MG TA: 50 | 30 days supply | Qty: 30 | Fill #8

## 2017-12-04 MED FILL — LOSARTAN POTASSIUM 50 MG TA: 50 | 30 days supply | Qty: 30 | Fill #9

## 2017-12-18 ENCOUNTER — Encounter (HOSPITAL_BASED_OUTPATIENT_CLINIC_OR_DEPARTMENT_OTHER): Payer: Self-pay | Admitting: *Deleted

## 2017-12-18 ENCOUNTER — Emergency Department (HOSPITAL_BASED_OUTPATIENT_CLINIC_OR_DEPARTMENT_OTHER)
Admission: EM | Admit: 2017-12-18 | Discharge: 2017-12-18 | Disposition: A | Discharge status: Home / Self Care | Payer: Self-pay | Attending: Emergency Medicine | Admitting: Emergency Medicine

## 2017-12-18 ENCOUNTER — Other Ambulatory Visit: Payer: Self-pay

## 2017-12-18 DIAGNOSIS — I10 Essential (primary) hypertension: Secondary | ICD-10-CM | POA: Insufficient documentation

## 2017-12-18 DIAGNOSIS — Y92410 Unspecified street and highway as the place of occurrence of the external cause: Secondary | ICD-10-CM | POA: Insufficient documentation

## 2017-12-18 DIAGNOSIS — Z955 Presence of coronary angioplasty implant and graft: Secondary | ICD-10-CM | POA: Insufficient documentation

## 2017-12-18 DIAGNOSIS — I252 Old myocardial infarction: Secondary | ICD-10-CM | POA: Insufficient documentation

## 2017-12-18 DIAGNOSIS — Y939 Activity, unspecified: Secondary | ICD-10-CM | POA: Insufficient documentation

## 2017-12-18 DIAGNOSIS — S39012A Strain of muscle, fascia and tendon of lower back, initial encounter: Secondary | ICD-10-CM | POA: Insufficient documentation

## 2017-12-18 DIAGNOSIS — Z79899 Other long term (current) drug therapy: Secondary | ICD-10-CM | POA: Insufficient documentation

## 2017-12-18 DIAGNOSIS — Z7982 Long term (current) use of aspirin: Secondary | ICD-10-CM | POA: Insufficient documentation

## 2017-12-18 DIAGNOSIS — Y999 Unspecified external cause status: Secondary | ICD-10-CM | POA: Insufficient documentation

## 2017-12-18 MED ORDER — ACETAMINOPHEN 325 MG PO TABS
650.0000 mg | ORAL_TABLET | Freq: Once | ORAL | Status: AC
  Administered 2017-12-18: 650 mg via ORAL
  Filled 2017-12-18: qty 2

## 2017-12-18 MED ORDER — METHOCARBAMOL 500 MG PO TABS: 500.0000 mg | ORAL_TABLET | Freq: Two times a day (BID) | ORAL | 0 refills | Status: DC
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
## 2017-12-18 NOTE — ED Notes (Signed)
Pt verbalizes understanding of d/c instructions and denies any further needs at this time. 

## 2018-01-10 ENCOUNTER — Other Ambulatory Visit: Payer: Self-pay | Admitting: Family Medicine

## 2018-01-10 ENCOUNTER — Other Ambulatory Visit: Payer: Self-pay | Admitting: Physician Assistant

## 2018-01-10 ENCOUNTER — Other Ambulatory Visit: Payer: Self-pay | Admitting: Cardiovascular Disease

## 2018-01-10 MED FILL — AMLODIPINE BESYLATE 10 MG T: 10 | 90 days supply | Qty: 30 | Fill #0

## 2018-01-10 MED FILL — CLOPIDOGREL 75 MG TABLET: 75 | 90 days supply | Qty: 90 | Fill #1

## 2018-01-10 MED FILL — METOPROLOL TARTRATE 25 MG T: 25 | 30 days supply | Qty: 60 | Fill #0

## 2018-01-13 MED FILL — LOSARTAN POTASSIUM 50 MG TA: 50 | 90 days supply | Qty: 90 | Fill #0

## 2018-01-27 ENCOUNTER — Ambulatory Visit: Payer: Self-pay | Admitting: Family Medicine

## 2018-03-10 MED FILL — METOPROLOL TARTRATE 25 MG T: 25 | 30 days supply | Qty: 60 | Fill #1

## 2018-03-10 MED FILL — AMLODIPINE BESYLATE 10 MG T: 10 | 90 days supply | Qty: 30 | Fill #1

## 2018-04-02 ENCOUNTER — Other Ambulatory Visit: Payer: Self-pay | Admitting: Cardiovascular Disease

## 2018-04-02 ENCOUNTER — Other Ambulatory Visit: Payer: Self-pay | Admitting: Physician Assistant

## 2018-04-02 NOTE — Telephone Encounter (Signed)
This is Dr. Berry's pt 

## 2018-04-25 ENCOUNTER — Telehealth: Payer: Self-pay | Admitting: Cardiovascular Disease

## 2018-04-25 NOTE — Telephone Encounter (Signed)
  LVM for patient to schedule his overdue 1 yr f/u with Dr Allyson Sabal for the week of 4/20/-4/24.

## 2018-04-29 ENCOUNTER — Telehealth: Payer: Self-pay | Admitting: Cardiovascular Disease

## 2018-04-29 NOTE — Telephone Encounter (Signed)
Smartphone, pre reg complete 04/29/18 AF

## 2018-04-30 ENCOUNTER — Telehealth: Payer: Self-pay | Admitting: *Deleted

## 2018-04-30 ENCOUNTER — Other Ambulatory Visit: Payer: Self-pay

## 2018-04-30 ENCOUNTER — Telehealth: Payer: Self-pay

## 2018-04-30 ENCOUNTER — Telehealth (INDEPENDENT_AMBULATORY_CARE_PROVIDER_SITE_OTHER): Payer: Self-pay | Admitting: Cardiovascular Disease

## 2018-04-30 DIAGNOSIS — I252 Old myocardial infarction: Secondary | ICD-10-CM

## 2018-04-30 DIAGNOSIS — I10 Essential (primary) hypertension: Secondary | ICD-10-CM

## 2018-04-30 DIAGNOSIS — E785 Hyperlipidemia, unspecified: Secondary | ICD-10-CM

## 2018-04-30 DIAGNOSIS — I2102 ST elevation (STEMI) myocardial infarction involving left anterior descending coronary artery: Secondary | ICD-10-CM

## 2018-04-30 DIAGNOSIS — I2510 Atherosclerotic heart disease of native coronary artery without angina pectoris: Secondary | ICD-10-CM

## 2018-04-30 DIAGNOSIS — E782 Mixed hyperlipidemia: Secondary | ICD-10-CM

## 2018-04-30 MED ORDER — AMLODIPINE BESYLATE 10 MG PO TABS: 10.0000 mg | ORAL_TABLET | Freq: Every day | ORAL | 3 refills | Status: DC

## 2018-04-30 MED ORDER — METOPROLOL TARTRATE 25 MG PO TABS: 12.5000 mg | ORAL_TABLET | Freq: Two times a day (BID) | ORAL | 4 refills | Status: DC

## 2018-04-30 MED ORDER — ATORVASTATIN CALCIUM 80 MG PO TABS: 80.0000 mg | ORAL_TABLET | Freq: Every day | ORAL | 1 refills | Status: DC

## 2018-04-30 MED ORDER — LOSARTAN POTASSIUM 50 MG PO TABS: 50.0000 mg | ORAL_TABLET | Freq: Every day | ORAL | 3 refills | Status: DC

## 2018-04-30 NOTE — Telephone Encounter (Signed)
Error

## 2018-04-30 NOTE — Progress Notes (Signed)
Virtual Visit via Video Note   This visit type was conducted due to national recommendations for restrictions regarding the COVID-19 Pandemic (e.g. social distancing) in an effort to limit this patient's exposure and mitigate transmission in our community.  Due to his co-morbid illnesses, this patient is at least at moderate risk for complications without adequate follow up.  This format is felt to be most appropriate for this patient at this time.  All issues noted in this document were discussed and addressed.  A limited physical exam was performed with this format.  Please refer to the patient's chart for his consent to telehealth for [facility] [address]   Evaluation Performed:  Follow-up visit  Date:  04/30/2018   ID:  Vincent [name], DOB 1955/07/13, MRN [id number]  Patient Location: Home Provider Location: Home  PCP:  Kallie Locks, FNP  Cardiologist: Dr. Nanetta Batty Electrophysiologist:  None   Chief Complaint: 1 year follow-up CAD  History of Present Illness:    Vincent [name] is a 63 y.o. mildly overweight although fit appearing engaged African-American male father of 4 children who currently is unemployed but previously was working with his fiancs and now wife at the [employer] and [employer]. He presented on 11/25/16 with chest pain and lateral ST segment elevation. I catheterized him radially revealing high-grade diagonal branch stenosis and a small to medium-sized diagonal branch. He also had some scattered noncritical disease otherwise with a normal EF. His troponin peaked at 5 and he was discharged home on aspirin and Brilenta, and has since transitioned to clopidogrel.  His other problems include treated hypertension and hyperlipidemia. He does not smoke.   Since I saw him over a year ago he is remained stable.  He takes his blood pressure at home and it usually fairly good.  He denies chest pain or shortness of breath.  He remains on dual antiplatelet  therapy.  The patient does not have symptoms concerning for COVID-19 infection (fever, chills, cough, or new shortness of breath).    Past Medical History:  Diagnosis Date  . Gout   . Hyperlipidemia   . Hypertension   . STEMI (ST elevation myocardial infarction) (HCC) 11/25/2016   small vessel dz at cath, med rx, nl EF   Past Surgical History:  Procedure Laterality Date  . LEFT HEART CATH AND CORONARY ANGIOGRAPHY N/A 11/25/2016   Procedure: LEFT HEART CATH AND CORONARY ANGIOGRAPHY;  Surgeon: Runell Gess, MD;  Location: [facility] INVASIVE CV LAB;  Service: Cardiovascular;  Laterality: N/A;     Current Meds  Medication Sig  . amLODipine (NORVASC) 10 MG tablet TAKE 1 TABLET (10 MG TOTAL) BY MOUTH DAILY.  Marland Kitchen aspirin 81 MG chewable tablet Chew 1 tablet (81 mg total) by mouth daily.  Marland Kitchen atorvastatin (LIPITOR) 80 MG tablet Take 1 tablet (80 mg total) by mouth daily at 6 PM.  . clopidogrel (PLAVIX) 75 MG tablet TAKE 1 TABLET BY MOUTH DAILY.  Marland Kitchen losartan (COZAAR) 50 MG tablet TAKE 1 TABLET (50 MG TOTAL) BY MOUTH DAILY.  . metoprolol tartrate (LOPRESSOR) 25 MG tablet Take 0.5 tablets (12.5 mg total) by mouth 2 (two) times daily.  . nitroGLYCERIN (NITROSTAT) 0.4 MG SL tablet Place 1 tablet (0.4 mg total) under the tongue every 5 (five) minutes as needed.  . sodium chloride (OCEAN) 0.65 % SOLN nasal spray Place 1 spray into both nostrils as needed for congestion.  . [DISCONTINUED] methocarbamol (ROBAXIN) 500 MG tablet Take 1 tablet (500 mg total)  by mouth 2 (two) times daily.  . [DISCONTINUED] metoprolol tartrate (LOPRESSOR) 25 MG tablet Take 0.5 tablets (12.5 mg total) by mouth 2 (two) times daily. (BETA BLOCKER)     Allergies:   Patient has no known allergies.   Social History   Tobacco Use  . Smoking status: Never Smoker  . Smokeless tobacco: Never Used  Substance Use Topics  . Alcohol use: Yes    Frequency: Never    Comment: occ  . Drug use: No     Family Hx: The patient's family  history includes AAA (abdominal aortic aneurysm) (age of onset: 2690) in his maternal grandmother and paternal grandmother; CAD (age of onset: 6455) in his brother; CAD (age of onset: 3262) in his father; Cancer (age of onset: 3042) in his mother.  ROS:   Please see the history of present illness.     All other systems reviewed and are negative.   Prior CV studies:   The following studies were reviewed today:  None  Labs/Other Tests and Data Reviewed:    EKG:  No ECG reviewed.  Recent Labs: No results found for requested labs within last 8760 hours.   Recent Lipid Panel Lab Results  Component Value Date/Time   CHOL 132 04/04/2017 09:07 AM   TRIG 62 04/04/2017 09:07 AM   HDL 47 04/04/2017 09:07 AM   CHOLHDL 2.8 04/04/2017 09:07 AM   CHOLHDL 4.0 11/26/2016 01:45 AM   LDLCALC 73 04/04/2017 09:07 AM    Wt Readings from Last 3 Encounters:  04/30/18 235 lb (106.6 kg)  12/18/17 240 lb (108.9 kg)  07/26/17 239 lb (108.4 kg)     Objective:    Vital Signs:  Ht 5\' 11"  (1.803 m)   Wt 235 lb (106.6 kg)   BMI 32.78 kg/m    VITAL SIGNS:  reviewed GEN:  no acute distress RESPIRATORY:  normal respiratory effort, symmetric expansion NEURO:  alert and oriented x 3, no obvious focal deficit PSYCH:  normal affect  ASSESSMENT & PLAN:    1. Coronary artery disease- history of CAD status post lateral STEMI 11/25/2016.  Catheterized him via the right radial approach revealing noncritical CAD except for high-grade small diagonal branch stenosis which I elected to treat medically.  His troponin peaked at 5 and his EF was normal.  He has had no recurrent symptoms.  He currently is on aspirin and Plavix 2. Essential hypertension- history of essential hypertension with blood pressures usually running in the normal range however [facility] measured 156/91.  He is on metoprolol, losartan and amlodipine 3. Hyperlipidemia- history of hyperlipidemia  on atorvastatin with lipid profile performed 04/05/2017 revealed a total cholesterol 132, LDL 73 and HDL 47.  COVID-19 Education: The signs and symptoms of COVID-19 were discussed with the patient and how to seek care for testing (follow up with PCP or arrange E-visit).  The importance of social distancing was discussed today.  Time:   Today, I have spent 6 minutes with the patient with telehealth technology discussing the above problems.     Medication Adjustments/Labs and Tests Ordered: Current medicines are reviewed at length with the patient today.  Concerns regarding medicines are outlined above.   Tests Ordered: No orders of the defined types were placed in this encounter.   Medication Changes: No orders of the defined types were placed in this encounter.   Disposition:  Follow up in 1 year(s)  Signed, Nanetta BattyJonathan Abbigale Mcelhaney, MD  04/30/2018 1:50 PM    [facility]

## 2018-04-30 NOTE — Telephone Encounter (Signed)
lmtcb to review avs  Letter including After Visit Summary and any other necessary documents to be mailed to the patient's address on file.  

## 2018-04-30 NOTE — Patient Instructions (Signed)
Medication Instructions:  Your physician recommends that you continue on your current medications as directed. Please refer to the Current Medication list given to you today. If you need a refill on your cardiac medications before your next appointment, please call your pharmacy.   Lab work: Your physician recommends that you return for lab work within 1-2 weeks: FASTING LIPID PROFILE AND LIVER FUNCTION TEST.   YOU WILL RECEIVE A LAB SLIP IN THE MAIL. NO APPOINTMENT IS NEEDED. NO FOOD OR DRINK EXCEPT FOR WATER AFTER MIDNIGHT ON THE DAY THAT YOU CHOOSE TO PRESENT FOR LAB WORK. If you have labs (blood work) drawn today and your tests are completely normal, you will receive your results only by: Marland Kitchen MyChart Message (if you have MyChart) OR . A paper copy in the mail If you have any lab test that is abnormal or we need to change your treatment, we will call you to review the results.  Testing/Procedures: NONE  Follow-Up: At [facility], you and your health needs are our priority.  As part of our continuing mission to provide you with exceptional heart care, we have created designated Provider Care Teams.  These Care Teams include your primary Cardiologist (physician) and Advanced Practice Providers (APPs -  Physician Assistants and Nurse Practitioners) who all work together to provide you with the care you need, when you need it. You will need a follow up appointment in 12 months with Dr. Allyson Sabal.  Please call our office 2 months in advance to schedule this appointment.

## 2018-04-30 NOTE — Telephone Encounter (Signed)
Virtual Visit Pre-Appointment Phone Call  "(Name), I am calling you today to discuss your upcoming appointment. We are currently trying to limit exposure to the virus that causes COVID-19 by seeing patients at home rather than in the office."  1. "What is the BEST phone [name] to call the day of the visit?" - include this in appointment notes  2. Do you have or have access to (through a family member/friend) a smartphone with video capability that we can use for your visit?" a. If yes - list this [name] in appt notes as cell (if different from BEST phone #) and list the appointment type as a VIDEO visit in appointment notes b. If no - list the appointment type as a PHONE visit in appointment notes  3. Confirm consent - "In the setting of the current Covid19 crisis, you are scheduled for a (phone or video) visit with your provider on (date) at (time).  Just as we do with many in-office visits, in order for you to participate in this visit, we must obtain consent.  If you'd like, I can send this to your mychart (if signed up) or email for you to review.  Otherwise, I can obtain your verbal consent now.  All virtual visits are billed to your insurance company just like a normal visit would be.  By agreeing to a virtual visit, we'd like you to understand that the technology does not allow for your provider to perform an examination, and thus may limit your provider's ability to fully assess your condition. If your provider identifies any concerns that need to be evaluated in person, we will make arrangements to do so.  Finally, though the technology is pretty good, we cannot assure that it will always work on either your or our end, and in the setting of a video visit, we may have to convert it to a phone-only visit.  In either situation, we cannot ensure that we have a secure connection.  Are you willing to proceed?" STAFF: Did the patient verbally acknowledge consent to telehealth visit? Document  YES/NO here: YES  4. Advise patient to be prepared - "Two hours prior to your appointment, go ahead and check your blood pressure, pulse, oxygen saturation, and your weight (if you have the equipment to check those) and write them all down. When your visit starts, your provider will ask you for this information. If you have an Apple Watch or Kardia device, please plan to have heart rate information ready on the day of your appointment. Please have a pen and paper handy nearby the day of the visit as well."  5. Give patient instructions for MyChart download to smartphone OR Doximity/Doxy.me as below if video visit (depending on what platform provider is using)  6. Inform patient they will receive a phone call 15 minutes prior to their appointment time (may be from unknown caller ID) so they should be prepared to answer    TELEPHONE CALL NOTE  Vincent [name] has been deemed a candidate for a follow-up tele-health visit to limit community exposure during the Covid-19 pandemic. I spoke with the patient via phone to ensure availability of phone/video source, confirm preferred email & phone [name], and discuss instructions and expectations.  I reminded Vincent [name] to be prepared with any vital sign and/or heart rhythm information that could potentially be obtained via home monitoring, at the time of his visit. I reminded Vincent [name] to expect a phone call prior to his visit.  Vincent [name], CMA 04/30/2018 10:41 AM   INSTRUCTIONS FOR DOWNLOADING THE MYCHART APP TO SMARTPHONE  - The patient must first make sure to have activated MyChart and know their login information - If Apple, go to [employer] and type in MyChart in the search bar and download the app. If Android, ask patient to go to [employer] and type in [place] in the search bar and download the app. The app is free but as with any other app downloads, their phone may require them to verify saved payment information or  Apple/Android password.  - The patient will need to then log into the app with their MyChart username and password, and select [place] as their healthcare provider to link the account. When it is time for your visit, go to the MyChart app, find appointments, and click Begin Video Visit. Be sure to Select Allow for your device to access the Microphone and Camera for your visit. You will then be connected, and your provider will be with you shortly.  **If they have any issues connecting, or need assistance please contact MyChart service desk [phone])**  **If using a computer, in order to ensure the best quality for their visit they will need to use either of the following Internet Browsers: [employer] or Google Chrome**  IF USING DOXIMITY or DOXY.ME - The patient will receive a link just prior to their visit by text.     FULL LENGTH CONSENT FOR TELE-HEALTH VISIT   I hereby voluntarily request, consent and authorize [facility] and its employed or contracted physicians, physician assistants, nurse practitioners or other licensed health care professionals (the Practitioner), to provide me with telemedicine health care services (the Services") as deemed necessary by the treating Practitioner. I acknowledge and consent to receive the Services by the Practitioner via telemedicine. I understand that the telemedicine visit will involve communicating with the Practitioner through live audiovisual communication technology and the disclosure of certain medical information by electronic transmission. I acknowledge that I have been given the opportunity to request an in-person assessment or other available alternative prior to the telemedicine visit and am voluntarily participating in the telemedicine visit.  I understand that I have the right to withhold or withdraw my consent to the use of telemedicine in the course of my care at any time, without affecting my right to future care  or treatment, and that the Practitioner or I may terminate the telemedicine visit at any time. I understand that I have the right to inspect all information obtained and/or recorded in the course of the telemedicine visit and may receive copies of available information for a reasonable fee.  I understand that some of the potential risks of receiving the Services via telemedicine include:   Delay or interruption in medical evaluation due to technological equipment failure or disruption;  Information transmitted may not be sufficient (e.g. poor resolution of images) to allow for appropriate medical decision making by the Practitioner; and/or   In rare instances, security protocols could fail, causing a breach of personal health information.  Furthermore, I acknowledge that it is my responsibility to provide information about my medical history, conditions and care that is complete and accurate to the best of my ability. I acknowledge that Practitioner's advice, recommendations, and/or decision may be based on factors not within their control, such as incomplete or inaccurate data provided by me or distortions of diagnostic images or specimens that may result from electronic transmissions. I understand that  the practice of medicine is not an exact science and that Practitioner makes no warranties or guarantees regarding treatment outcomes. I acknowledge that I will receive a copy of this consent concurrently upon execution via email to the email address I last provided but may also request a printed copy by calling the office of [facility].    I understand that my insurance will be billed for this visit.   I have read or had this consent read to me.  I understand the contents of this consent, which adequately explains the benefits and risks of the Services being provided via telemedicine.   I have been provided ample opportunity to ask questions regarding this consent and the Services and have had  my questions answered to my satisfaction.  I give my informed consent for the services to be provided through the use of telemedicine in my medical care  By participating in this telemedicine visit I agree to the above.       Cardiac Questionnaire:    Since your last visit or hospitalization:    1. Have you been having new or worsening chest pain? NO   2. Have you been having new or worsening shortness of breath? NO 3. Have you been having new or worsening leg swelling, wt gain, or increase in abdominal girth (pants fitting more tightly)? NO   4. Have you had any passing out spells? NO    *A YES to any of these questions would result in the appointment being kept. *If all the answers to these questions are NO, we should indicate that given the current situation regarding the worldwide coronarvirus pandemic, at the recommendation of the CDC, we are looking to limit gatherings in our waiting area, and thus will reschedule their appointment beyond four weeks from today.   _____________   COVID-19 Pre-Screening Questions:   Do you currently have a fever? NO  Have you recently travelled on a cruise, internationally, or to [place], [place], [place], [place], [place], or [place] [employer] ? NO  Have you been in contact with someone that is currently pending confirmation of Covid19 testing or has been confirmed to have the Covid19 virus?  NO  Are you currently experiencing fatigue or cough? NO

## 2018-06-19 MED FILL — CLOPIDOGREL 75 MG TABLET: 75 | 90 days supply | Qty: 90 | Fill #0

## 2018-06-19 MED FILL — METOPROLOL TARTRATE 25 MG T: 25 | 90 days supply | Qty: 90 | Fill #0

## 2018-06-19 MED FILL — AMLODIPINE BESYLATE 10 MG T: 10 | 90 days supply | Qty: 90 | Fill #0

## 2018-09-17 ENCOUNTER — Encounter: Payer: Self-pay | Admitting: Family Medicine

## 2018-09-17 ENCOUNTER — Ambulatory Visit (INDEPENDENT_AMBULATORY_CARE_PROVIDER_SITE_OTHER): Payer: Self-pay | Admitting: Family Medicine

## 2018-09-17 ENCOUNTER — Other Ambulatory Visit: Payer: Self-pay

## 2018-09-17 VITALS — BP 141/80 | HR 73 | Ht 71.0 in | Wt 229.4 lb

## 2018-09-17 DIAGNOSIS — Z09 Encounter for follow-up examination after completed treatment for conditions other than malignant neoplasm: Secondary | ICD-10-CM

## 2018-09-17 DIAGNOSIS — Z202 Contact with and (suspected) exposure to infections with a predominantly sexual mode of transmission: Secondary | ICD-10-CM

## 2018-09-17 DIAGNOSIS — I252 Old myocardial infarction: Secondary | ICD-10-CM

## 2018-09-17 DIAGNOSIS — I10 Essential (primary) hypertension: Secondary | ICD-10-CM

## 2018-09-17 DIAGNOSIS — Z2829 Immunization not carried out because of patient decision for other reason: Secondary | ICD-10-CM

## 2018-09-17 HISTORY — DX: Old myocardial infarction: I25.2

## 2018-09-17 HISTORY — DX: Contact with and (suspected) exposure to infections with a predominantly sexual mode of transmission: Z20.2

## 2018-09-17 LAB — POCT URINALYSIS DIPSTICK
Bilirubin, UA: NEGATIVE
Blood, UA: NEGATIVE
Glucose, UA: NEGATIVE
Leukocytes, UA: NEGATIVE
Nitrite, UA: NEGATIVE
Protein, UA: POSITIVE — AB
Spec Grav, UA: >=1.03 — AB (ref 1.010–1.025)
Urobilinogen, UA: 1 E.U./dL
pH, UA: 7 (ref 5.0–8.0)

## 2018-09-17 NOTE — Progress Notes (Signed)
Patient Vincent [name]   Established Patient Office Visit  Subjective:  Patient ID: Vincent [name], male    DOB: 01/28/1955  Age: 64 y.o. MRN: [id number]  CC:  Chief Complaint  Patient presents with  . std testing    HPI Vincent [name] is a 63 year old male who presents for Follow Up today.   Past Medical History:  Diagnosis Date  . Gout   . Hyperlipidemia   . Hypertension   . STEMI (ST elevation myocardial infarction) ([place]) 11/25/2016   small vessel dz at cath, med rx, nl EF   Current Status: Since his last office visit, he is doing well with no complaints. He states that he may have been exposed to STD recently. He wife was just diagnosed with Trichonosis, and needed him to get tested today.He reports frequent urination. He denies urinary frequency, discharge, dysuria, urinary itching, burning, odor, hematuria, and suprapubic pain/discomfort.  He denies any recent Gouty episodes. He denies visual changes, chest pain, cough, shortness of breath, heart palpitations, and falls. He has occasional headaches and dizziness with position changes. Denies severe headaches, confusion, seizures, double vision, and blurred vision, nausea and vomiting.  He denies fevers, chills, fatigue, recent infections, weight loss, and night sweats. He has not had any headaches, visual changes, dizziness, and falls. No chest pain, heart palpitations, cough and shortness of breath reported. No reports of GI problems such as nausea, vomiting, diarrhea, and constipation. He has no reports of blood in stools, dysuria and hematuria. No depression or anxiety reported today. He denies pain today.   Past Surgical History:  Procedure Laterality Date  . LEFT HEART CATH AND CORONARY ANGIOGRAPHY N/A 11/25/2016   Procedure: LEFT HEART CATH AND CORONARY ANGIOGRAPHY;  Surgeon: Lorretta Harp, MD;  Location: [facility];  Service: Cardiovascular;  Laterality: N/A;    Family  History  Problem Relation Age of Onset  . Cancer Mother 110  . CAD Father 70  . CAD Brother 22  . AAA (abdominal aortic aneurysm) Maternal Grandmother 90  . AAA (abdominal aortic aneurysm) Paternal Grandmother 55    Social History   Socioeconomic History  . Marital status: Married    Spouse name: Not on file  . Number of children: Not on file  . Years of education: Not on file  . Highest education level: Not on file  Occupational History  . Occupation: Patient assistance    Employer: [employer]  Social Needs  . Financial resource strain: Not on file  . Food insecurity    Worry: Not on file    Inability: Not on file  . Transportation needs    Medical: Not on file    Non-medical: Not on file  Tobacco Use  . Smoking status: Never Smoker  . Smokeless tobacco: Never Used  Substance and Sexual Activity  . Alcohol use: Yes    Frequency: Never    Comment: occ  . Drug use: No  . Sexual activity: Not on file  Lifestyle  . Physical activity    Days per week: Not on file    Minutes per session: Not on file  . Stress: Not on file  Relationships  . Social Herbalist on phone: Not on file    Gets together: Not on file    Attends religious service: Not on file    Active member of club or organization: Not on file  Attends meetings of clubs or organizations: Not on file    Relationship status: Not on file  . Intimate partner violence    Fear of current or ex partner: Not on file    Emotionally abused: Not on file    Physically abused: Not on file    Forced sexual activity: Not on file  Other Topics Concern  . Not on file  Social History Narrative  . Not on file    Outpatient Medications Prior to Visit  Medication Sig Dispense Refill  . amLODipine (NORVASC) 10 MG tablet Take 1 tablet (10 mg total) by mouth daily. 30 tablet 3  . aspirin 81 MG chewable tablet Chew 1 tablet (81 mg total) by mouth daily. 90 tablet 1  . atorvastatin  (LIPITOR) 80 MG tablet Take 1 tablet (80 mg total) by mouth daily at 6 PM. 90 tablet 1  . clopidogrel (PLAVIX) 75 MG tablet TAKE 1 TABLET BY MOUTH DAILY. 90 tablet 1  . losartan (COZAAR) 50 MG tablet Take 1 tablet (50 mg total) by mouth daily. 90 tablet 3  . metoprolol tartrate (LOPRESSOR) 25 MG tablet Take 0.5 tablets (12.5 mg total) by mouth 2 (two) times daily. 30 tablet 4  . nitroGLYCERIN (NITROSTAT) 0.4 MG SL tablet Place 1 tablet (0.4 mg total) under the tongue every 5 (five) minutes as needed. 25 tablet 2  . sodium chloride (OCEAN) 0.65 % SOLN nasal spray Place 1 spray into both nostrils as needed for congestion. 30 mL 0   No facility-administered medications prior to visit.     No Known Allergies  ROS Review of Systems  Constitutional: Negative.   HENT: Negative.   Eyes: Negative.   Respiratory: Negative.   Cardiovascular: Negative.   Gastrointestinal: Negative.   Endocrine: Negative.   Genitourinary: Negative.        Urinary frequency  Musculoskeletal: Negative.   Skin: Negative.   Allergic/Immunologic: Negative.   Neurological: Positive for dizziness (occasional ) and headaches (occasional ).  Hematological: Negative.   Psychiatric/Behavioral: Negative.       Objective:    Physical Exam  Constitutional: He is oriented to person, place, and time. He appears well-developed and well-nourished.  HENT:  Head: Normocephalic and atraumatic.  Eyes: Conjunctivae are normal.  Neck: Normal range of motion. Neck supple.  Cardiovascular: Normal rate, regular rhythm, normal heart sounds and intact distal pulses.  Pulmonary/Chest: Effort normal.  Abdominal: Soft. Bowel sounds are normal.  Musculoskeletal: Normal range of motion.  Neurological: He is alert and oriented to person, place, and time. He has normal reflexes.  Skin: Skin is warm and dry.  Psychiatric: His behavior is normal. Judgment and thought content normal.  Nursing note and vitals reviewed.   BP (!) 141/80  (BP Location: Left Arm, Patient Position: Sitting, Cuff Size: Large)   Pulse 73   Ht 5\' 11"  (1.803 m)   Wt 229 lb 6.4 oz (104.1 kg)   SpO2 98%   BMI 31.99 kg/m  Wt Readings from Last 3 Encounters:  09/17/18 229 lb 6.4 oz (104.1 kg)  04/30/18 235 lb (106.6 kg)  12/18/17 240 lb (108.9 kg)     Health Maintenance Due  Topic Date Due  . Hepatitis C Screening  Oct 30, 1955  . HIV Screening  05/11/1970  . TETANUS/TDAP  05/11/1974  . COLONOSCOPY  05/10/2005  . INFLUENZA VACCINE  08/09/2018    There are no preventive [name] reminders to display for this patient.  Lab Results  Component Value Date  TSH 1.620 12/14/2016   Lab Results  Component Value Date   WBC 6.0 11/27/2016   HGB 14.4 11/27/2016   HCT 44.2 11/27/2016   MCV 85.5 11/27/2016   PLT 125 (L) 11/27/2016   Lab Results  Component Value Date   NA 144 04/04/2017   K 3.9 04/04/2017   CO2 26 04/04/2017   GLUCOSE 108 (H) 04/04/2017   BUN 10 04/04/2017   CREATININE 0.98 04/04/2017   BILITOT 0.5 04/04/2017   ALKPHOS 70 04/04/2017   AST 37 04/04/2017   ALT 44 04/04/2017   PROT 7.0 04/04/2017   ALBUMIN 4.5 04/04/2017   CALCIUM 9.1 04/04/2017   ANIONGAP 6 11/27/2016   Lab Results  Component Value Date   CHOL 132 04/04/2017   Lab Results  Component Value Date   HDL 47 04/04/2017   Lab Results  Component Value Date   LDLCALC 73 04/04/2017   Lab Results  Component Value Date   TRIG 62 04/04/2017   Lab Results  Component Value Date   CHOLHDL 2.8 04/04/2017   Lab Results  Component Value Date   HGBA1C 6.3 (A) 07/26/2017      Assessment & Plan:   1. Possible exposure to STD - POCT Urinalysis Dipstick - HepB+HepC+HIV Panel - STD Screen (8)  2. Essential hypertension The current medical regimen is effective; blood pressure is stable at 141/80 today; continue present plan and medications as prescribed. He will continue to decrease high sodium intake, excessive alcohol intake, increase potassium intake,  smoking cessation, and increase physical activity of at least 30 minutes of cardio activity daily. He will continue to follow Heart Healthy or DASH diet. - CBC with Differential - Comprehensive metabolic panel - Lipid Panel - TSH - Vitamin B12 - Vitamin D, 25-hydroxy - PSA  3. History of ST elevation myocardial infarction (STEMI) Stable. No signs and symptoms of recurrence noted today.   4. Refusal of influenza vaccine by provider  5. Follow up He will follow up in 6 months.   No orders of the defined types were placed in this encounter.   Orders Placed This Encounter  Procedures  . HepB+HepC+HIV Panel  . STD Screen (8)  . CBC with Differential  . Comprehensive metabolic panel  . Lipid Panel  . TSH  . Vitamin B12  . Vitamin D, 25-hydroxy  . PSA  . POCT Urinalysis Dipstick    Referral Orders  No referral(s) requested today    Raliegh Ip,  MSN, FNP-BC [name] [address] [place], [place] [address] [phone]- fax  Problem List Items Addressed This Visit      Cardiovascular and Mediastinum   Essential hypertension    Other Visit Diagnoses    Possible exposure to STD    -  Primary   Relevant Orders   POCT Urinalysis Dipstick   HepB+HepC+HIV Panel   STD Screen (8)   History of ST elevation myocardial infarction (STEMI)       Refusal of influenza vaccine by provider       Follow up          No orders of the defined types were placed in this encounter.   Follow-up: No follow-ups on file.    Kallie Locks, FNP

## 2018-09-18 LAB — CBC WITH DIFFERENTIAL/PLATELET
Basophils Absolute: 0 10*3/uL (ref 0.0–0.2)
Basos: 1 %
EOS (ABSOLUTE): 0.1 10*3/uL (ref 0.0–0.4)
Eos: 3 %
Hematocrit: 43.5 % (ref 37.5–51.0)
Hemoglobin: 14.6 g/dL (ref 13.0–17.7)
Immature Grans (Abs): 0 10*3/uL (ref 0.0–0.1)
Immature Granulocytes: 0 %
Lymphocytes Absolute: 1.9 10*3/uL (ref 0.7–3.1)
Lymphs: 44 %
MCH: 28 pg (ref 26.6–33.0)
MCHC: 33.6 g/dL (ref 31.5–35.7)
MCV: 83 fL (ref 79–97)
Monocytes Absolute: 0.4 10*3/uL (ref 0.1–0.9)
Monocytes: 9 %
Neutrophils Absolute: 1.9 10*3/uL (ref 1.4–7.0)
Neutrophils: 43 %
Platelets: 172 10*3/uL (ref 150–450)
RBC: 5.22 x10E6/uL (ref 4.14–5.80)
RDW: 14.4 % (ref 11.6–15.4)
WBC: 4.4 10*3/uL (ref 3.4–10.8)

## 2018-09-18 LAB — HEPB+HEPC+HIV PANEL
HIV Screen 4th Generation wRfx: NONREACTIVE
Hep B C IgM: NEGATIVE
Hep B Core Total Ab: NEGATIVE
Hep B E Ab: NEGATIVE
Hep B E Ag: NEGATIVE
Hep B Surface Ab, Qual: NONREACTIVE
Hep C Virus Ab: <0.1 s/co ratio (ref 0.0–0.9)
Hepatitis B Surface Ag: NEGATIVE

## 2018-09-18 LAB — COMPREHENSIVE METABOLIC PANEL
ALT: 16 IU/L (ref 0–44)
AST: 21 IU/L (ref 0–40)
Albumin/Globulin Ratio: 1.9 (ref 1.2–2.2)
Albumin: 4.6 g/dL (ref 3.8–4.8)
Alkaline Phosphatase: 71 IU/L (ref 39–117)
BUN/Creatinine Ratio: 14 (ref 10–24)
BUN: 13 mg/dL (ref 8–27)
Bilirubin Total: 0.3 mg/dL (ref 0.0–1.2)
CO2: 28 mmol/L (ref 20–29)
Calcium: 9.4 mg/dL (ref 8.6–10.2)
Chloride: 103 mmol/L (ref 96–106)
Creatinine, Ser: 0.93 mg/dL (ref 0.76–1.27)
GFR calc Af Amer: 101 mL/min/{1.73_m2} (ref >59)
GFR calc non Af Amer: 87 mL/min/{1.73_m2} (ref >59)
Globulin, Total: 2.4 g/dL (ref 1.5–4.5)
Glucose: 100 mg/dL — ABNORMAL HIGH (ref 65–99)
Potassium: 4.1 mmol/L (ref 3.5–5.2)
Sodium: 145 mmol/L — ABNORMAL HIGH (ref 134–144)
Total Protein: 7 g/dL (ref 6.0–8.5)

## 2018-09-18 LAB — STD SCREEN (8)
HSV 1 Glycoprotein G Ab, IgG: 27.6 index — ABNORMAL HIGH (ref 0.00–0.90)
HSV 2 IgG, Type Spec: <0.91 index (ref 0.00–0.90)
Hep A IgM: NEGATIVE
RPR Ser Ql: NONREACTIVE

## 2018-09-18 LAB — VITAMIN D 25 HYDROXY (VIT D DEFICIENCY, FRACTURES): Vit D, 25-Hydroxy: 21.4 ng/mL — ABNORMAL LOW (ref 30.0–100.0)

## 2018-09-18 LAB — LIPID PANEL
Chol/HDL Ratio: 5 ratio (ref 0.0–5.0)
Cholesterol, Total: 218 mg/dL — ABNORMAL HIGH (ref 100–199)
HDL: 44 mg/dL (ref >39)
LDL Chol Calc (NIH): 140 mg/dL — ABNORMAL HIGH (ref 0–99)
Triglycerides: 191 mg/dL — ABNORMAL HIGH (ref 0–149)
VLDL Cholesterol Cal: 34 mg/dL (ref 5–40)

## 2018-09-18 LAB — TSH: TSH: 1.71 u[IU]/mL (ref 0.450–4.500)

## 2018-09-18 LAB — VITAMIN B12: Vitamin B-12: 517 pg/mL (ref 232–1245)

## 2018-09-18 LAB — PSA: Prostate Specific Ag, Serum: 3.2 ng/mL (ref 0.0–4.0)

## 2018-09-30 ENCOUNTER — Telehealth: Payer: Self-pay | Admitting: Family Medicine

## 2018-09-30 NOTE — Telephone Encounter (Signed)
error 

## 2018-09-30 NOTE — Telephone Encounter (Signed)
Pt had his  Lab done on the 09/17/18 and wants to know the results . Please advise.

## 2018-10-01 ENCOUNTER — Other Ambulatory Visit: Payer: Self-pay

## 2018-10-01 ENCOUNTER — Other Ambulatory Visit: Payer: Self-pay | Admitting: Family Medicine

## 2018-10-01 DIAGNOSIS — Z202 Contact with and (suspected) exposure to infections with a predominantly sexual mode of transmission: Secondary | ICD-10-CM

## 2018-10-05 LAB — GC/CHLAMYDIA PROBE AMP
Chlamydia trachomatis, NAA: NEGATIVE
Neisseria Gonorrhoeae by PCR: NEGATIVE

## 2018-10-05 LAB — TRICHOMONAS VAGINALIS, PROBE AMP: Trich vag by NAA: POSITIVE — AB

## 2018-10-07 ENCOUNTER — Other Ambulatory Visit: Payer: Self-pay | Admitting: Family Medicine

## 2018-10-07 DIAGNOSIS — A599 Trichomoniasis, unspecified: Secondary | ICD-10-CM

## 2018-10-07 MED ORDER — METRONIDAZOLE 500 MG PO TABS: ORAL_TABLET | ORAL | 0 refills | Status: DC

## 2018-10-07 MED FILL — metroNIDAZOLE 500 MG TABS: 500 | 1 days supply | Qty: 4 | Fill #0

## 2018-11-27 MED FILL — AMLODIPINE BESYLATE 10 MG T: 10 | 30 days supply | Qty: 30 | Fill #1

## 2018-11-27 MED FILL — CLOPIDOGREL 75 MG TABLET: 75 | 90 days supply | Qty: 90 | Fill #1

## 2018-12-08 MED FILL — AMLODIPINE BESYLATE 10 MG T: 10 | 90 days supply | Qty: 30 | Fill #2

## 2018-12-14 IMAGING — DX DG CHEST 1V PORT
1 series · 1 of 1 positions shown · non-contrast
Comparison: None.

CLINICAL DATA: Left-sided chest pain

EXAM:
PORTABLE CHEST 1 VIEW

[chest ap]
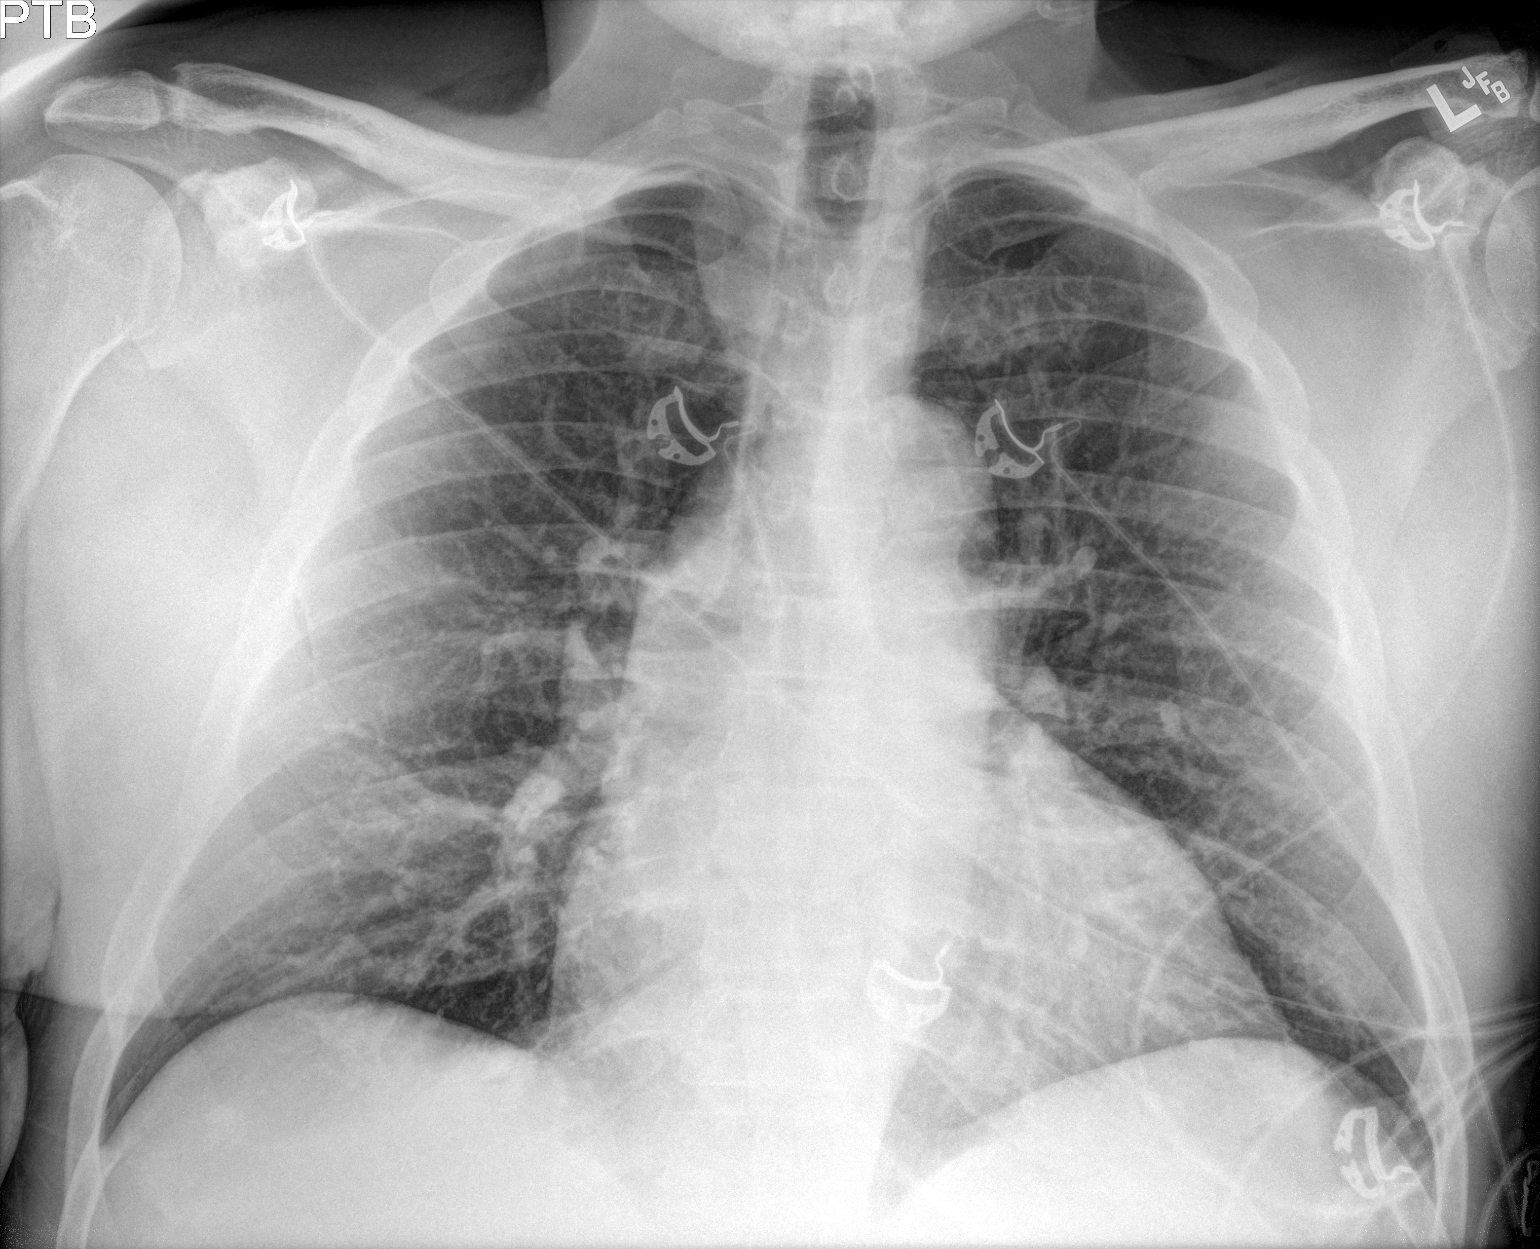

[1 of 1 positions shown; findings below may reference images not displayed]

FINDINGS: The heart size and mediastinal contours are within normal limits.
Both lungs are clear. The visualized skeletal structures are
unremarkable.
IMPRESSION: No active disease.

## 2019-01-15 ENCOUNTER — Other Ambulatory Visit: Payer: Self-pay | Admitting: Cardiovascular Disease

## 2019-01-15 MED FILL — METOPROLOL TARTRATE 25 MG T: 25 | 30 days supply | Qty: 30 | Fill #0

## 2019-01-16 MED FILL — AMLODIPINE BESYLATE 10 MG T: 10 | 30 days supply | Qty: 30 | Fill #0

## 2019-01-23 ENCOUNTER — Other Ambulatory Visit: Payer: Self-pay

## 2019-01-23 ENCOUNTER — Ambulatory Visit (INDEPENDENT_AMBULATORY_CARE_PROVIDER_SITE_OTHER): Payer: BLUE CROSS/BLUE SHIELD | Admitting: Family Medicine

## 2019-01-23 DIAGNOSIS — R0989 Other specified symptoms and signs involving the circulatory and respiratory systems: Secondary | ICD-10-CM | POA: Diagnosis not present

## 2019-01-23 DIAGNOSIS — R05 Cough: Secondary | ICD-10-CM | POA: Diagnosis not present

## 2019-01-23 DIAGNOSIS — B342 Coronavirus infection, unspecified: Secondary | ICD-10-CM

## 2019-01-23 DIAGNOSIS — E782 Mixed hyperlipidemia: Secondary | ICD-10-CM

## 2019-01-23 DIAGNOSIS — Z09 Encounter for follow-up examination after completed treatment for conditions other than malignant neoplasm: Secondary | ICD-10-CM | POA: Diagnosis not present

## 2019-01-23 DIAGNOSIS — I10 Essential (primary) hypertension: Secondary | ICD-10-CM

## 2019-01-23 DIAGNOSIS — R059 Cough, unspecified: Secondary | ICD-10-CM

## 2019-01-23 MED ORDER — LOSARTAN POTASSIUM 50 MG PO TABS: 50.0000 mg | ORAL_TABLET | Freq: Every day | ORAL | 3 refills | Status: DC

## 2019-01-23 MED ORDER — ATORVASTATIN CALCIUM 80 MG PO TABS: 80.0000 mg | ORAL_TABLET | Freq: Every day | ORAL | 3 refills | Status: DC

## 2019-01-23 MED ORDER — AMOXICILLIN-POT CLAVULANATE 875-125 MG PO TABS: 1.0000 | ORAL_TABLET | Freq: Two times a day (BID) | ORAL | 0 refills | Status: AC

## 2019-01-23 MED ORDER — BENZONATATE 100 MG PO CAPS: 100.0000 mg | ORAL_CAPSULE | Freq: Three times a day (TID) | ORAL | 0 refills | Status: DC | PRN

## 2019-01-23 MED FILL — LOSARTAN POTASSIUM 50 MG TA: 50 | 30 days supply | Qty: 30 | Fill #0

## 2019-01-23 MED FILL — BENZONATATE 100 MG CAPS: 100 | 6 days supply | Qty: 20 | Fill #0

## 2019-01-23 MED FILL — AMOX-CLAV 875-125 MG TABLET: 875-125 | 10 days supply | Qty: 20 | Fill #0

## 2019-01-23 MED FILL — ATORVASTATIN 80 MG TABLET: 80 | 30 days supply | Qty: 30 | Fill #0

## 2019-01-23 NOTE — Progress Notes (Signed)
Virtual Visit via Telephone Note  I connected with [place] on 01/23/19 at  8:00 AM EST by telephone and verified that I am speaking with the correct person using two identifiers.   I discussed the limitations, risks, security and privacy concerns of performing an evaluation and management service by telephone and the availability of in person appointments. I also discussed with the patient that there may be a patient responsible charge related to this service. The patient expressed understanding and agreed to proceed.   History of Present Illness:  Past Medical History:  Diagnosis Date  . Gout   . Hyperlipidemia   . Hypertension   . STEMI (ST elevation myocardial infarction) ([place]) 11/25/2016   small vessel dz at cath, med rx, nl EF    Social History   Tobacco Use  . Smoking status: Never Smoker  . Smokeless tobacco: Never Used  Substance Use Topics  . Alcohol use: Yes    Comment: occ  . Drug use: No    Family History  Problem Relation Age of Onset  . Cancer Mother 71  . CAD Father 29  . CAD Brother 45  . AAA (abdominal aortic aneurysm) Maternal Grandmother 90  . AAA (abdominal aortic aneurysm) Paternal Grandmother 90    No Known Allergies Past Surgical History:  Procedure Laterality Date  . LEFT HEART CATH AND CORONARY ANGIOGRAPHY N/A 11/25/2016   Procedure: LEFT HEART CATH AND CORONARY ANGIOGRAPHY;  Surgeon: Lorretta Harp, MD;  Location: [facility];  Service: Cardiovascular;  Laterality: N/A;    Current Outpatient Medications on File Prior to Visit  Medication Sig Dispense Refill  . amLODipine (NORVASC) 10 MG tablet Take 1 tablet (10 mg total) by mouth daily. NEED OV. 30 tablet 0  . aspirin 81 MG chewable tablet Chew 1 tablet (81 mg total) by mouth daily. 90 tablet 1  . clopidogrel (PLAVIX) 75 MG tablet TAKE 1 TABLET BY MOUTH DAILY. 90 tablet 1  . metoprolol tartrate (LOPRESSOR) 25 MG tablet Take 0.5 tablets (12.5 mg total) by mouth 2 (two) times  daily. 30 tablet 4  . nitroGLYCERIN (NITROSTAT) 0.4 MG SL tablet Place 1 tablet (0.4 mg total) under the tongue every 5 (five) minutes as needed. 25 tablet 2  . sodium chloride (OCEAN) 0.65 % SOLN nasal spray Place 1 spray into both nostrils as needed for congestion. 30 mL 0   No current facility-administered medications on file prior to visit.    Current Status: Since his last office visit, he has been tested positive for Coronavirus on 01/14/2019. He continues to have a cough, mild chest congestion, and left rib area pain, which he r/t increased coughing. He also has constipation.  is doing well with no complaints. He denies fevers, chills, fatigue, recent infections, weight loss, and night sweats. He has not had any headaches, visual changes, dizziness, and falls. No chest pain, heart palpitations, and shortness of breath reported. No reports of GI problems such as nausea, vomiting, and diarrhea. He has no reports of blood in stools, dysuria and hematuria. No depression or anxiety reported today. He denies suicidal ideations, homicidal ideations, or auditory hallucinations.   Observations/Objective: Telephone Virtual Visit   Assessment and Plan:  1. Hospital discharge follow-up  2. Coronavirus infection Diagnosed with Coronavirus on 01/14/2019. Patient continues to be quarantined at home, is stable, and is recovering well at home - benzonatate (TESSALON) 100 MG capsule; Take 1 capsule (100 mg total) by mouth every 8 (eight) hours as needed  for cough.  Dispense: 20 capsule; Refill: 0  3. Chest congestion We will initiate antibiotic today.  - amoxicillin-clavulanate (AUGMENTIN) 875-125 MG tablet; Take 1 tablet by mouth 2 (two) times daily for 10 days.  Dispense: 20 tablet; Refill: 0  4. Cough Moderate cough. We will initiate cough suppressant.  - benzonatate (TESSALON) 100 MG capsule; Take 1 capsule (100 mg total) by mouth every 8 (eight) hours as needed for cough.  Dispense: 20 capsule;  Refill: 0  5. Essential hypertension He will continue to take medications as prescribed, to decrease high sodium intake, excessive alcohol intake, increase potassium intake, smoking cessation, and increase physical activity of at least 30 minutes of cardio activity daily. He will continue to follow Heart Healthy or DASH diet. - losartan (COZAAR) 50 MG tablet; Take 1 tablet (50 mg total) by mouth daily.  Dispense: 30 tablet; Refill: 3  6. Mixed hyperlipidemia - atorvastatin (LIPITOR) 80 MG tablet; Take 1 tablet (80 mg total) by mouth daily at 6 PM.  Dispense: 30 tablet; Refill: 3  7. Follow up He will follow up in 4 weeks.   Meds ordered this encounter  Medications  . amoxicillin-clavulanate (AUGMENTIN) 875-125 MG tablet    Sig: Take 1 tablet by mouth 2 (two) times daily for 10 days.    Dispense:  20 tablet    Refill:  0  . benzonatate (TESSALON) 100 MG capsule    Sig: Take 1 capsule (100 mg total) by mouth every 8 (eight) hours as needed for cough.    Dispense:  20 capsule    Refill:  0  . atorvastatin (LIPITOR) 80 MG tablet    Sig: Take 1 tablet (80 mg total) by mouth daily at 6 PM.    Dispense:  30 tablet    Refill:  3  . losartan (COZAAR) 50 MG tablet    Sig: Take 1 tablet (50 mg total) by mouth daily.    Dispense:  30 tablet    Refill:  3    No orders of the defined types were placed in this encounter.   Referral Orders  No referral(s) requested today    Raliegh Ip,  MSN, FNP-BC [facility] [address] [place], [place] [address] [phone] fax     I discussed the assessment and treatment plan with the patient. The patient was provided an opportunity to ask questions and all were answered. The patient agreed with the plan and demonstrated an understanding of the instructions.   The patient was advised to call back or seek an in-person evaluation if the symptoms worsen or if  the condition fails to improve as anticipated.  I provided 20 minutes of non-face-to-face time during this encounter.   Kallie Locks, FNP

## 2019-01-24 DIAGNOSIS — R059 Cough, unspecified: Secondary | ICD-10-CM

## 2019-01-24 DIAGNOSIS — R0989 Other specified symptoms and signs involving the circulatory and respiratory systems: Secondary | ICD-10-CM

## 2019-01-24 DIAGNOSIS — B342 Coronavirus infection, unspecified: Secondary | ICD-10-CM | POA: Insufficient documentation

## 2019-01-24 DIAGNOSIS — R05 Cough: Secondary | ICD-10-CM | POA: Insufficient documentation

## 2019-01-24 HISTORY — DX: Coronavirus infection, unspecified: B34.2

## 2019-01-24 HISTORY — DX: Other specified symptoms and signs involving the circulatory and respiratory systems: R09.89

## 2019-01-24 HISTORY — DX: Cough, unspecified: R05.9

## 2019-02-20 ENCOUNTER — Ambulatory Visit (INDEPENDENT_AMBULATORY_CARE_PROVIDER_SITE_OTHER): Payer: BLUE CROSS/BLUE SHIELD | Admitting: Family Medicine

## 2019-02-20 ENCOUNTER — Other Ambulatory Visit: Payer: Self-pay

## 2019-02-20 ENCOUNTER — Encounter: Payer: Self-pay | Admitting: Family Medicine

## 2019-02-20 VITALS — BP 138/88 | HR 90 | Temp 98.6°F | Ht 71.0 in | Wt 226.4 lb

## 2019-02-20 DIAGNOSIS — Z09 Encounter for follow-up examination after completed treatment for conditions other than malignant neoplasm: Secondary | ICD-10-CM

## 2019-02-20 DIAGNOSIS — Z0000 Encounter for general adult medical examination without abnormal findings: Secondary | ICD-10-CM | POA: Diagnosis not present

## 2019-02-20 DIAGNOSIS — R350 Frequency of micturition: Secondary | ICD-10-CM

## 2019-02-20 DIAGNOSIS — H1032 Unspecified acute conjunctivitis, left eye: Secondary | ICD-10-CM | POA: Diagnosis not present

## 2019-02-20 LAB — POCT URINALYSIS DIPSTICK
Bilirubin, UA: NEGATIVE
Blood, UA: NEGATIVE
Glucose, UA: NEGATIVE
Ketones, UA: NEGATIVE
Leukocytes, UA: NEGATIVE
Nitrite, UA: NEGATIVE
Protein, UA: NEGATIVE
Spec Grav, UA: >=1.03 — AB (ref 1.010–1.025)
Urobilinogen, UA: 0.2 E.U./dL
pH, UA: 5.5 (ref 5.0–8.0)

## 2019-02-20 LAB — POCT GLYCOSYLATED HEMOGLOBIN (HGB A1C): Hemoglobin A1C: 6 % — AB (ref 4.0–5.6)

## 2019-02-20 LAB — GLUCOSE, POCT (MANUAL RESULT ENTRY): POC Glucose: 111 mg/dl — AB (ref 70–99)

## 2019-02-20 MED ORDER — TAMSULOSIN HCL 0.4 MG PO CAPS: 0.4000 mg | ORAL_CAPSULE | Freq: Every day | ORAL | 3 refills | Status: DC

## 2019-02-20 MED ORDER — AZITHROMYCIN 1 % OP SOLN: 1.0000 [drp] | Freq: Every day | OPHTHALMIC | 0 refills | Status: DC

## 2019-02-20 MED FILL — TAMSULOSIN HCL 0.4 MG CAP: 0.4 | 30 days supply | Qty: 30 | Fill #0
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
## 2019-02-20 NOTE — Patient Instructions (Addendum)
Bacterial Conjunctivitis, Adult Bacterial conjunctivitis is an infection of your conjunctiva. This is the clear membrane that covers the white part of your eye and the inner part of your eyelid. This infection can make your eye:  Red or pink.  Itchy. This condition spreads easily from person to person (is contagious) and from one eye to the other eye. What are the causes?  This condition is caused by germs (bacteria). You may get the infection if you come into close contact with: ? A person who has the infection. ? Items that have germs on them (are contaminated), such as face towels, contact lens solution, or eye makeup. What increases the risk? You are more likely to get this condition if you:  Have contact with people who have the infection.  Wear contact lenses.  Have a sinus infection.  Have had a recent eye injury or surgery.  Have a weak body defense system (immune system).  Have dry eyes. What are the signs or symptoms?   Thick, yellowish discharge from the eye.  Tearing or watery eyes.  Itchy eyes.  Burning feeling in your eyes.  Eye redness.  Swollen eyelids.  Blurred vision. How is this treated?   Antibiotic eye drops or ointment.  Antibiotic medicine taken by mouth. This is used for infections that do not get better with drops or ointment or that last more than 10 days.  Cool, wet cloths placed on the eyes.  Artificial tears used 2-6 times a day. Follow these instructions at home: Medicines  Take or apply your antibiotic medicine as told by your doctor. Do not stop taking or applying the antibiotic even if you start to feel better.  Take or apply over-the-counter and prescription medicines only as told by your doctor.  Do not touch your eyelid with the eye-drop bottle or the ointment tube. Managing discomfort  Wipe any fluid from your eye with a warm, wet washcloth or a cotton ball.  Place a clean, cool, wet cloth on your eye. Do this for  10-20 minutes, 3-4 times per day. General instructions  Do not wear contacts until the infection is gone. Wear glasses until your doctor says it is okay to wear contacts again.  Do not wear eye makeup until the infection is gone. Throw away old eye makeup.  Change or wash your pillowcase every day.  Do not share towels or washcloths.  Wash your hands often with soap and water. Use paper towels to dry your hands.  Do not touch or rub your eyes.  Do not drive or use heavy machinery if your vision is blurred. Contact a doctor if:  You have a fever.  You do not get better after 10 days. Get help right away if:  You have a fever and your symptoms get worse all of a sudden.  You have very bad pain when you move your eye.  Your face: ? Hurts. ? Is red. ? Is swollen.  You have sudden loss of vision. Summary  Bacterial conjunctivitis is an infection of your conjunctiva.  This infection spreads easily from person to person.  Wash your hands often with soap and water. Use paper towels to dry your hands.  Take or apply your antibiotic medicine as told by your doctor.  Contact a doctor if you have a fever or you do not get better after 10 days. This information is not intended to replace advice given to you by your health care provider. Make sure you discuss any   questions you have with your health care provider. Document Revised: 04/15/2018 Document Reviewed: 07/31/2017 Elsevier Patient Education  2020 Elsevier Inc. Azithromycin eye solution What is this medicine? AZITHROMYCIN (az ith roe MYE sin) is a macrolide antibiotic. It is used to treat bacterial eye infections. This medicine may be used for other purposes; ask your health care provider or pharmacist if you have questions. COMMON BRAND NAME(S): Azasite What should I tell my health care provider before I take this medicine? They need to know if you have any of these conditions:  wear contact lenses  an unusual or  allergic reaction to azithromycin, erythromycin, other medicines, foods, dyes, or preservatives  pregnant or trying to get pregnant  breast-feeding How should I use this medicine? This medicine is only for use in the eye. Follow the directions on the prescription label. Wash hands before and after use. Try not to touch the tip of the dropper to any surface, including your eye. Tilt your head back slightly and pull your lower eyelid down with your index finger to form a pouch. Squeeze the prescribed number of drops into the pouch. Close the eye gently to spread the drops. Your vision may blur for a few minutes. Use your doses at regular intervals. Do not use your medicine more often than directed. Finish the full course that is prescribed even if you think your condition is better. Do not skip doses or stop your medicine early. Talk to your pediatrician regarding the use of this medicine in children. While this drug may be prescribed for children as young as 55 year old for selected conditions, precautions do apply. Overdosage: If you think you have taken too much of this medicine contact a poison control center or emergency room at once. NOTE: This medicine is only for you. Do not share this medicine with others. What if I miss a dose? If you miss a dose, use it as soon as you can. If it is almost time for your next dose, use only that dose. Do not use double or extra doses. What may interact with this medicine? Interactions are not expected. Do not use any other eye products without telling your prescriber or health care professional. This list may not describe all possible interactions. Give your health care provider a list of all the medicines, herbs, non-prescription drugs, or dietary supplements you use. Also tell them if you smoke, drink alcohol, or use illegal drugs. Some items may interact with your medicine. What should I watch for while using this medicine? Tell your doctor or health care  professional if your symptoms do not get better or if they get worse. Do not wear contact lenses while you have an eye infection. Ask your doctor or health care professional when you can start wearing your contacts again. What side effects may I notice from receiving this medicine? Side effects that you should report to your doctor or health care professional as soon as possible:  allergic reactions like skin rash, itching or hives, swelling of the face, lips, or tongue  changes in vision  eye irritation, pain Side effects that usually do not require medical attention (report to your doctor or health care professional if they continue or are bothersome):  bad taste in mouth  burning, stinging, irritation when drops used  congested nose  dry eyes  teary eyes This list may not describe all possible side effects. Call your doctor for medical advice about side effects. You may report side effects to FDA  at 1-800-FDA-1088. Where should I keep my medicine? Keep out of the reach of children. Store the unopened bottle in the refrigerator between 2 and 8 degrees C (36 and 46 degrees F). Once opened for use, store between 2 and 25 degrees C (36 and 77 degrees F) for up to 14 days. Do not freeze. Throw away any unused medicine after the expiration date or 14 days after opening the bottle, whichever comes first. NOTE: This sheet is a summary. It may not cover all possible information. If you have questions about this medicine, talk to your doctor, pharmacist, or health care provider.  2020 Elsevier/Gold Standard (2007-03-25 14:57:20)

## 2019-02-23 ENCOUNTER — Other Ambulatory Visit: Payer: Self-pay | Admitting: Family Medicine

## 2019-02-23 DIAGNOSIS — H1032 Unspecified acute conjunctivitis, left eye: Secondary | ICD-10-CM

## 2019-02-23 DIAGNOSIS — R350 Frequency of micturition: Secondary | ICD-10-CM

## 2019-02-23 HISTORY — DX: Unspecified acute conjunctivitis, left eye: H10.32

## 2019-02-23 HISTORY — DX: Frequency of micturition: R35.0

## 2019-02-23 MED ORDER — OFLOXACIN 0.3 % OP SOLN: 1.0000 [drp] | Freq: Four times a day (QID) | OPHTHALMIC | 0 refills | Status: DC

## 2019-02-23 MED FILL — OFLOXACIN 0.3% EYE DROPS: 0.3 | 25 days supply | Qty: 5 | Fill #0

## 2019-02-23 MED FILL — HYDROCODON-APAP 5-325: 5-325 | 3 days supply | Qty: 10 | Fill #0

## 2019-02-23 MED FILL — BACLOFEN 10 MG TABS: 10 | 10 days supply | Qty: 30 | Fill #0

## 2019-03-03 ENCOUNTER — Encounter (HOSPITAL_BASED_OUTPATIENT_CLINIC_OR_DEPARTMENT_OTHER): Payer: Self-pay | Admitting: Emergency Medicine

## 2019-03-03 ENCOUNTER — Telehealth: Payer: Self-pay

## 2019-03-03 ENCOUNTER — Emergency Department (HOSPITAL_BASED_OUTPATIENT_CLINIC_OR_DEPARTMENT_OTHER)
Admission: EM | Admit: 2019-03-03 | Discharge: 2019-03-03 | Disposition: A | Discharge status: Home / Self Care | Payer: BLUE CROSS/BLUE SHIELD | Attending: Emergency Medicine | Admitting: Emergency Medicine

## 2019-03-03 ENCOUNTER — Other Ambulatory Visit: Payer: Self-pay

## 2019-03-03 DIAGNOSIS — Z79899 Other long term (current) drug therapy: Secondary | ICD-10-CM | POA: Diagnosis not present

## 2019-03-03 DIAGNOSIS — I2510 Atherosclerotic heart disease of native coronary artery without angina pectoris: Secondary | ICD-10-CM | POA: Insufficient documentation

## 2019-03-03 DIAGNOSIS — E785 Hyperlipidemia, unspecified: Secondary | ICD-10-CM | POA: Insufficient documentation

## 2019-03-03 DIAGNOSIS — Z9861 Coronary angioplasty status: Secondary | ICD-10-CM | POA: Insufficient documentation

## 2019-03-03 DIAGNOSIS — R42 Dizziness and giddiness: Secondary | ICD-10-CM | POA: Diagnosis present

## 2019-03-03 DIAGNOSIS — I252 Old myocardial infarction: Secondary | ICD-10-CM | POA: Insufficient documentation

## 2019-03-03 DIAGNOSIS — I10 Essential (primary) hypertension: Secondary | ICD-10-CM | POA: Diagnosis not present

## 2019-03-03 DIAGNOSIS — Z7982 Long term (current) use of aspirin: Secondary | ICD-10-CM | POA: Insufficient documentation

## 2019-03-03 LAB — CBC WITH DIFFERENTIAL/PLATELET
Abs Immature Granulocytes: 0.02 10*3/uL (ref 0.00–0.07)
Basophils Absolute: 0 10*3/uL (ref 0.0–0.1)
Basophils Relative: 0 %
Eosinophils Absolute: 0.1 10*3/uL (ref 0.0–0.5)
Eosinophils Relative: 1 %
HCT: 43.3 % (ref 39.0–52.0)
Hemoglobin: 14 g/dL (ref 13.0–17.0)
Immature Granulocytes: 0 %
Lymphocytes Relative: 22 %
Lymphs Abs: 1.6 10*3/uL (ref 0.7–4.0)
MCH: 27.3 pg (ref 26.0–34.0)
MCHC: 32.3 g/dL (ref 30.0–36.0)
MCV: 84.4 fL (ref 80.0–100.0)
Monocytes Absolute: 0.5 10*3/uL (ref 0.1–1.0)
Monocytes Relative: 7 %
Neutro Abs: 5.1 10*3/uL (ref 1.7–7.7)
Neutrophils Relative %: 70 %
Platelets: 201 10*3/uL (ref 150–400)
RBC: 5.13 MIL/uL (ref 4.22–5.81)
RDW: 14.5 % (ref 11.5–15.5)
WBC: 7.3 10*3/uL (ref 4.0–10.5)
nRBC: 0 % (ref 0.0–0.2)

## 2019-03-03 LAB — URINALYSIS, ROUTINE W REFLEX MICROSCOPIC
Bilirubin Urine: NEGATIVE
Glucose, UA: NEGATIVE mg/dL
Hgb urine dipstick: NEGATIVE
Ketones, ur: NEGATIVE mg/dL
Leukocytes,Ua: NEGATIVE
Nitrite: NEGATIVE
Protein, ur: NEGATIVE mg/dL
Specific Gravity, Urine: 1.02 (ref 1.005–1.030)
pH: 7 (ref 5.0–8.0)

## 2019-03-03 LAB — COMPREHENSIVE METABOLIC PANEL
ALT: 18 U/L (ref 0–44)
AST: 20 U/L (ref 15–41)
Albumin: 3.8 g/dL (ref 3.5–5.0)
Alkaline Phosphatase: 58 U/L (ref 38–126)
Anion gap: 7 (ref 5–15)
BUN: 12 mg/dL (ref 8–23)
CO2: 29 mmol/L (ref 22–32)
Calcium: 9.1 mg/dL (ref 8.9–10.3)
Chloride: 104 mmol/L (ref 98–111)
Creatinine, Ser: 0.84 mg/dL (ref 0.61–1.24)
GFR calc Af Amer: >60 mL/min (ref >60)
GFR calc non Af Amer: >60 mL/min (ref >60)
Glucose, Bld: 127 mg/dL — ABNORMAL HIGH (ref 70–99)
Potassium: 3.8 mmol/L (ref 3.5–5.1)
Sodium: 140 mmol/L (ref 135–145)
Total Bilirubin: 0.4 mg/dL (ref 0.3–1.2)
Total Protein: 7.2 g/dL (ref 6.5–8.1)

## 2019-03-03 MED ORDER — ONDANSETRON 4 MG PO TBDP: 4.0000 mg | ORAL_TABLET | Freq: Three times a day (TID) | ORAL | 0 refills | Status: DC | PRN

## 2019-03-03 MED ORDER — ONDANSETRON HCL 4 MG/2ML IJ SOLN
4.0000 mg | Freq: Once | INTRAMUSCULAR | Status: AC
  Administered 2019-03-03: 4 mg via INTRAVENOUS

## 2019-03-03 MED ORDER — ONDANSETRON HCL 4 MG/2ML IJ SOLN
INTRAMUSCULAR | Status: AC
  Filled 2019-03-03: qty 2

## 2019-03-03 MED ORDER — SODIUM CHLORIDE 0.9 % IV BOLUS
500.0000 mL | Freq: Once | INTRAVENOUS | Status: AC
  Administered 2019-03-03: 500 mL via INTRAVENOUS

## 2019-03-03 MED FILL — ONDANSETRON ODT 4 MG TABLET: 4 | 2 days supply | Qty: 5 | Fill #0

## 2019-03-03 NOTE — Telephone Encounter (Signed)
done

## 2019-03-03 NOTE — Discharge Instructions (Addendum)
You were seen in the emergency department today for dizziness, lightheadedness, and nausea.  Your labs were all reassuring.  Your EKG looks similar to prior EKG as you have had done.  We are concerned that your symptoms may be related to a medication side effect, please call and discuss with your primary care provider, potentially try not taking your Flomax for a couple of days after discussion with them.  Please be sure to stay well-hydrated.  Follow-up with your primary care provider within 3 days.  Return to the emergency department for new or worsening symptoms including but not limited to worsening dizziness, inability to keep fluids down, numbness, weakness, trouble walking, chest pain, trouble breathing, or any other concerns.

## 2019-03-03 NOTE — ED Triage Notes (Signed)
Dizziness for past 2-3 weeks with position change.

## 2019-03-03 NOTE — ED Notes (Signed)
ED Provider at bedside. 

## 2019-03-03 NOTE — Telephone Encounter (Signed)
Patient c/o on set today of dizziness, nausea, hot & cold chills. So sick he had to come home from work today. Per [place] you have no appointments today. Caregiver advised to take him to an Urgent Care for evaluation today.   Please advise.

## 2019-03-03 NOTE — ED Provider Notes (Signed)
[facility] EMERGENCY DEPARTMENT Provider Note   CSN: [id number] Arrival date & time: 03/03/19  2993     History Chief Complaint  Patient presents with  . Dizziness    Vincent [name] is a 64 y.o. male with a hx of hypertension, hyperlipidemia, & CAD who presents to the ED with complaints of intermittent dizziness x 2-3 weeks which seemed worse this AM. Patient describes his dizziness more as lightheadedness as if he may pass out, does not have sensation of room spinning of disequilibrium. States this is typically when he wakes up with subsequent sitting/standing in the AM, improves throughout the day. No change with turning of the head. Today this seemed worse and he became nauseated with self induced emesis which prompted ED visit. He states he still feels somewhat dizzy when he stands up and remains somewhat nauseated. Denies fever, chills, URI sxs, visual disturbance, numbness, weakness, headache, syncope, abdominal pain, chest pain, or dyspnea. Reports recent med change with addition of Flomax by PCP about 2 weeks ago- states he was having some mild dizziness prior to this, but seemed worse since then. Has a history of vertigo but that was more triggered with head movements unlike this.   HPI     Past Medical History:  Diagnosis Date  . Gout   . Hyperlipidemia   . Hypertension   . STEMI (ST elevation myocardial infarction) (HCC) 11/25/2016   small vessel dz at cath, med rx, nl EF    Patient Active Problem List   Diagnosis Date Noted  . Acute bacterial conjunctivitis of left eye 02/23/2019  . Urinary frequency 02/23/2019  . Coronavirus infection 01/24/2019  . Chest congestion 01/24/2019  . Cough 01/24/2019  . Possible exposure to STD 09/17/2018  . History of ST elevation myocardial infarction (STEMI) 09/17/2018  . 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 ST elevation myocardial infarction (STEMI) (HCC) 11/25/2016  . Essential  hypertension   . Hyperlipidemia   . Hypokalemia     Past Surgical History:  Procedure Laterality Date  . LEFT HEART CATH AND CORONARY ANGIOGRAPHY N/A 11/25/2016   Procedure: LEFT HEART CATH AND CORONARY ANGIOGRAPHY;  Surgeon: Runell Gess, MD;  Location: [facility];  Service: Cardiovascular;  Laterality: N/A;       Family History  Problem Relation Age of Onset  . Cancer Mother 45  . CAD Father 64  . CAD Brother 72  . AAA (abdominal aortic aneurysm) Maternal Grandmother 90  . AAA (abdominal aortic aneurysm) Paternal Grandmother 70    Social History   Tobacco Use  . Smoking status: Never Smoker  . Smokeless tobacco: Never Used  Substance Use Topics  . Alcohol use: Yes    Comment: occ  . Drug use: No    Home Medications Prior to Admission medications   Medication Sig Start Date End Date Taking? Authorizing Provider  amLODipine (NORVASC) 10 MG tablet Take 1 tablet (10 mg total) by mouth daily. NEED OV. 01/16/19  Yes Runell Gess, MD  aspirin 81 MG chewable tablet Chew 1 tablet (81 mg total) by mouth daily. 01/25/17  Yes Bing Neighbors, FNP  azithromycin (AZASITE) 1 % ophthalmic solution Place 1 drop into the left eye daily. 02/20/19  Yes Kallie Locks, FNP  clopidogrel (PLAVIX) 75 MG tablet TAKE 1 TABLET BY MOUTH DAILY. 04/02/18  Yes Runell Gess, MD  losartan (COZAAR) 50 MG tablet Take 1 tablet (50 mg total) by mouth daily.  01/23/19  Yes Azzie Glatter, FNP  metoprolol tartrate (LOPRESSOR) 25 MG tablet Take 0.5 tablets (12.5 mg total) by mouth 2 (two) times daily. 04/30/18  Yes Lorretta Harp, MD  nitroGLYCERIN (NITROSTAT) 0.4 MG SL tablet Place 1 tablet (0.4 mg total) under the tongue every 5 (five) minutes as needed. 12/14/16  Yes Scot Jun, FNP  ofloxacin (OCUFLOX) 0.3 % ophthalmic solution Place 1 drop into the left eye 4 (four) times daily. 02/23/19  Yes Azzie Glatter, FNP  tamsulosin (FLOMAX) 0.4 MG CAPS capsule Take 1 capsule  (0.4 mg total) by mouth daily. 02/20/19  Yes Azzie Glatter, FNP  atorvastatin (LIPITOR) 80 MG tablet Take 1 tablet (80 mg total) by mouth daily at 6 PM. 01/23/19   Azzie Glatter, FNP  benzonatate (TESSALON) 100 MG capsule Take 1 capsule (100 mg total) by mouth every 8 (eight) hours as needed for cough. 01/23/19   Azzie Glatter, FNP  sodium chloride (OCEAN) 0.65 % SOLN nasal spray Place 1 spray into both nostrils as needed for congestion. 03/27/17   Scot Jun, FNP    Allergies    Patient has no known allergies.  Review of Systems   Review of Systems  Constitutional: Negative for chills and fever.  HENT: Negative for congestion, ear pain and sore throat.   Eyes: Negative for visual disturbance.  Respiratory: Negative for cough and shortness of breath.   Cardiovascular: Negative for chest pain.  Gastrointestinal: Positive for nausea and vomiting (x1). Negative for abdominal pain, blood in stool, constipation and diarrhea.  Neurological: Positive for dizziness and light-headedness. Negative for seizures, syncope, facial asymmetry, speech difficulty, weakness, numbness and headaches.  All other systems reviewed and are negative.   Physical Exam Updated Vital Signs BP (!) 160/87 (BP Location: Right Arm)   Pulse 73   Temp 98.1 F (36.7 C) (Oral)   Resp 18   Ht 6' (1.829 m)   Wt 101.8 kg   SpO2 96%   BMI 30.43 kg/m   Physical Exam Vitals and nursing note reviewed.  Constitutional:      General: He is not in acute distress.    Appearance: He is well-developed. He is not toxic-appearing.  HENT:     Head: Normocephalic and atraumatic.     Right Ear: Ear canal normal. Tympanic membrane is not perforated, erythematous, retracted or bulging.     Left Ear: Ear canal normal. Tympanic membrane is not perforated, erythematous, retracted or bulging.     Ears:     Comments: No mastoid erythema/swellng/tenderness.     Nose:     Right Sinus: No maxillary sinus tenderness or  frontal sinus tenderness.     Left Sinus: No maxillary sinus tenderness or frontal sinus tenderness.     Mouth/Throat:     Pharynx: Oropharynx is clear. Uvula midline. No oropharyngeal exudate or posterior oropharyngeal erythema.     Comments: Posterior oropharynx is symmetric appearing. Patient tolerating own secretions without difficulty. No trismus. No drooling. No hot potato voice. No swelling beneath the tongue, submandibular compartment is soft.  Eyes:     General:        Right eye: No discharge.        Left eye: No discharge.     Extraocular Movements: Extraocular movements intact.     Conjunctiva/sclera: Conjunctivae normal.     Pupils: Pupils are equal, round, and reactive to light.     Comments: Horizontal nystagmus noted with EOM. No rotational/vertical nystagmus.  Cardiovascular:     Rate and Rhythm: Normal rate and regular rhythm.  Pulmonary:     Effort: Pulmonary effort is normal. No respiratory distress.     Breath sounds: Normal breath sounds. No wheezing, rhonchi or rales.  Abdominal:     General: There is no distension.     Palpations: Abdomen is soft.     Tenderness: There is no abdominal tenderness.  Musculoskeletal:     Cervical back: Neck supple. No rigidity.  Lymphadenopathy:     Cervical: No cervical adenopathy.  Skin:    General: Skin is warm and dry.     Findings: No rash.  Neurological:     Mental Status: He is alert.     Comments: Alert. Clear speech. No facial droop. CNIII-XII grossly intact. Bilateral upper and lower extremities' sensation grossly intact. 5/5 symmetric strength with grip strength and with plantar and dorsi flexion bilaterally. Normal finger to nose bilaterally. Negative pronator drift. Negative Romberg sign. Gait is steady and intact.   Psychiatric:        Behavior: Behavior normal.    ED Results / Procedures / Treatments   Labs (all labs ordered are listed, but only abnormal results are displayed) Labs Reviewed  COMPREHENSIVE  METABOLIC PANEL - Abnormal; Notable for the following components:      Result Value   Glucose, Bld 127 (*)    All other components within normal limits  CBC WITH DIFFERENTIAL/PLATELET  URINALYSIS, ROUTINE W REFLEX MICROSCOPIC    EKG EKG Interpretation  Date/Time:  Tuesday March 03 2019 09:03:36 EST Ventricular Rate:  67 PR Interval:    QRS Duration: 90 QT Interval:  387 QTC Calculation: 409 R Axis:   7 Text Interpretation: Sinus rhythm Nonspecific ST abnormality No significant change since last tracing Confirmed by Cathren Laine ([id number]) on 03/03/2019 9:14:41 AM   Radiology No results found.  Procedures Procedures (including critical care time)  09:20: Cardiac monitoring reveals normal sinus rhythm @ a rate of 79 (Rate & rhythm), as reviewed and interpreted by me. Cardiac monitoring was ordered due to lightheadedness/dizziness and to monitor patient for dysrhythmia.  Medications Ordered in ED Medications  ondansetron (ZOFRAN) 4 MG/2ML injection (has no administration in time range)  sodium chloride 0.9 % bolus 500 mL (500 mLs Intravenous New Bag/Given 03/03/19 0935)  ondansetron (ZOFRAN) injection 4 mg (4 mg Intravenous Given 03/03/19 0935)    ED Course  I have reviewed the triage vital signs and the nursing notes.  Pertinent labs & imaging results that were available during my care of the patient were reviewed by me and considered in my medical decision making (see chart for details).    MDM Rules/Calculators/A&P                      Patient presents to the ED with intermittent dizziness x 2-3 weeks, worse this AM with associated nausea. Nontoxic, vitals WNL with the exception of elevated BP. Orthostatic performed by NT:   Orthostatic VS for the past 24 hrs:  BP- Lying Pulse- Lying BP- Sitting Pulse- Sitting BP- Standing at 0 minutes Pulse- Standing at 0 minutes  03/03/19 0915 152/90 67 (!) 145/101 82 160/87 89   No significant drop in BP noted.  On exam he has no  focal neuro deficits, dizziness is not like room spinning, does not seem like acute CVA. Given not room spinning sensation and does not change with head movements feel vertigo is somewhat less likely as well, mild  horizontal nystagmus with EOM, no rotation/vertical and remains without focal neuro deficits. EKG with no significant change from prior tracing- I have personally reviewed & agree with Dr. Norman Herrlich interpretation. Cardiac monitor reviewed & reassuring. Reviewed nursing notes & prior charts for additional history. Labs reviewed & compared to prior blood work on chart review- no acute abnormality, mild hyperglycemia, no significant electrolyte derangement or anemia. Patient feeling improved s/p fluids & zofran. Unclear definitive etiology, question med side effect with starting of flomax with worsening symptoms, also on anti-hypertensives, discussed holding flomax for a couple days to see if this helps, will have patient discuss w/ PCP regarding chronic med management. Prescribe short course of zofran PRN. I discussed results, treatment plan, need for follow-up, and return precautions with the patient. Provided opportunity for questions, patient confirmed understanding and is in agreement with plan.    Findings and plan of care discussed with supervising physician Dr. Denton Lank who is in agreement.   Final Clinical Impression(s) / ED Diagnoses Final diagnoses:  Lightheadedness    Rx / DC Orders ED Discharge Orders    None       Cherly Anderson, PA-C 03/03/19 1058    Cathren Laine, MD 03/03/19 1209

## 2019-03-03 NOTE — Telephone Encounter (Signed)
Patient has been advised. He is already at the Urgent Care.

## 2019-03-03 NOTE — ED Notes (Signed)
Given gingerale for fluid challenge  

## 2019-03-16 ENCOUNTER — Telehealth: Payer: Self-pay | Admitting: Family Medicine

## 2019-03-16 NOTE — Telephone Encounter (Signed)
Pt was called and reminded of there appointment 

## 2019-03-17 ENCOUNTER — Encounter: Payer: Self-pay | Admitting: Family Medicine

## 2019-03-17 ENCOUNTER — Other Ambulatory Visit: Payer: Self-pay

## 2019-03-17 ENCOUNTER — Ambulatory Visit (INDEPENDENT_AMBULATORY_CARE_PROVIDER_SITE_OTHER): Payer: BLUE CROSS/BLUE SHIELD | Admitting: Family Medicine

## 2019-03-17 VITALS — BP 145/86 | HR 72 | Temp 98.2°F | Ht 72.0 in | Wt 224.0 lb

## 2019-03-17 DIAGNOSIS — Z09 Encounter for follow-up examination after completed treatment for conditions other than malignant neoplasm: Secondary | ICD-10-CM | POA: Diagnosis not present

## 2019-03-17 DIAGNOSIS — I10 Essential (primary) hypertension: Secondary | ICD-10-CM

## 2019-03-17 DIAGNOSIS — I252 Old myocardial infarction: Secondary | ICD-10-CM | POA: Diagnosis not present

## 2019-03-17 DIAGNOSIS — E782 Mixed hyperlipidemia: Secondary | ICD-10-CM

## 2019-03-17 LAB — POCT URINALYSIS DIPSTICK
Bilirubin, UA: NEGATIVE
Blood, UA: NEGATIVE
Glucose, UA: NEGATIVE
Ketones, UA: NEGATIVE
Leukocytes, UA: NEGATIVE
Nitrite, UA: NEGATIVE
Protein, UA: POSITIVE — AB
Spec Grav, UA: 1.025 (ref 1.010–1.025)
Urobilinogen, UA: 1 E.U./dL
pH, UA: 7 (ref 5.0–8.0)

## 2019-03-17 NOTE — Progress Notes (Signed)
Patient [facility]   Established Patient Office Visit  Subjective:  Patient ID: Vincent [name], male    DOB: 1955-04-27  Age: 64 y.o. MRN: [id number]  CC:  Chief Complaint  Patient presents with   Follow-up    HPI Vincent [name] is a 64 year old male who presents for Follow Up today.   Past Medical History:  Diagnosis Date   Gout    Hyperlipidemia    Hypertension    STEMI (ST elevation myocardial infarction) ([facility]) 11/25/2016   small vessel dz at cath, med rx, nl EF    Current Status: Since his last office visit, he is doing well with no complaints. He denies visual changes, chest pain, cough, shortness of breath, heart palpitations, and falls. He has occasional headaches and dizziness with position changes. Denies severe headaches, confusion, seizures, double vision, and blurred vision, nausea and vomiting. He denies fevers, chills, fatigue, recent infections, weight loss, and night sweats. No reports of GI problems such as diarrhea, and constipation. He has no reports of blood in stools, dysuria and hematuria. No depression or anxiety reported today. He denies suicidal ideations, homicidal ideations, or auditory hallucinations. He denies pain today.   Past Surgical History:  Procedure Laterality Date   LEFT HEART CATH AND CORONARY ANGIOGRAPHY N/A 11/25/2016   Procedure: LEFT HEART CATH AND CORONARY ANGIOGRAPHY;  Surgeon: Lorretta Harp, MD;  Location: [facility];  Service: Cardiovascular;  Laterality: N/A;    Family History  Problem Relation Age of Onset   Cancer Mother 43   CAD Father 43   CAD Brother 75   AAA (abdominal aortic aneurysm) Maternal Grandmother 90   AAA (abdominal aortic aneurysm) Paternal Grandmother 90    Social History   Socioeconomic History   Marital status: Married    Spouse name: Not on file   Number of children: Not on file   Years of education: Not on file   Highest  education level: Not on file  Occupational History   Occupation: Patient assistance    Employer: [employer]  Tobacco Use   Smoking status: Never Smoker   Smokeless tobacco: Never Used  Substance and Sexual Activity   Alcohol use: Yes    Comment: occ   Drug use: No   Sexual activity: Yes  Other Topics Concern   Not on file  Social History Narrative   Not on file   Social Determinants of Health   Financial Resource Strain:    Difficulty of Paying Living Expenses:   Food Insecurity:    Worried About Charity fundraiser in the Last Year:    Arboriculturist in the Last Year:   Transportation Needs:    Film/video editor (Medical):    Lack of Transportation (Non-Medical):   Physical Activity:    Days of Exercise per Week:    Minutes of Exercise per Session:   Stress:    Feeling of Stress :   Social Connections:    Frequency of Communication with Friends and Family:    Frequency of Social Gatherings with Friends and Family:    Attends Religious Services:    Active Member of Clubs or Organizations:    Attends Archivist Meetings:    Marital Status:   Intimate Partner Violence:    Fear of Current or Ex-Partner:    Emotionally Abused:    Physically Abused:    Sexually Abused:  Outpatient Medications Prior to Visit  Medication Sig Dispense Refill   amLODipine (NORVASC) 10 MG tablet Take 1 tablet (10 mg total) by mouth daily. NEED OV. 30 tablet 0   aspirin 81 MG chewable tablet Chew 1 tablet (81 mg total) by mouth daily. 90 tablet 1   atorvastatin (LIPITOR) 80 MG tablet Take 1 tablet (80 mg total) by mouth daily at 6 PM. 30 tablet 3   clopidogrel (PLAVIX) 75 MG tablet TAKE 1 TABLET BY MOUTH DAILY. 90 tablet 1   losartan (COZAAR) 50 MG tablet Take 1 tablet (50 mg total) by mouth daily. 30 tablet 3   metoprolol tartrate (LOPRESSOR) 25 MG tablet Take 0.5 tablets (12.5 mg total) by mouth 2 (two) times  daily. 30 tablet 4   nitroGLYCERIN (NITROSTAT) 0.4 MG SL tablet Place 1 tablet (0.4 mg total) under the tongue every 5 (five) minutes as needed. 25 tablet 2   tamsulosin (FLOMAX) 0.4 MG CAPS capsule Take 1 capsule (0.4 mg total) by mouth daily. 30 capsule 3   amoxicillin-clavulanate (AUGMENTIN) 875-125 MG tablet      atorvastatin (LIPITOR) 80 MG tablet Take by mouth.     azithromycin (AZASITE) 1 % ophthalmic solution Apply to eye.     clopidogrel (PLAVIX) 75 MG tablet TAKE 1 TABLET BY MOUTH DAILY.     losartan (COZAAR) 50 MG tablet Take by mouth.     metoprolol tartrate (LOPRESSOR) 25 MG tablet Take by mouth.     tamsulosin (FLOMAX) 0.4 MG CAPS capsule Take by mouth.     azithromycin (AZASITE) 1 % ophthalmic solution Place 1 drop into the left eye daily. 2.5 mL 0   baclofen (LIORESAL) 10 MG tablet Take 10 mg by mouth 3 (three) times daily.     benzonatate (TESSALON) 100 MG capsule Take 1 capsule (100 mg total) by mouth every 8 (eight) hours as needed for cough. 20 capsule 0   ofloxacin (OCUFLOX) 0.3 % ophthalmic solution Place 1 drop into the left eye 4 (four) times daily. 5 mL 0   ondansetron (ZOFRAN ODT) 4 MG disintegrating tablet Take 1 tablet (4 mg total) by mouth every 8 (eight) hours as needed for nausea or vomiting. 5 tablet 0   sodium chloride (OCEAN) 0.65 % SOLN nasal spray Place 1 spray into both nostrils as needed for congestion. 30 mL 0   No facility-administered medications prior to visit.    No Known Allergies  ROS Review of Systems  Constitutional: Negative.   HENT: Negative.   Eyes: Negative.   Respiratory: Negative.   Cardiovascular: Negative.   Gastrointestinal: Negative.   Endocrine: Negative.   Genitourinary: Negative.   Musculoskeletal: Negative.   Skin: Negative.   Allergic/Immunologic: Negative.   Neurological: Positive for dizziness (occasional ) and headaches (occasional ).  Hematological: Negative.   Psychiatric/Behavioral: Negative.        Objective:    Physical Exam  Constitutional: He is oriented to person, place, and time. He appears well-developed and well-nourished.  HENT:  Head: Normocephalic and atraumatic.  Eyes: Conjunctivae are normal.  Cardiovascular: Normal rate, regular rhythm, normal heart sounds and intact distal pulses.  Pulmonary/Chest: Effort normal and breath sounds normal.  Abdominal: Soft. Bowel sounds are normal.  Musculoskeletal:        General: Normal range of motion.     Cervical back: Normal range of motion and neck supple.  Neurological: He is alert and oriented to person, place, and time. He has normal reflexes.  Skin: Skin is warm  and dry.  Psychiatric: He has a normal mood and affect. His behavior is normal. Judgment and thought content normal.  Nursing note and vitals reviewed.   BP (!) 145/86    Pulse 72    Temp 98.2 F (36.8 C)    Ht 6' (1.829 m)    Wt 224 lb (101.6 kg)    SpO2 99%    BMI 30.38 kg/m  Wt Readings from Last 3 Encounters:  03/17/19 224 lb (101.6 kg)  03/03/19 224 lb 6.4 oz (101.8 kg)  02/20/19 226 lb 6.4 oz (102.7 kg)     Health Maintenance Due  Topic Date Due   TETANUS/TDAP  Never done   COLONOSCOPY  Never done   INFLUENZA VACCINE  Never done    There are no preventive care reminders to display for this patient.  Lab Results  Component Value Date   TSH 1.710 09/17/2018   Lab Results  Component Value Date   WBC 7.3 03/03/2019   HGB 14.0 03/03/2019   HCT 43.3 03/03/2019   MCV 84.4 03/03/2019   PLT 201 03/03/2019   Lab Results  Component Value Date   NA 140 03/03/2019   K 3.8 03/03/2019   CO2 29 03/03/2019   GLUCOSE 127 (H) 03/03/2019   BUN 12 03/03/2019   CREATININE 0.84 03/03/2019   BILITOT 0.4 03/03/2019   ALKPHOS 58 03/03/2019   AST 20 03/03/2019   ALT 18 03/03/2019   PROT 7.2 03/03/2019   ALBUMIN 3.8 03/03/2019   CALCIUM 9.1 03/03/2019   ANIONGAP 7 03/03/2019   Lab Results  Component Value Date   CHOL 218 (H) 09/17/2018    Lab Results  Component Value Date   HDL 44 09/17/2018   Lab Results  Component Value Date   LDLCALC 140 (H) 09/17/2018   Lab Results  Component Value Date   TRIG 191 (H) 09/17/2018   Lab Results  Component Value Date   CHOLHDL 5.0 09/17/2018   Lab Results  Component Value Date   HGBA1C 6.0 (A) 02/20/2019      Assessment & Plan:   1. History of ST elevation myocardial infarction (STEMI) No signs or symptoms of recurrence noted or reported today.   2. Essential hypertension The current medical regimen is effective; blood pressure is stable at 145/86 today; continue present plan and medications as prescribed. He will continue to take medications as prescribed, to decrease high sodium intake, excessive alcohol intake, increase potassium intake, smoking cessation, and increase physical activity of at least 30 minutes of cardio activity daily. He will continue to follow Heart Healthy or DASH diet.  3. Mixed hyperlipidemia  4. Follow up He will follow up in 6 months.    No orders of the defined types were placed in this encounter.   Orders Placed This Encounter  Procedures   POCT urinalysis dipstick    Referral Orders  No referral(s) requested today   Orders Placed This Encounter  Procedures   POCT urinalysis dipstick    Raliegh Ip,  MSN, FNP-BC [facility] [address] [place], [place] [address] [phone]  Problem List Items Addressed This Visit      Cardiovascular and Mediastinum   Essential hypertension     Other   History of ST elevation myocardial infarction (STEMI) - Primary   Hyperlipidemia    Other Visit Diagnoses    Follow up       Relevant Orders  POCT urinalysis dipstick (Completed)      No orders of the defined types were placed in this encounter.   Follow-up: Return in about 6 months (around 09/17/2019).    Kallie Locks, FNP

## 2019-03-23 ENCOUNTER — Other Ambulatory Visit: Payer: Self-pay | Admitting: Cardiovascular Disease

## 2019-03-23 MED FILL — METOPROLOL TARTRATE 25 MG T: 25 | 30 days supply | Qty: 30 | Fill #1

## 2019-03-24 MED FILL — AMLODIPINE BESYLATE 10 MG T: 10 | 30 days supply | Qty: 30 | Fill #0

## 2019-05-07 ENCOUNTER — Other Ambulatory Visit: Payer: Self-pay | Admitting: Cardiovascular Disease

## 2019-05-07 MED FILL — AMOXICILLIN 875 MG TABS: 875 | 7 days supply | Qty: 14 | Fill #0

## 2019-05-07 MED FILL — LOSARTAN POTASSIUM 50 MG TA: 50 | 30 days supply | Qty: 30 | Fill #1

## 2019-05-07 MED FILL — METOPROLOL TARTRATE 25 MG T: 25 | 30 days supply | Qty: 30 | Fill #0

## 2019-05-07 MED FILL — AMLODIPINE BESYLATE 10 MG T: 10 | 30 days supply | Qty: 30 | Fill #0

## 2019-05-07 MED FILL — TAMSULOSIN HCL 0.4 MG CAP: 0.4 | 30 days supply | Qty: 30 | Fill #1

## 2019-05-07 MED FILL — IBUPROFEN 800 MG TAB: 800 | 7 days supply | Qty: 21 | Fill #0

## 2019-05-07 MED FILL — ATORVASTATIN 80 MG TABLET: 80 | 30 days supply | Qty: 30 | Fill #1

## 2019-05-20 ENCOUNTER — Ambulatory Visit: Payer: BLUE CROSS/BLUE SHIELD | Admitting: Family Medicine

## 2019-05-25 ENCOUNTER — Encounter: Payer: Self-pay | Admitting: Family Medicine

## 2019-05-25 ENCOUNTER — Other Ambulatory Visit: Payer: Self-pay

## 2019-05-25 ENCOUNTER — Ambulatory Visit (INDEPENDENT_AMBULATORY_CARE_PROVIDER_SITE_OTHER): Payer: BLUE CROSS/BLUE SHIELD | Admitting: Family Medicine

## 2019-05-25 VITALS — BP 136/80 | HR 77 | Temp 98.6°F | Ht 72.0 in | Wt 228.5 lb

## 2019-05-25 DIAGNOSIS — M62838 Other muscle spasm: Secondary | ICD-10-CM

## 2019-05-25 DIAGNOSIS — Z09 Encounter for follow-up examination after completed treatment for conditions other than malignant neoplasm: Secondary | ICD-10-CM

## 2019-05-25 DIAGNOSIS — I252 Old myocardial infarction: Secondary | ICD-10-CM | POA: Diagnosis not present

## 2019-05-25 DIAGNOSIS — R5383 Other fatigue: Secondary | ICD-10-CM

## 2019-05-25 DIAGNOSIS — Z0000 Encounter for general adult medical examination without abnormal findings: Secondary | ICD-10-CM

## 2019-05-25 DIAGNOSIS — I10 Essential (primary) hypertension: Secondary | ICD-10-CM | POA: Diagnosis not present

## 2019-05-25 DIAGNOSIS — R42 Dizziness and giddiness: Secondary | ICD-10-CM

## 2019-05-25 DIAGNOSIS — E559 Vitamin D deficiency, unspecified: Secondary | ICD-10-CM

## 2019-05-25 LAB — POCT URINALYSIS DIPSTICK
Bilirubin, UA: NEGATIVE
Blood, UA: NEGATIVE
Glucose, UA: NEGATIVE
Ketones, UA: NEGATIVE
Leukocytes, UA: NEGATIVE
Nitrite, UA: NEGATIVE
Protein, UA: NEGATIVE
Spec Grav, UA: >=1.03 — AB (ref 1.010–1.025)
Urobilinogen, UA: 1 E.U./dL
pH, UA: 5.5 (ref 5.0–8.0)

## 2019-05-25 MED ORDER — MECLIZINE HCL 25 MG PO TABS: 25.0000 mg | ORAL_TABLET | Freq: Three times a day (TID) | ORAL | 6 refills | Status: DC | PRN

## 2019-05-25 MED ORDER — CYCLOBENZAPRINE HCL 10 MG PO TABS: 10.0000 mg | ORAL_TABLET | Freq: Three times a day (TID) | ORAL | 0 refills | Status: DC | PRN

## 2019-05-25 MED FILL — MECLIZINE 25 MG TABLET: 25 | 20 days supply | Qty: 60 | Fill #0

## 2019-05-25 MED FILL — CYCLOBENZAPRINE HCL 10 MG T: 10 | 10 days supply | Qty: 30 | Fill #0

## 2019-05-25 NOTE — Patient Instructions (Addendum)
Dizziness Dizziness is a common problem. It makes you feel unsteady or light-headed. You may feel like you are about to pass out (faint). Dizziness can lead to getting hurt if you stumble or fall. Dizziness can be caused by many things, including:  Medicines.  Not having enough water in your body (dehydration).  Illness. Follow these instructions at home: Eating and drinking   Drink enough fluid to keep your pee (urine) clear or pale yellow. This helps to keep you from getting dehydrated. Try to drink more clear fluids, such as water.  Do not drink alcohol.  Limit how much caffeine you drink or eat, if your doctor tells you to do that.  Limit how much salt (sodium) you drink or eat, if your doctor tells you to do that. Activity   Avoid making quick movements. ? When you stand up from sitting in a chair, steady yourself until you feel okay. ? In the morning, first sit up on the side of the bed. When you feel okay, stand slowly while you hold onto something. Do this until you know that your balance is fine.  If you need to stand in one place for a long time, move your legs often. Tighten and relax the muscles in your legs while you are standing.  Do not drive or use heavy machinery if you feel dizzy.  Avoid bending down if you feel dizzy. Place items in your home so you can reach them easily without leaning over. Lifestyle  Do not use any products that contain nicotine or tobacco, such as cigarettes and e-cigarettes. If you need help quitting, ask your doctor.  Try to lower your stress level. You can do this by using methods such as yoga or meditation. Talk with your doctor if you need help. General instructions  Watch your dizziness for any changes.  Take over-the-counter and prescription medicines only as told by your doctor. Talk with your doctor if you think that you are dizzy because of a medicine that you are taking.  Tell a friend or a family member that you are feeling  dizzy. If he or she notices any changes in your behavior, have this person call your doctor.  Keep all follow-up visits as told by your doctor. This is important. Contact a doctor if:  Your dizziness does not go away.  Your dizziness or light-headedness gets worse.  You feel sick to your stomach (nauseous).  You have trouble hearing.  You have new symptoms.  You are unsteady on your feet.  You feel like the room is spinning. Get help right away if:  You throw up (vomit) or have watery poop (diarrhea), and you cannot eat or drink anything.  You have trouble: ? Talking. ? Walking. ? Swallowing. ? Using your arms, hands, or legs.  You feel generally weak.  You are not thinking clearly, or you have trouble forming sentences. A friend or family member may notice this.  You have: ? Chest pain. ? Pain in your belly (abdomen). ? Shortness of breath. ? Sweating.  Your vision changes.  You are bleeding.  You have a very bad headache.  You have neck pain or a stiff neck.  You have a fever. These symptoms may be an emergency. Do not wait to see if the symptoms will go away. Get medical help right away. Call your local emergency services (911 in the [place] Do not drive yourself to the hospital. Summary  Dizziness makes you feel unsteady or light-headed. You   may feel like you are about to pass out (faint).  Drink enough fluid to keep your pee (urine) clear or pale yellow. Do not drink alcohol.  Avoid making quick movements if you feel dizzy.  Watch your dizziness for any changes. This information is not intended to replace advice given to you by your health care provider. Make sure you discuss any questions you have with your health care provider. Document Revised: 12/28/2016 Document Reviewed: 01/12/2016 Elsevier Patient Education  2020 [employer]. Meclizine tablets or capsules What is this medicine? MECLIZINE (MEK li zeen) is an antihistamine. It is used to  prevent nausea, vomiting, or dizziness caused by motion sickness. It is also used to prevent and treat vertigo (extreme dizziness or a feeling that you or your surroundings are tilting or spinning around). This medicine may be used for other purposes; ask your health care provider or pharmacist if you have questions. COMMON BRAND NAME(S): Antivert, Dramamine Less Drowsy, Dramamine-N, Medivert, Meni-D What should I tell my health care provider before I take this medicine? They need to know if you have any of these conditions:  glaucoma  lung or breathing disease, like asthma  problems urinating  prostate disease  stomach or intestine problems  an unusual or allergic reaction to meclizine, other medicines, foods, dyes, or preservatives  pregnant or trying to get pregnant  breast-feeding How should I use this medicine? Take this medicine by mouth with a glass of water. Follow the directions on the prescription label. If you are using this medicine to prevent motion sickness, take the dose at least 1 hour before travel. If it upsets your stomach, take it with food or milk. Take your doses at regular intervals. Do not take your medicine more often than directed. Talk to your pediatrician regarding the use of this medicine in children. Special care may be needed. Overdosage: If you think you have taken too much of this medicine contact a poison control center or emergency room at once. NOTE: This medicine is only for you. Do not share this medicine with others. What if I miss a dose? If you miss a dose, take it as soon as you can. If it is almost time for your next dose, take only that dose. Do not take double or extra doses. What may interact with this medicine? Do not take this medicine with any of the following medications:  MAOIs like Carbex, Eldepryl, Marplan, Nardil, and Parnate This medicine may also interact with the following medications:  alcohol  antihistamines for allergy,  cough and cold  certain medicines for anxiety or sleep  certain medicines for depression, like amitriptyline, fluoxetine, sertraline  certain medicines for seizures like phenobarbital, primidone  general anesthetics like halothane, isoflurane, methoxyflurane, propofol  local anesthetics like lidocaine, pramoxine, tetracaine  medicines that relax muscles for surgery  narcotic medicines for pain  phenothiazines like chlorpromazine, mesoridazine, prochlorperazine, thioridazine This list may not describe all possible interactions. Give your health care provider a list of all the medicines, herbs, non-prescription drugs, or dietary supplements you use. Also tell them if you smoke, drink alcohol, or use illegal drugs. Some items may interact with your medicine. What should I watch for while using this medicine? Tell your doctor or healthcare professional if your symptoms do not start to get better or if they get worse. You may get drowsy or dizzy. Do not drive, use machinery, or do anything that needs mental alertness until you know how this medicine affects you. Do not stand  or sit up quickly, especially if you are an older patient. This reduces the risk of dizzy or fainting spells. Alcohol may interfere with the effect of this medicine. Avoid alcoholic drinks. Your mouth may get dry. Chewing sugarless gum or sucking hard candy, and drinking plenty of water may help. Contact your doctor if the problem does not go away or is severe. This medicine may cause dry eyes and blurred vision. If you wear contact lenses you may feel some discomfort. Lubricating drops may help. See your eye doctor if the problem does not go away or is severe. What side effects may I notice from receiving this medicine? Side effects that you should report to your doctor or health care professional as soon as possible:  feeling faint or lightheaded, falls  fast, irregular heartbeat Side effects that usually do not require  medical attention (report to your doctor or health care professional if they continue or are bothersome):  constipation  headache  trouble passing urine or change in the amount of urine  trouble sleeping  upset stomach This list may not describe all possible side effects. Call your doctor for medical advice about side effects. You may report side effects to FDA at 1-800-FDA-1088. Where should I keep my medicine? Keep out of the reach of children. Store at room temperature between 15 and 30 degrees C (59 and 86 degrees F). Keep container tightly closed. Throw away any unused medicine after the expiration date. NOTE: This sheet is a summary. It may not cover all possible information. If you have questions about this medicine, talk to your doctor, pharmacist, or health care provider.  2020 Elsevier/Gold Standard (2015-01-26 19:41:02) Cyclobenzaprine tablets What is this medicine? CYCLOBENZAPRINE (sye kloe BEN za preen) is a muscle relaxer. It is used to treat muscle pain, spasms, and stiffness. This medicine may be used for other purposes; ask your health care provider or pharmacist if you have questions. COMMON BRAND NAME(S): Fexmid, Flexeril What should I tell my health care provider before I take this medicine? They need to know if you have any of these conditions:  heart disease, irregular heartbeat, or previous heart attack  liver disease  thyroid problem  an unusual or allergic reaction to cyclobenzaprine, tricyclic antidepressants, lactose, other medicines, foods, dyes, or preservatives  pregnant or trying to get pregnant  breast-feeding How should I use this medicine? Take this medicine by mouth with a glass of water. Follow the directions on the prescription label. If this medicine upsets your stomach, take it with food or milk. Take your medicine at regular intervals. Do not take it more often than directed. Talk to your pediatrician regarding the use of this medicine in  children. Special care may be needed. Overdosage: If you think you have taken too much of this medicine contact a poison control center or emergency room at once. NOTE: This medicine is only for you. Do not share this medicine with others. What if I miss a dose? If you miss a dose, take it as soon as you can. If it is almost time for your next dose, take only that dose. Do not take double or extra doses. What may interact with this medicine? Do not take this medicine with any of the following medications:  MAOIs like Carbex, Eldepryl, Marplan, Nardil, and Parnate  narcotic medicines for cough  safinamide This medicine may also interact with the following medications:  alcohol  bupropion  antihistamines for allergy, cough and cold  certain medicines for anxiety  or sleep  certain medicines for bladder problems like oxybutynin, tolterodine  certain medicines for depression like amitriptyline, fluoxetine, sertraline  certain medicines for Parkinson's disease like benztropine, trihexyphenidyl  certain medicines for seizures like phenobarbital, primidone  certain medicines for stomach problems like dicyclomine, hyoscyamine  certain medicines for travel sickness like scopolamine  general anesthetics like halothane, isoflurane, methoxyflurane, propofol  ipratropium  local anesthetics like lidocaine, pramoxine, tetracaine  medicines that relax muscles for surgery  narcotic medicines for pain  phenothiazines like chlorpromazine, mesoridazine, prochlorperazine, thioridazine  verapamil This list may not describe all possible interactions. Give your health care provider a list of all the medicines, herbs, non-prescription drugs, or dietary supplements you use. Also tell them if you smoke, drink alcohol, or use illegal drugs. Some items may interact with your medicine. What should I watch for while using this medicine? Tell your doctor or health care professional if your symptoms  do not start to get better or if they get worse. You may get drowsy or dizzy. Do not drive, use machinery, or do anything that needs mental alertness until you know how this medicine affects you. Do not stand or sit up quickly, especially if you are an older patient. This reduces the risk of dizzy or fainting spells. Alcohol may interfere with the effect of this medicine. Avoid alcoholic drinks. If you are taking another medicine that also causes drowsiness, you may have more side effects. Give your health care provider a list of all medicines you use. Your doctor will tell you how much medicine to take. Do not take more medicine than directed. Call emergency for help if you have problems breathing or unusual sleepiness. Your mouth may get dry. Chewing sugarless gum or sucking hard candy, and drinking plenty of water may help. Contact your doctor if the problem does not go away or is severe. What side effects may I notice from receiving this medicine? Side effects that you should report to your doctor or health care professional as soon as possible:  allergic reactions like skin rash, itching or hives, swelling of the face, lips, or tongue  breathing problems  chest pain  fast, irregular heartbeat  hallucinations  seizures  unusually weak or tired Side effects that usually do not require medical attention (report to your doctor or health care professional if they continue or are bothersome):  headache  nausea, vomiting This list may not describe all possible side effects. Call your doctor for medical advice about side effects. You may report side effects to FDA at 1-800-FDA-1088. Where should I keep my medicine? Keep out of the reach of children. Store at room temperature between 15 and 30 degrees C (59 and 86 degrees F). Keep container tightly closed. Throw away any unused medicine after the expiration date. NOTE: This sheet is a summary. It may not cover all possible information. If  you have questions about this medicine, talk to your doctor, pharmacist, or health care provider.  2020 Elsevier/Gold Standard (2017-11-27 12:49:26)  Muscle Cramps and Spasms Muscle cramps and spasms are when muscles tighten by themselves. They usually get better within minutes. Muscle cramps are painful. They are usually stronger and last longer than muscle spasms. Muscle spasms may or may not be painful. They can last a few seconds or much longer. Cramps and spasms can affect any muscle, but they occur most often in the calf muscles of the leg. They are usually not caused by a serious problem. In many cases, the cause is not  known. Some common causes include:  Doing more physical work or exercise than your body is ready for.  Using the muscles too much (overuse) by repeating certain movements too many times.  Staying in a certain position for a long time.  Playing a sport or doing an activity without preparing properly.  Using bad form or technique while playing a sport or doing an activity.  Not having enough water in your body (dehydration).  Injury.  Side effects of some medicines.  Low levels of the salts and minerals in your blood (electrolytes), such as low potassium or calcium. Follow these instructions at home: Managing pain and stiffness      Massage, stretch, and relax the muscle. Do this for many minutes at a time.  If told, put heat on tight or tense muscles as often as told by your doctor. Use the heat source that your doctor recommends, such as a moist heat pack or a heating pad. ? Place a towel between your skin and the heat source. ? Leave the heat on for 20-30 minutes. ? Remove the heat if your skin turns bright red. This is very important if you are not able to feel pain, heat, or cold. You may have a greater risk of getting burned.  If told, put ice on the affected area. This may help if you are sore or have pain after a cramp or spasm. ? Put ice in a  plastic bag. ? Place a towel between your skin and the bag. ? Leave the ice on for 20 minutes, 2-3 times a day.  Try taking hot showers or baths to help relax tight muscles. Eating and drinking  Drink enough fluid to keep your pee (urine) pale yellow.  Eat a healthy diet to help ensure that your muscles work well. This should include: ? Fruits and vegetables. ? Lean protein. ? Whole grains. ? Low-fat or nonfat dairy products. General instructions  If you are having cramps often, avoid intense exercise for several days.  Take over-the-counter and prescription medicines only as told by your doctor.  Watch for any changes in your symptoms.  Keep all follow-up visits as told by your doctor. This is important. Contact a doctor if:  Your cramps or spasms get worse or happen more often.  Your cramps or spasms do not get better with time. Summary  Muscle cramps and spasms are when muscles tighten by themselves. They usually get better within minutes.  Cramps and spasms occur most often in the calf muscles of the leg.  Massage, stretch, and relax the muscle. This may help the cramp or spasm go away.  Drink enough fluid to keep your pee (urine) pale yellow. This information is not intended to replace advice given to you by your health care provider. Make sure you discuss any questions you have with your health care provider. Document Revised: 05/20/2017 Document Reviewed: 05/20/2017 Elsevier Patient Education  2020 [employer].

## 2019-05-25 NOTE — Progress Notes (Signed)
[facility]  Established Patient Office Visit  Subjective:  Patient ID: Vincent [name], male    DOB: 1955/08/29  Age: 64 y.o. MRN: [id number]  CC:  Chief Complaint  Patient presents with  . Follow-up  . Rash    HPI Vincent [name] is a 64 year old male who presents for Follow Up today.   Past Medical History:  Diagnosis Date  . Gout   . Hyperlipidemia   . Hypertension   . STEMI (ST elevation myocardial infarction) ([place]) 11/25/2016   small vessel dz at cath, med rx, nl EF   Current Status: Since his last office visit, he is doing well with no complaints. He continues to have dizziness, increased in the morning after awakening. He has c/o increased fatigue lately. He denies fevers, chills, fatigue, recent infections, weight loss, and night sweats. He has not had any headaches, visual changes, dizziness, and falls. No chest pain, heart palpitations, cough and shortness of breath reported. Denies GI problems such as nausea, vomiting, diarrhea, and constipation. He has no reports of blood in stools, dysuria and hematuria. No depression or anxiety reported today. He denies suicidal ideations, homicidal ideations, or auditory hallucinations. He is taking all medications as prescribed. He denies pain today.   Past Surgical History:  Procedure Laterality Date  . LEFT HEART CATH AND CORONARY ANGIOGRAPHY N/A 11/25/2016   Procedure: LEFT HEART CATH AND CORONARY ANGIOGRAPHY;  Surgeon: Lorretta Harp, MD;  Location: [facility];  Service: Cardiovascular;  Laterality: N/A;    Family History  Problem Relation Age of Onset  . Cancer Mother 31  . CAD Father 23  . CAD Brother 36  . AAA (abdominal aortic aneurysm) Maternal Grandmother 90  . AAA (abdominal aortic aneurysm) Paternal Grandmother 7    Social History   Socioeconomic History  . Marital status: Married    Spouse name: Not on file  . Number of children: Not on file  .  Years of education: Not on file  . Highest education level: Not on file  Occupational History  . Occupation: Patient assistance    Employer: [employer]  Tobacco Use  . Smoking status: Never Smoker  . Smokeless tobacco: Never Used  Substance and Sexual Activity  . Alcohol use: Yes    Comment: occ  . Drug use: No  . Sexual activity: Yes  Other Topics Concern  . Not on file  Social History Narrative  . Not on file   Social Determinants of Health   Financial Resource Strain:   . Difficulty of Paying Living Expenses:   Food Insecurity:   . Worried About Charity fundraiser in the Last Year:   . Arboriculturist in the Last Year:   Transportation Needs:   . Film/video editor (Medical):   Marland Kitchen Lack of Transportation (Non-Medical):   Physical Activity:   . Days of Exercise per Week:   . Minutes of Exercise per Session:   Stress:   . Feeling of Stress :   Social Connections:   . Frequency of Communication with Friends and Family:   . Frequency of Social Gatherings with Friends and Family:   . Attends Religious Services:   . Active Member of Clubs or Organizations:   . Attends Archivist Meetings:   Marland Kitchen Marital Status:   Intimate Partner Violence:   . Fear of Current or Ex-Partner:   . Emotionally Abused:   .  Physically Abused:   . Sexually Abused:     Outpatient Medications Prior to Visit  Medication Sig Dispense Refill  . amLODipine (NORVASC) 10 MG tablet TAKE 1 TABLET (10 MG TOTAL) BY MOUTH DAILY. NEED OFFICE VISIT 30 tablet 0  . aspirin 81 MG chewable tablet Chew 1 tablet (81 mg total) by mouth daily. 90 tablet 1  . atorvastatin (LIPITOR) 80 MG tablet Take 1 tablet (80 mg total) by mouth daily at 6 PM. 30 tablet 3  . clopidogrel (PLAVIX) 75 MG tablet TAKE 1 TABLET BY MOUTH DAILY. 90 tablet 1  . losartan (COZAAR) 50 MG tablet Take 1 tablet (50 mg total) by mouth daily. 30 tablet 3  . metoprolol tartrate (LOPRESSOR) 25 MG tablet  TAKE 1/2 TABLETS (12.5 MG TOTAL) BY MOUTH 2 TIMES DAILY. 30 tablet 4  . nitroGLYCERIN (NITROSTAT) 0.4 MG SL tablet Place 1 tablet (0.4 mg total) under the tongue every 5 (five) minutes as needed. 25 tablet 2  . tamsulosin (FLOMAX) 0.4 MG CAPS capsule Take 1 capsule (0.4 mg total) by mouth daily. 30 capsule 3   No facility-administered medications prior to visit.    No Known Allergies  ROS Review of Systems  Constitutional: Negative.   HENT: Negative.   Eyes: Negative.   Respiratory: Negative.   Cardiovascular: Negative.   Gastrointestinal: Negative.   Endocrine: Negative.   Genitourinary: Negative.   Musculoskeletal: Positive for arthralgias (general joint pain).  Skin: Negative.   Allergic/Immunologic: Negative.   Neurological: Positive for dizziness (occasiona) and headaches (occasional).  Hematological: Negative.   Psychiatric/Behavioral: Negative.       Objective:    Physical Exam  Constitutional: He is oriented to person, place, and time. He appears well-developed and well-nourished.  HENT:  Head: Normocephalic and atraumatic.  Eyes: Conjunctivae are normal.  Cardiovascular: Normal rate, regular rhythm, normal heart sounds and intact distal pulses.  Pulmonary/Chest: Effort normal and breath sounds normal.  Abdominal: Soft.  Musculoskeletal:        General: Normal range of motion.     Cervical back: Normal range of motion and neck supple.  Neurological: He is alert and oriented to person, place, and time.  Skin: Skin is warm and dry.  Psychiatric: He has a normal mood and affect. His behavior is normal. Judgment and thought content normal.  Nursing note and vitals reviewed.   BP 136/80   Pulse 77   Temp 98.6 F (37 C)   Ht 6' (1.829 m)   Wt 228 lb 8 oz (103.6 kg)   SpO2 100%   BMI 30.99 kg/m  Wt Readings from Last 3 Encounters:  05/25/19 228 lb 8 oz (103.6 kg)  03/17/19 224 lb (101.6 kg)  03/03/19 224 lb 6.4 oz (101.8 kg)     Health Maintenance Due   Topic Date Due  . COVID-19 Vaccine (1) Never done  . TETANUS/TDAP  Never done  . COLONOSCOPY  Never done    There are no preventive care reminders to display for this patient.  Lab Results  Component Value Date   TSH 1.710 09/17/2018   Lab Results  Component Value Date   WBC 7.3 03/03/2019   HGB 14.0 03/03/2019   HCT 43.3 03/03/2019   MCV 84.4 03/03/2019   PLT 201 03/03/2019   Lab Results  Component Value Date   NA 140 03/03/2019   K 3.8 03/03/2019   CO2 29 03/03/2019   GLUCOSE 127 (H) 03/03/2019   BUN 12 03/03/2019   CREATININE 0.84  03/03/2019   BILITOT 0.4 03/03/2019   ALKPHOS 58 03/03/2019   AST 20 03/03/2019   ALT 18 03/03/2019   PROT 7.2 03/03/2019   ALBUMIN 3.8 03/03/2019   CALCIUM 9.1 03/03/2019   ANIONGAP 7 03/03/2019   Lab Results  Component Value Date   CHOL 218 (H) 09/17/2018   Lab Results  Component Value Date   HDL 44 09/17/2018   Lab Results  Component Value Date   LDLCALC 140 (H) 09/17/2018   Lab Results  Component Value Date   TRIG 191 (H) 09/17/2018   Lab Results  Component Value Date   CHOLHDL 5.0 09/17/2018   Lab Results  Component Value Date   HGBA1C 6.0 (A) 02/20/2019      Assessment & Plan:   1. Essential hypertension The current medical regimen is effective; blood pressure is stable at 136/80 today; continue present plan and medications as prescribed. He will continue to take medications as prescribed, to decrease high sodium intake, excessive alcohol intake, increase potassium intake, smoking cessation, and increase physical activity of at least 30 minutes of cardio activity daily. He will continue to follow Heart Healthy or DASH diet. - POCT urinalysis dipstick  2. Dizziness We will initiate Meclizine.  - meclizine (ANTIVERT) 25 MG tablet; Take 1 tablet (25 mg total) by mouth 3 (three) times daily as needed for dizziness.  Dispense: 60 tablet; Refill: 6  3. Muscle spasm - cyclobenzaprine (FLEXERIL) 10 MG tablet; Take  1 tablet (10 mg total) by mouth 3 (three) times daily as needed for muscle spasms.  Dispense: 30 tablet; Refill: 0  4. History of ST elevation myocardial infarction (STEMI) Stable. No signs or symptoms of respiratory distress noted or reported.   5. Vitamin D deficiency - Vitamin D, 25-hydroxy  6. Fatigue, unspecified type - Iron and TIBC([facility])  7. Healthcare maintenance - Vitamin B12  8. Follow up He will follow up in 6 months.  Meds ordered this encounter  Medications  . meclizine (ANTIVERT) 25 MG tablet    Sig: Take 1 tablet (25 mg total) by mouth 3 (three) times daily as needed for dizziness.    Dispense:  60 tablet    Refill:  6  . cyclobenzaprine (FLEXERIL) 10 MG tablet    Sig: Take 1 tablet (10 mg total) by mouth 3 (three) times daily as needed for muscle spasms.    Dispense:  30 tablet    Refill:  0    Orders Placed This Encounter  Procedures  . Vitamin B12  . Vitamin D, 25-hydroxy  . Iron and TIBC([facility])  . POCT urinalysis dipstick    Referral Orders  No referral(s) requested today   Raliegh Ip,  MSN, FNP-BC [facility] [address] [place], [place] [address] [phone] fax   Problem List Items Addressed This Visit      Cardiovascular and Mediastinum   Essential hypertension - Primary   Relevant Orders   POCT urinalysis dipstick (Completed)     Other   History of ST elevation myocardial infarction (STEMI)    Other Visit Diagnoses    Dizziness       Relevant Medications   meclizine (ANTIVERT) 25 MG tablet   Muscle spasm       Relevant Medications   cyclobenzaprine (FLEXERIL) 10 MG tablet   Vitamin D deficiency       Relevant Orders   Vitamin D, 25-hydroxy (Completed)   Fatigue, unspecified type  Relevant Orders   Iron and TIBC([facility]) (Completed)   Healthcare maintenance       Relevant Orders   Vitamin B12  (Completed)   Follow up          Meds ordered this encounter  Medications  . meclizine (ANTIVERT) 25 MG tablet    Sig: Take 1 tablet (25 mg total) by mouth 3 (three) times daily as needed for dizziness.    Dispense:  60 tablet    Refill:  6  . cyclobenzaprine (FLEXERIL) 10 MG tablet    Sig: Take 1 tablet (10 mg total) by mouth 3 (three) times daily as needed for muscle spasms.    Dispense:  30 tablet    Refill:  0    Follow-up: Return in about 6 months (around 11/25/2019).    Kallie Locks, FNP

## 2019-05-26 DIAGNOSIS — R42 Dizziness and giddiness: Secondary | ICD-10-CM

## 2019-05-26 DIAGNOSIS — R5383 Other fatigue: Secondary | ICD-10-CM | POA: Insufficient documentation

## 2019-05-26 DIAGNOSIS — M62838 Other muscle spasm: Secondary | ICD-10-CM | POA: Insufficient documentation

## 2019-05-26 HISTORY — DX: Other muscle spasm: M62.838

## 2019-05-26 HISTORY — DX: Other fatigue: R53.83

## 2019-05-26 HISTORY — DX: Dizziness and giddiness: R42

## 2019-05-26 LAB — VITAMIN D 25 HYDROXY (VIT D DEFICIENCY, FRACTURES): Vit D, 25-Hydroxy: 28.4 ng/mL — ABNORMAL LOW (ref 30.0–100.0)

## 2019-05-26 LAB — IRON AND TIBC
Iron Saturation: 17 % (ref 15–55)
Iron: 35 ug/dL — ABNORMAL LOW (ref 38–169)
Total Iron Binding Capacity: 205 ug/dL — ABNORMAL LOW (ref 250–450)
UIBC: 170 ug/dL (ref 111–343)

## 2019-05-26 LAB — VITAMIN B12: Vitamin B-12: 481 pg/mL (ref 232–1245)

## 2019-07-01 ENCOUNTER — Other Ambulatory Visit: Payer: Self-pay | Admitting: Cardiovascular Disease

## 2019-07-01 MED FILL — METOPROLOL TARTRATE 25 MG T: 25 | 30 days supply | Qty: 30 | Fill #1

## 2019-07-01 MED FILL — AMLODIPINE BESYLATE 10 MG T: 10 | 15 days supply | Qty: 15 | Fill #0

## 2019-07-01 MED FILL — SSD 1% CREAM: 1 | 20 days supply | Qty: 50 | Fill #0

## 2019-07-01 MED FILL — tiZANidine HCL 4 MG TABS: 4 | 5 days supply | Qty: 20 | Fill #0

## 2019-07-01 MED FILL — LOSARTAN POTASSIUM 50 MG TA: 50 | 30 days supply | Qty: 30 | Fill #2

## 2019-07-01 MED FILL — NAPROXEN 500 MG TABS: 500 | 15 days supply | Qty: 30 | Fill #0

## 2019-07-29 ENCOUNTER — Other Ambulatory Visit: Payer: Self-pay | Admitting: Cardiovascular Disease

## 2019-07-29 MED FILL — METOPROLOL TARTRATE 25 MG T: 25 | 30 days supply | Qty: 30 | Fill #2

## 2019-07-29 MED FILL — ATORVASTATIN 80 MG TABLET: 80 | 30 days supply | Qty: 30 | Fill #2

## 2019-07-29 MED FILL — AMLODIPINE BESYLATE 10 MG T: 10 | 15 days supply | Qty: 15 | Fill #0

## 2019-08-09 DIAGNOSIS — G4730 Sleep apnea, unspecified: Secondary | ICD-10-CM

## 2019-08-09 HISTORY — DX: Sleep apnea, unspecified: G47.30

## 2019-08-31 ENCOUNTER — Ambulatory Visit: Payer: BLUE CROSS/BLUE SHIELD | Admitting: Family Medicine

## 2019-09-02 ENCOUNTER — Other Ambulatory Visit: Payer: Self-pay

## 2019-09-02 ENCOUNTER — Ambulatory Visit (INDEPENDENT_AMBULATORY_CARE_PROVIDER_SITE_OTHER): Payer: BLUE CROSS/BLUE SHIELD | Admitting: Family Medicine

## 2019-09-02 ENCOUNTER — Encounter: Payer: Self-pay | Admitting: Family Medicine

## 2019-09-02 VITALS — BP 137/97 | HR 83 | Temp 98.1°F | Resp 17 | Ht 72.0 in | Wt 227.0 lb

## 2019-09-02 DIAGNOSIS — I10 Essential (primary) hypertension: Secondary | ICD-10-CM | POA: Diagnosis not present

## 2019-09-02 DIAGNOSIS — D649 Anemia, unspecified: Secondary | ICD-10-CM

## 2019-09-02 DIAGNOSIS — R5383 Other fatigue: Secondary | ICD-10-CM

## 2019-09-02 DIAGNOSIS — G629 Polyneuropathy, unspecified: Secondary | ICD-10-CM

## 2019-09-02 DIAGNOSIS — Z09 Encounter for follow-up examination after completed treatment for conditions other than malignant neoplasm: Secondary | ICD-10-CM

## 2019-09-02 MED ORDER — GABAPENTIN 100 MG PO CAPS: 100.0000 mg | ORAL_CAPSULE | Freq: Three times a day (TID) | ORAL | 3 refills | Status: DC

## 2019-09-02 MED FILL — GABAPENTIN 100 MG CAPSULE: 100 | 30 days supply | Qty: 90 | Fill #0
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
## 2019-09-02 NOTE — Patient Instructions (Addendum)
Gabapentin capsules or tablets What is this medicine? GABAPENTIN (GA ba pen tin) is used to control seizures in certain types of epilepsy. It is also used to treat certain types of nerve pain. This medicine may be used for other purposes; ask your health care provider or pharmacist if you have questions. COMMON BRAND NAME(S): Active-PAC with Gabapentin, Gabarone, Neurontin What should I tell my health care provider before I take this medicine? They need to know if you have any of these conditions:  history of drug abuse or alcohol abuse problem  kidney disease  lung or breathing disease  suicidal thoughts, plans, or attempt; a previous suicide attempt by you or a family member  an unusual or allergic reaction to gabapentin, other medicines, foods, dyes, or preservatives  pregnant or trying to get pregnant  breast-feeding How should I use this medicine? Take this medicine by mouth with a glass of water. Follow the directions on the prescription label. You can take it with or without food. If it upsets your stomach, take it with food. Take your medicine at regular intervals. Do not take it more often than directed. Do not stop taking except on your doctor's advice. If you are directed to break the 600 or 800 mg tablets in half as part of your dose, the extra half tablet should be used for the next dose. If you have not used the extra half tablet within 28 days, it should be thrown away. A special MedGuide will be given to you by the pharmacist with each prescription and refill. Be sure to read this information carefully each time. Talk to your pediatrician regarding the use of this medicine in children. While this drug may be prescribed for children as young as 3 years for selected conditions, precautions do apply. Overdosage: If you think you have taken too much of this medicine contact a poison control center or emergency room at once. NOTE: This medicine is only for you. Do not share this  medicine with others. What if I miss a dose? If you miss a dose, take it as soon as you can. If it is almost time for your next dose, take only that dose. Do not take double or extra doses. What may interact with this medicine? This medicine may interact with the following medications:  alcohol  antihistamines for allergy, cough, and cold  certain medicines for anxiety or sleep  certain medicines for depression like amitriptyline, fluoxetine, sertraline  certain medicines for seizures like phenobarbital, primidone  certain medicines for stomach problems  general anesthetics like halothane, isoflurane, methoxyflurane, propofol  local anesthetics like lidocaine, pramoxine, tetracaine  medicines that relax muscles for surgery  narcotic medicines for pain  phenothiazines like chlorpromazine, mesoridazine, prochlorperazine, thioridazine This list may not describe all possible interactions. Give your health care provider a list of all the medicines, herbs, non-prescription drugs, or dietary supplements you use. Also tell them if you smoke, drink alcohol, or use illegal drugs. Some items may interact with your medicine. What should I watch for while using this medicine? Visit your doctor or health care provider for regular checks on your progress. You may want to keep a record at home of how you feel your condition is responding to treatment. You may want to share this information with your doctor or health care provider at each visit. You should contact your doctor or health care provider if your seizures get worse or if you have any new types of seizures. Do not stop taking   this medicine or any of your seizure medicines unless instructed by your doctor or health care provider. Stopping your medicine suddenly can increase your seizures or their severity. This medicine may cause serious skin reactions. They can happen weeks to months after starting the medicine. Contact your health care  provider right away if you notice fevers or flu-like symptoms with a rash. The rash may be red or purple and then turn into blisters or peeling of the skin. Or, you might notice a red rash with swelling of the face, lips or lymph nodes in your neck or under your arms. Wear a medical identification bracelet or chain if you are taking this medicine for seizures, and carry a card that lists all your medications. You may get drowsy, dizzy, or have blurred vision. Do not drive, use machinery, or do anything that needs mental alertness until you know how this medicine affects you. To reduce dizzy or fainting spells, do not sit or stand up quickly, especially if you are an older patient. Alcohol can increase drowsiness and dizziness. Avoid alcoholic drinks. Your mouth may get dry. Chewing sugarless gum or sucking hard candy, and drinking plenty of water will help. The use of this medicine may increase the chance of suicidal thoughts or actions. Pay special attention to how you are responding while on this medicine. Any worsening of mood, or thoughts of suicide or dying should be reported to your health care provider right away. Women who become pregnant while using this medicine may enroll in the [place] American Antiepileptic Drug Pregnancy Registry by calling [phone]. This registry collects information about the safety of antiepileptic drug use during pregnancy. What side effects may I notice from receiving this medicine? Side effects that you should report to your doctor or health care professional as soon as possible:  allergic reactions like skin rash, itching or hives, swelling of the face, lips, or tongue  breathing problems  rash, fever, and swollen lymph nodes  redness, blistering, peeling or loosening of the skin, including inside the mouth  suicidal thoughts, mood changes Side effects that usually do not require medical attention (report to your doctor or health care professional if they  continue or are bothersome):  dizziness  drowsiness  headache  nausea, vomiting  swelling of ankles, feet, hands  tiredness This list may not describe all possible side effects. Call your doctor for medical advice about side effects. You may report side effects to FDA at 1-800-FDA-1088. Where should I keep my medicine? Keep out of reach of children. This medicine may cause accidental overdose and death if it taken by other adults, children, or pets. Mix any unused medicine with a substance like cat litter or coffee grounds. Then throw the medicine away in a sealed container like a sealed bag or a coffee can with a lid. Do not use the medicine after the expiration date. Store at room temperature between 15 and 30 degrees C (59 and 86 degrees F). NOTE: This sheet is a summary. It may not cover all possible information. If you have questions about this medicine, talk to your doctor, pharmacist, or health care provider.  2020 Elsevier/Gold Standard (2018-03-28 14:16:43)   Peripheral Neuropathy Peripheral neuropathy is a type of nerve damage. It affects nerves that carry signals between the spinal cord and the arms, legs, and the rest of the body (peripheral nerves). It does not affect nerves in the spinal cord or brain. In peripheral neuropathy, one nerve or a group of nerves may  be damaged. Peripheral neuropathy is a broad category that includes many specific nerve disorders, like diabetic neuropathy, hereditary neuropathy, and carpal tunnel syndrome. What are the causes? This condition may be caused by:  Diabetes. This is the most common cause of peripheral neuropathy.  Nerve injury.  Pressure or stress on a nerve that lasts a long time.  Lack (deficiency) of B vitamins. This can result from alcoholism, poor diet, or a restricted diet.  Infections.  Autoimmune diseases, such as rheumatoid arthritis and systemic lupus erythematosus.  Nerve diseases that are passed from parent to  child (inherited).  Some medicines, such as cancer medicines (chemotherapy).  Poisonous (toxic) substances, such as lead and mercury.  Too little blood flowing to the legs.  Kidney disease.  Thyroid disease. In some cases, the cause of this condition is not known. What are the signs or symptoms? Symptoms of this condition depend on which of your nerves is damaged. Common symptoms include:  Loss of feeling (numbness) in the feet, hands, or both.  Tingling in the feet, hands, or both.  Burning pain.  Very sensitive skin.  Weakness.  Not being able to move a part of the body (paralysis).  Muscle twitching.  Clumsiness or poor coordination.  Loss of balance.  Not being able to control your bladder.  Feeling dizzy.  Sexual problems. How is this diagnosed? Diagnosing and finding the cause of peripheral neuropathy can be difficult. Your health care provider will take your medical history and do a physical exam. A neurological exam will also be done. This involves checking things that are affected by your brain, spinal cord, and nerves (nervous system). For example, your health care provider will check your reflexes, how you move, and what you can feel. You may have other tests, such as:  Blood tests.  Electromyogram (EMG) and nerve conduction tests. These tests check nerve function and how well the nerves are controlling the muscles.  Imaging tests, such as CT scans or MRI to rule out other causes of your symptoms.  Removing a small piece of nerve to be examined in a lab (nerve biopsy). This is rare.  Removing and examining a small amount of the fluid that surrounds the brain and spinal cord (lumbar puncture). This is rare. How is this treated? Treatment for this condition may involve:  Treating the underlying cause of the neuropathy, such as diabetes, kidney disease, or vitamin deficiencies.  Stopping medicines that can cause neuropathy, such as  chemotherapy.  Medicine to relieve pain. Medicines may include: ? Prescription or over-the-counter pain medicine. ? Antiseizure medicine. ? Antidepressants. ? Pain-relieving patches that are applied to painful areas of skin.  Surgery to relieve pressure on a nerve or to destroy a nerve that is causing pain.  Physical therapy to help improve movement and balance.  Devices to help you move around (assistive devices). Follow these instructions at home: Medicines  Take over-the-counter and prescription medicines only as told by your health care provider. Do not take any other medicines without first asking your health care provider.  Do not drive or use heavy machinery while taking prescription pain medicine. Lifestyle   Do not use any products that contain nicotine or tobacco, such as cigarettes and e-cigarettes. Smoking keeps blood from reaching damaged nerves. If you need help quitting, ask your health care provider.  Avoid or limit alcohol. Too much alcohol can cause a vitamin B deficiency, and vitamin B is needed for healthy nerves.  Eat a healthy diet. This includes: ?  Eating foods that are high in fiber, such as fresh fruits and vegetables, whole grains, and beans. ? Limiting foods that are high in fat and processed sugars, such as fried or sweet foods. General instructions   If you have diabetes, work closely with your health care provider to keep your blood sugar under control.  If you have numbness in your feet: ? Check every day for signs of injury or infection. Watch for redness, warmth, and swelling. ? Wear padded socks and comfortable shoes. These help protect your feet.  Develop a good support system. Living with peripheral neuropathy can be stressful. Consider talking with a mental health specialist or joining a support group.  Use assistive devices and attend physical therapy as told by your health care provider. This may include using a walker or a cane.  Keep  all follow-up visits as told by your health care provider. This is important. Contact a health care provider if:  You have new signs or symptoms of peripheral neuropathy.  You are struggling emotionally from dealing with peripheral neuropathy.  Your pain is not well-controlled. Get help right away if:  You have an injury or infection that is not healing normally.  You develop new weakness in an arm or leg.  You fall frequently. Summary  Peripheral neuropathy is when the nerves in the arms, or legs are damaged, resulting in numbness, weakness, or pain.  There are many causes of peripheral neuropathy, including diabetes, pinched nerves, vitamin deficiencies, autoimmune disease, and hereditary conditions.  Diagnosing and finding the cause of peripheral neuropathy can be difficult. Your health care provider will take your medical history, do a physical exam, and do tests, including blood tests and nerve function tests.  Treatment involves treating the underlying cause of the neuropathy and taking medicines to help control pain. Physical therapy and assistive devices may also help. This information is not intended to replace advice given to you by your health care provider. Make sure you discuss any questions you have with your health care provider. Document Revised: 12/07/2016 Document Reviewed: 03/05/2016 Elsevier Patient Education  2020 [employer].

## 2019-09-03 ENCOUNTER — Encounter: Payer: Self-pay | Admitting: Family Medicine

## 2019-09-03 LAB — CBC WITH DIFFERENTIAL/PLATELET
Basophils Absolute: 0 10*3/uL (ref 0.0–0.2)
Basos: 0 %
EOS (ABSOLUTE): 0.1 10*3/uL (ref 0.0–0.4)
Eos: 3 %
Hematocrit: 42 % (ref 37.5–51.0)
Hemoglobin: 13.8 g/dL (ref 13.0–17.7)
Immature Grans (Abs): 0 10*3/uL (ref 0.0–0.1)
Immature Granulocytes: 0 %
Lymphocytes Absolute: 1.8 10*3/uL (ref 0.7–3.1)
Lymphs: 32 %
MCH: 28 pg (ref 26.6–33.0)
MCHC: 32.9 g/dL (ref 31.5–35.7)
MCV: 85 fL (ref 79–97)
Monocytes Absolute: 0.6 10*3/uL (ref 0.1–0.9)
Monocytes: 11 %
Neutrophils Absolute: 3.1 10*3/uL (ref 1.4–7.0)
Neutrophils: 54 %
Platelets: 160 10*3/uL (ref 150–450)
RBC: 4.93 x10E6/uL (ref 4.14–5.80)
RDW: 14.3 % (ref 11.6–15.4)
WBC: 5.6 10*3/uL (ref 3.4–10.8)

## 2019-09-03 LAB — IRON AND TIBC
Iron Saturation: 35 % (ref 15–55)
Iron: 74 ug/dL (ref 38–169)
Total Iron Binding Capacity: 210 ug/dL — ABNORMAL LOW (ref 250–450)
UIBC: 136 ug/dL (ref 111–343)

## 2019-09-09 DIAGNOSIS — A048 Other specified bacterial intestinal infections: Secondary | ICD-10-CM | POA: Insufficient documentation

## 2019-09-09 HISTORY — DX: Other specified bacterial intestinal infections: A04.8

## 2019-09-16 ENCOUNTER — Other Ambulatory Visit: Payer: Self-pay

## 2019-09-16 ENCOUNTER — Encounter: Payer: Self-pay | Admitting: Family Medicine

## 2019-09-16 ENCOUNTER — Ambulatory Visit (INDEPENDENT_AMBULATORY_CARE_PROVIDER_SITE_OTHER): Payer: BLUE CROSS/BLUE SHIELD | Admitting: Family Medicine

## 2019-09-16 VITALS — BP 155/83 | Temp 97.7°F | Ht 71.0 in | Wt 230.0 lb

## 2019-09-16 DIAGNOSIS — R42 Dizziness and giddiness: Secondary | ICD-10-CM

## 2019-09-16 DIAGNOSIS — I252 Old myocardial infarction: Secondary | ICD-10-CM

## 2019-09-16 DIAGNOSIS — Z09 Encounter for follow-up examination after completed treatment for conditions other than malignant neoplasm: Secondary | ICD-10-CM

## 2019-09-16 DIAGNOSIS — R5383 Other fatigue: Secondary | ICD-10-CM

## 2019-09-16 DIAGNOSIS — I10 Essential (primary) hypertension: Secondary | ICD-10-CM

## 2019-09-16 DIAGNOSIS — G629 Polyneuropathy, unspecified: Secondary | ICD-10-CM | POA: Diagnosis not present

## 2019-09-16 DIAGNOSIS — K219 Gastro-esophageal reflux disease without esophagitis: Secondary | ICD-10-CM

## 2019-09-16 DIAGNOSIS — M62838 Other muscle spasm: Secondary | ICD-10-CM

## 2019-09-16 DIAGNOSIS — R0683 Snoring: Secondary | ICD-10-CM

## 2019-09-16 DIAGNOSIS — R400 Somnolence: Secondary | ICD-10-CM
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
## 2019-09-17 ENCOUNTER — Encounter: Payer: Self-pay | Admitting: Family Medicine

## 2019-09-18 ENCOUNTER — Encounter: Payer: Self-pay | Admitting: Family Medicine

## 2019-09-18 ENCOUNTER — Other Ambulatory Visit: Payer: Self-pay | Admitting: Family Medicine

## 2019-09-18 DIAGNOSIS — B9681 Helicobacter pylori [H. pylori] as the cause of diseases classified elsewhere: Secondary | ICD-10-CM

## 2019-09-18 LAB — H. PYLORI BREATH TEST: H pylori Breath Test: POSITIVE — AB

## 2019-09-18 MED ORDER — AMOXICILLIN 500 MG PO TABS: ORAL_TABLET | ORAL | 0 refills | Status: DC

## 2019-09-18 MED ORDER — OMEPRAZOLE 40 MG PO CPDR: 40.0000 mg | DELAYED_RELEASE_CAPSULE | Freq: Every day | ORAL | 0 refills | Status: DC

## 2019-09-18 MED ORDER — METRONIDAZOLE 500 MG PO TABS: 500.0000 mg | ORAL_TABLET | Freq: Two times a day (BID) | ORAL | 0 refills | Status: AC

## 2019-09-18 MED FILL — OMEPRAZOLE 40 MG CPDR: 40 | 14 days supply | Qty: 14 | Fill #0

## 2019-09-18 MED FILL — AMOXICILLIN 500 MG CAPSULE: 500 | 14 days supply | Qty: 56 | Fill #0

## 2019-09-18 MED FILL — METRONIDAZOLE 500 MG TABS: 500 | 14 days supply | Qty: 28 | Fill #0

## 2019-09-30 MED FILL — GABAPENTIN 100 MG CAPSULE: 100 | 30 days supply | Qty: 90 | Fill #0

## 2019-10-06 ENCOUNTER — Other Ambulatory Visit: Payer: Self-pay | Admitting: Family Medicine

## 2019-10-06 ENCOUNTER — Encounter: Payer: Self-pay | Admitting: Family Medicine

## 2019-10-06 DIAGNOSIS — G4730 Sleep apnea, unspecified: Secondary | ICD-10-CM

## 2019-10-14 ENCOUNTER — Other Ambulatory Visit: Payer: Self-pay | Admitting: Cardiovascular Disease

## 2019-10-14 MED FILL — ATORVASTATIN 80 MG TABLET: 80 | 30 days supply | Qty: 30 | Fill #3

## 2019-10-14 MED FILL — METOPROLOL TARTRATE 25 MG T: 25 | 30 days supply | Qty: 30 | Fill #3

## 2019-10-15 ENCOUNTER — Other Ambulatory Visit: Payer: Self-pay | Admitting: Cardiovascular Disease

## 2019-10-15 MED FILL — AMLODIPINE BESYLATE 10 MG T: 10 | 15 days supply | Qty: 15 | Fill #0

## 2019-11-25 ENCOUNTER — Ambulatory Visit: Payer: BLUE CROSS/BLUE SHIELD | Admitting: Family Medicine

## 2019-11-26 ENCOUNTER — Encounter (HOSPITAL_BASED_OUTPATIENT_CLINIC_OR_DEPARTMENT_OTHER): Payer: BLUE CROSS/BLUE SHIELD | Admitting: Internal Medicine

## 2019-12-02 ENCOUNTER — Ambulatory Visit: Payer: BLUE CROSS/BLUE SHIELD | Admitting: Family Medicine

## 2019-12-16 ENCOUNTER — Other Ambulatory Visit: Payer: Self-pay | Admitting: Family Medicine

## 2019-12-16 DIAGNOSIS — E782 Mixed hyperlipidemia: Secondary | ICD-10-CM

## 2019-12-16 MED FILL — ATORVASTATIN 80 MG TABLET: 80 | 90 days supply | Qty: 90 | Fill #0

## 2019-12-16 MED FILL — AMLODIPINE BESYLATE 10 MG T: 10 | 15 days supply | Qty: 15 | Fill #1

## 2019-12-16 MED FILL — METOPROLOL TARTRATE 25 MG T: 25 | 30 days supply | Qty: 30 | Fill #4

## 2019-12-16 NOTE — Telephone Encounter (Signed)
Please see refill request.

## 2020-01-26 ENCOUNTER — Other Ambulatory Visit: Payer: Self-pay | Admitting: Cardiovascular Disease

## 2020-02-09 ENCOUNTER — Telehealth: Payer: Self-pay | Admitting: Cardiovascular Disease

## 2020-02-09 NOTE — Telephone Encounter (Signed)
*  STAT* If patient is at the pharmacy, call can be transferred to refill team.   1. Which medications need to be refilled? (please list name of each medication and dose if known) * amLODipine (NORVASC) 10 MG tablet   2. Which pharmacy/location (including street and city if local pharmacy) is medication to be sent to? [facility] - [place], [place] - [address]  3. Do they need a 30 day or 90 day supply? 90   Patients wife states that he's been out of this medication for weeks, she also states that he had to schedule an appointment in order for it to be filled. He is scheduled to see Dr. Allyson Sabal on 04/05/2020. Please advise.

## 2020-02-10 ENCOUNTER — Other Ambulatory Visit: Payer: Self-pay | Admitting: Cardiovascular Disease

## 2020-02-10 MED ORDER — AMLODIPINE BESYLATE 10 MG PO TABS: ORAL_TABLET | ORAL | 0 refills | Status: DC

## 2020-02-10 MED FILL — AMLODIPINE BESYLATE 10 MG T: 10 | 30 days supply | Qty: 30 | Fill #0

## 2020-02-23 ENCOUNTER — Other Ambulatory Visit: Payer: Self-pay | Admitting: Cardiovascular Disease

## 2020-02-23 ENCOUNTER — Other Ambulatory Visit: Payer: Self-pay

## 2020-02-23 MED ORDER — AMLODIPINE BESYLATE 10 MG PO TABS: ORAL_TABLET | ORAL | 0 refills | Status: DC

## 2020-02-23 MED FILL — METOPROLOL TARTRATE 25 MG T: 25 | 30 days supply | Qty: 30 | Fill #0

## 2020-03-04 ENCOUNTER — Ambulatory Visit (INDEPENDENT_AMBULATORY_CARE_PROVIDER_SITE_OTHER): Payer: Self-pay | Admitting: Family Medicine

## 2020-03-04 ENCOUNTER — Other Ambulatory Visit: Payer: Self-pay | Admitting: Family Medicine

## 2020-03-04 ENCOUNTER — Encounter: Payer: Self-pay | Admitting: Family Medicine

## 2020-03-04 ENCOUNTER — Other Ambulatory Visit: Payer: Self-pay

## 2020-03-04 VITALS — BP 154/74 | HR 78 | Temp 97.6°F | Resp 18 | Ht 71.5 in | Wt 230.8 lb

## 2020-03-04 DIAGNOSIS — Z09 Encounter for follow-up examination after completed treatment for conditions other than malignant neoplasm: Secondary | ICD-10-CM

## 2020-03-04 DIAGNOSIS — R350 Frequency of micturition: Secondary | ICD-10-CM

## 2020-03-04 DIAGNOSIS — R5383 Other fatigue: Secondary | ICD-10-CM

## 2020-03-04 DIAGNOSIS — M62838 Other muscle spasm: Secondary | ICD-10-CM

## 2020-03-04 DIAGNOSIS — I252 Old myocardial infarction: Secondary | ICD-10-CM

## 2020-03-04 DIAGNOSIS — I10 Essential (primary) hypertension: Secondary | ICD-10-CM

## 2020-03-04 DIAGNOSIS — R42 Dizziness and giddiness: Secondary | ICD-10-CM

## 2020-03-04 DIAGNOSIS — E782 Mixed hyperlipidemia: Secondary | ICD-10-CM

## 2020-03-04 DIAGNOSIS — G629 Polyneuropathy, unspecified: Secondary | ICD-10-CM

## 2020-03-04 MED ORDER — NITROGLYCERIN 0.4 MG SL SUBL
0.4000 mg | SUBLINGUAL_TABLET | SUBLINGUAL | 2 refills | Status: DC | PRN
Start: 2020-03-04 — End: 2020-03-04

## 2020-03-04 MED ORDER — GABAPENTIN 100 MG PO CAPS: ORAL_CAPSULE | ORAL | 11 refills | Status: DC

## 2020-03-04 MED ORDER — ATORVASTATIN CALCIUM 80 MG PO TABS: ORAL_TABLET | ORAL | 3 refills | Status: DC

## 2020-03-04 MED ORDER — CLOPIDOGREL BISULFATE 75 MG PO TABS: 75.0000 mg | ORAL_TABLET | Freq: Every day | ORAL | 3 refills | Status: DC

## 2020-03-04 MED ORDER — TAMSULOSIN HCL 0.4 MG PO CAPS: 0.4000 mg | ORAL_CAPSULE | Freq: Every day | ORAL | 3 refills | Status: DC

## 2020-03-04 MED ORDER — METOPROLOL TARTRATE 25 MG PO TABS: ORAL_TABLET | ORAL | 3 refills | Status: DC

## 2020-03-04 MED ORDER — CYCLOBENZAPRINE HCL 10 MG PO TABS: 10.0000 mg | ORAL_TABLET | Freq: Three times a day (TID) | ORAL | 6 refills | Status: DC | PRN

## 2020-03-04 MED ORDER — AMLODIPINE BESYLATE 10 MG PO TABS: ORAL_TABLET | ORAL | 3 refills | Status: DC

## 2020-03-04 MED ORDER — LOSARTAN POTASSIUM 50 MG PO TABS: 50.0000 mg | ORAL_TABLET | Freq: Every day | ORAL | 3 refills | Status: DC

## 2020-03-04 MED ORDER — GABAPENTIN 100 MG PO CAPS
100.0000 mg | ORAL_CAPSULE | Freq: Three times a day (TID) | ORAL | 11 refills | Status: DC
Start: 2020-03-04 — End: 2020-03-04

## 2020-03-04 MED FILL — CYCLOBENZAPRINE HCL 10 MG T: 10 | 30 days supply | Qty: 90 | Fill #0

## 2020-03-04 MED FILL — GABAPENTIN 100 MG CAPSULE: 100 | 60 days supply | Qty: 180 | Fill #0

## 2020-03-04 MED FILL — TAMSULOSIN HCL 0.4 MG CAP: 0.4 | 30 days supply | Qty: 30 | Fill #0

## 2020-03-04 MED FILL — LOSARTAN POTASSIUM 50 MG TA: 50 | 90 days supply | Qty: 90 | Fill #0

## 2020-03-04 MED FILL — NITROGLYCERIN 0.4 MG TAB SL: 0.4 | 5 days supply | Qty: 25 | Fill #0

## 2020-03-04 MED FILL — CLOPIDOGREL 75 MG TABLET: 75 | 90 days supply | Qty: 90 | Fill #0
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
## 2020-03-11 MED FILL — AMLODIPINE BESYLATE 10 MG T: 10 | 90 days supply | Qty: 90 | Fill #0

## 2020-04-05 ENCOUNTER — Ambulatory Visit (INDEPENDENT_AMBULATORY_CARE_PROVIDER_SITE_OTHER): Payer: Self-pay | Admitting: Cardiovascular Disease

## 2020-04-05 ENCOUNTER — Encounter: Payer: Self-pay | Admitting: Cardiovascular Disease

## 2020-04-05 ENCOUNTER — Other Ambulatory Visit: Payer: Self-pay

## 2020-04-05 ENCOUNTER — Other Ambulatory Visit: Payer: Self-pay | Admitting: Cardiovascular Disease

## 2020-04-05 VITALS — BP 156/88 | HR 63 | Ht 71.5 in | Wt 232.0 lb

## 2020-04-05 DIAGNOSIS — E782 Mixed hyperlipidemia: Secondary | ICD-10-CM

## 2020-04-05 DIAGNOSIS — I10 Essential (primary) hypertension: Secondary | ICD-10-CM

## 2020-04-05 DIAGNOSIS — I2102 ST elevation (STEMI) myocardial infarction involving left anterior descending coronary artery: Secondary | ICD-10-CM

## 2020-04-05 MED ORDER — TADALAFIL 5 MG PO TABS: 5.0000 mg | ORAL_TABLET | Freq: Every day | ORAL | 0 refills | Status: DC | PRN

## 2020-04-05 MED FILL — TADALAFIL 5 MG TABS: 5 | 30 days supply | Qty: 30 | Fill #0

## 2020-04-05 NOTE — Assessment & Plan Note (Signed)
History of hyperlipidemia on high-dose atorvastatin with lipid profile performed 09/17/2018 revealing total cholesterol 218, LDL 140 and HDL 44.  We will recheck a fasting lipid liver profile.

## 2020-04-05 NOTE — Assessment & Plan Note (Signed)
History of essential hypertension with blood pressure measured today at 156/88.  He is on amlodipine and metoprolol.

## 2020-04-05 NOTE — Assessment & Plan Note (Signed)
History of CAD status post lateral STEMI 11/25/2016.  I catheterized him radially revealing a high-grade diagonal branch stenosis which was small to medium sized.  He also had scattered to the disease otherwise.  His EF was normal.  His troponin peaked at 5 was discharged home on aspirin Brilinta ultimately transitioning to clopidogrel.  He denies chest pain or shortness of breath.

## 2020-04-05 NOTE — Patient Instructions (Signed)
Medication Instructions:   -Tadalafil (cialis) 5mg  to use as needed. YOU CAN NOT USE NITROGLYCERIN WITHIN 24 HOURS OF TAKING THIS MEDICATION.   *If you need a refill on your cardiac medications before your next appointment, please call your pharmacy*   Lab Work: Your physician recommends that you return for lab work in: next 1-2 weeks: fasting lipid/liver profile.  If you have labs (blood work) drawn today and your tests are completely normal, you will receive your results only by: MyChart Message (if you have MyChart) OR . A paper copy in the mail If you have any lab test that is abnormal or we need to change your treatment, we will call you to review the results.   Follow-Up: At [facility], you and your health needs are our priority.  As part of our continuing mission to provide you with exceptional heart care, we have created designated Provider Care Teams.  These Care Teams include your primary Cardiologist (physician) and Advanced Practice Providers (APPs -  Physician Assistants and Nurse Practitioners) who all work together to provide you with the care you need, when you need it.  We recommend signing up for the patient portal called "MyChart".  Sign up information is provided on this After Visit Summary.  MyChart is used to connect with patients for Virtual Visits (Telemedicine).  Patients are able to view lab/test results, encounter notes, upcoming appointments, etc.  Non-urgent messages can be sent to your provider as well.   To learn more about what you can do with MyChart, go to [facility].    Your next appointment:   12 month(s)  The format for your next appointment:   In Person  Provider:   [email or web address], MD

## 2020-04-05 NOTE — Progress Notes (Signed)
04/05/2020 Vincent [name]   1955-04-05  [id number]  Primary Physician Kallie Locks, FNP Primary Cardiologist: Runell Gess MD Nicholes Calamity, [place]  HPI:  Vincent [name] is a 65 y.o.  mildly overweight although fit appearing married African-American male father of 4 children who currently is unemployed but previously was working with his fiancs and now wife at the [employer] and [employer].  I last saw him for a virtual telemedicine video visit 04/30/2018. He presented on 11/25/16 with chest pain and lateral ST segment elevation. I catheterized him radially revealing high-grade diagonal branch stenosis and a small to medium-sized diagonal branch. He also had some scattered noncritical disease otherwise with a normal EF. His troponin peaked at 5 and he was discharged home on aspirin and Brilenta, and has since transitioned to clopidogrel.  His other problems include treated hypertension and hyperlipidemia. He does not smoke.  Since I saw him virtually 2 years ago he has remained stable.  He denies chest pain or shortness of breath.  He remains on dual antiplatelet therapy.  He does admit to medication noncompliance and stopped taking several his medications which have been reinstituted by his PCP.  Current Meds  Medication Sig  . amLODipine (NORVASC) 10 MG tablet TAKE 1 TABLET (10 MG TOTAL) BY MOUTH DAILY  . aspirin 81 MG chewable tablet Chew 1 tablet (81 mg total) by mouth daily.  Marland Kitchen atorvastatin (LIPITOR) 80 MG tablet TAKE 1 TABLET BY MOUTH DAILY AT 6 PM  . clopidogrel (PLAVIX) 75 MG tablet Take 1 tablet (75 mg total) by mouth daily.  . cyclobenzaprine (FLEXERIL) 10 MG tablet Take 1 tablet (10 mg total) by mouth 3 (three) times daily as needed for muscle spasms.  Marland Kitchen gabapentin (NEURONTIN) 100 MG capsule Take 2 capsules (200 mg), by mouth, 3 times a day.  . losartan (COZAAR) 50 MG tablet Take 1 tablet (50 mg total) by mouth daily.  . meclizine (ANTIVERT) 25 MG  tablet Take 1 tablet (25 mg total) by mouth 3 (three) times daily as needed for dizziness.  . metoprolol tartrate (LOPRESSOR) 25 MG tablet TAKE 1/2 TABLETS (12.5 MG TOTAL) BY MOUTH 2 TIMES DAILY.  . nitroGLYCERIN (NITROSTAT) 0.4 MG SL tablet Place 1 tablet (0.4 mg total) under the tongue every 5 (five) minutes as needed.  . tadalafil (CIALIS) 5 MG tablet Take 1 tablet (5 mg total) by mouth daily as needed for erectile dysfunction.  . tamsulosin (FLOMAX) 0.4 MG CAPS capsule Take 1 capsule (0.4 mg total) by mouth daily.     No Known Allergies  Social History   Socioeconomic History  . Marital status: Married    Spouse name: Not on file  . Number of children: Not on file  . Years of education: Not on file  . Highest education level: Not on file  Occupational History  . Occupation: Patient assistance    Employer: Theatre stage manager [employer]  Tobacco Use  . Smoking status: Never Smoker  . Smokeless tobacco: Never Used  Vaping Use  . Vaping Use: Never used  Substance and Sexual Activity  . Alcohol use: Yes    Comment: occ  . Drug use: No  . Sexual activity: Yes  Other Topics Concern  . Not on file  Social History Narrative  . Not on file   Social Determinants of Health   Financial Resource Strain: Not on file  Food Insecurity: Not on file  Transportation Needs: Not on  file  Physical Activity: Not on file  Stress: Not on file  Social Connections: Not on file  Intimate Partner Violence: Not on file     Review of Systems: General: negative for chills, fever, night sweats or weight changes.  Cardiovascular: negative for chest pain, dyspnea on exertion, edema, orthopnea, palpitations, paroxysmal nocturnal dyspnea or shortness of breath Dermatological: negative for rash Respiratory: negative for cough or wheezing Urologic: negative for hematuria Abdominal: negative for nausea, vomiting, diarrhea, bright red blood per rectum, melena, or hematemesis Neurologic:  negative for visual changes, syncope, or dizziness All other systems reviewed and are otherwise negative except as noted above.    Blood pressure (!) 156/88, pulse 63, height 5' 11.5" (1.816 m), weight 232 lb (105.2 kg), SpO2 99 %.  General appearance: alert and no distress Neck: no adenopathy, no carotid bruit, no JVD, supple, symmetrical, trachea midline and thyroid not enlarged, symmetric, no tenderness/mass/nodules Lungs: clear to auscultation bilaterally Heart: regular rate and rhythm, S1, S2 normal, no murmur, click, rub or gallop Extremities: extremities normal, atraumatic, no cyanosis or edema Pulses: 2+ and symmetric Skin: Skin color, texture, turgor normal. No rashes or lesions Neurologic: Alert and oriented X 3, normal strength and tone. Normal symmetric reflexes. Normal coordination and gait  EKG sinus rhythm at 63 with nonspecific ST-T wave changes and LVH voltage.  I personally reviewed this EKG.  ASSESSMENT AND PLAN:   ST elevation myocardial infarction (STEMI) (HCC) History of CAD status post lateral STEMI 11/25/2016.  I catheterized him radially revealing a high-grade diagonal branch stenosis which was small to medium sized.  He also had scattered to the disease otherwise.  His EF was normal.  His troponin peaked at 5 was discharged home on aspirin Brilinta ultimately transitioning to clopidogrel.  He denies chest pain or shortness of breath.  Essential hypertension History of essential hypertension with blood pressure measured today at 156/88.  He is on amlodipine and metoprolol.  Hyperlipidemia History of hyperlipidemia on high-dose atorvastatin with lipid profile performed 09/17/2018 revealing total cholesterol 218, LDL 140 and HDL 44.  We will recheck a fasting lipid liver profile.      Runell Gess MD FACP,FACC,FAHA, [facility] 04/05/2020 9:03 AM

## 2020-04-09 ENCOUNTER — Other Ambulatory Visit (HOSPITAL_BASED_OUTPATIENT_CLINIC_OR_DEPARTMENT_OTHER): Payer: Self-pay

## 2020-04-15 ENCOUNTER — Other Ambulatory Visit: Payer: Self-pay

## 2020-04-15 ENCOUNTER — Emergency Department (HOSPITAL_BASED_OUTPATIENT_CLINIC_OR_DEPARTMENT_OTHER)
Admission: EM | Admit: 2020-04-15 | Discharge: 2020-04-16 | Disposition: A | Discharge status: Home / Self Care | Payer: Self-pay | Attending: Emergency Medicine | Admitting: Emergency Medicine

## 2020-04-15 ENCOUNTER — Encounter (HOSPITAL_BASED_OUTPATIENT_CLINIC_OR_DEPARTMENT_OTHER): Payer: Self-pay

## 2020-04-15 DIAGNOSIS — I10 Essential (primary) hypertension: Secondary | ICD-10-CM | POA: Insufficient documentation

## 2020-04-15 DIAGNOSIS — Z8616 Personal history of COVID-19: Secondary | ICD-10-CM | POA: Insufficient documentation

## 2020-04-15 DIAGNOSIS — Z79899 Other long term (current) drug therapy: Secondary | ICD-10-CM | POA: Insufficient documentation

## 2020-04-15 DIAGNOSIS — M79662 Pain in left lower leg: Secondary | ICD-10-CM | POA: Insufficient documentation

## 2020-04-15 DIAGNOSIS — Z7902 Long term (current) use of antithrombotics/antiplatelets: Secondary | ICD-10-CM | POA: Insufficient documentation

## 2020-04-15 DIAGNOSIS — Z7982 Long term (current) use of aspirin: Secondary | ICD-10-CM | POA: Insufficient documentation

## 2020-04-15 LAB — CBC WITH DIFFERENTIAL/PLATELET
Abs Immature Granulocytes: 0.01 10*3/uL (ref 0.00–0.07)
Basophils Absolute: 0 10*3/uL (ref 0.0–0.1)
Basophils Relative: 0 %
Eosinophils Absolute: 0.3 10*3/uL (ref 0.0–0.5)
Eosinophils Relative: 6 %
HCT: 42.7 % (ref 39.0–52.0)
Hemoglobin: 14.2 g/dL (ref 13.0–17.0)
Immature Granulocytes: 0 %
Lymphocytes Relative: 40 %
Lymphs Abs: 2.2 10*3/uL (ref 0.7–4.0)
MCH: 28 pg (ref 26.0–34.0)
MCHC: 33.3 g/dL (ref 30.0–36.0)
MCV: 84.2 fL (ref 80.0–100.0)
Monocytes Absolute: 0.6 10*3/uL (ref 0.1–1.0)
Monocytes Relative: 12 %
Neutro Abs: 2.3 10*3/uL (ref 1.7–7.7)
Neutrophils Relative %: 42 %
Platelets: 150 10*3/uL (ref 150–400)
RBC: 5.07 MIL/uL (ref 4.22–5.81)
RDW: 13.4 % (ref 11.5–15.5)
WBC: 5.4 10*3/uL (ref 4.0–10.5)
nRBC: 0 % (ref 0.0–0.2)

## 2020-04-15 LAB — BASIC METABOLIC PANEL
Anion gap: 9 (ref 5–15)
BUN: 18 mg/dL (ref 8–23)
CO2: 28 mmol/L (ref 22–32)
Calcium: 9 mg/dL (ref 8.9–10.3)
Chloride: 102 mmol/L (ref 98–111)
Creatinine, Ser: 1.07 mg/dL (ref 0.61–1.24)
GFR, Estimated: >60 mL/min (ref >60)
Glucose, Bld: 149 mg/dL — ABNORMAL HIGH (ref 70–99)
Potassium: 3.5 mmol/L (ref 3.5–5.1)
Sodium: 139 mmol/L (ref 135–145)

## 2020-04-15 NOTE — ED Notes (Addendum)
Palumbo MD  Provider at bedside. For MSE eval . Verbal orders placed per MD order,

## 2020-04-15 NOTE — ED Triage Notes (Addendum)
Pt c/o pain to left LE/calf x 3 days "cramps"-denies injury-NAD-steady gait-pt added c/o cramps to left hand x "months"

## 2020-04-16 ENCOUNTER — Encounter (HOSPITAL_BASED_OUTPATIENT_CLINIC_OR_DEPARTMENT_OTHER): Payer: Self-pay | Admitting: Emergency Medicine

## 2020-04-16 ENCOUNTER — Ambulatory Visit (HOSPITAL_BASED_OUTPATIENT_CLINIC_OR_DEPARTMENT_OTHER)
Admit: 2020-04-16 | Discharge: 2020-04-16 | Disposition: A | Discharge status: Home / Self Care | Payer: Self-pay | Attending: Emergency Medicine | Admitting: Emergency Medicine

## 2020-04-16 ENCOUNTER — Emergency Department (HOSPITAL_BASED_OUTPATIENT_CLINIC_OR_DEPARTMENT_OTHER): Payer: Self-pay

## 2020-04-16 LAB — URINALYSIS, ROUTINE W REFLEX MICROSCOPIC
Bilirubin Urine: NEGATIVE
Glucose, UA: 100 mg/dL — AB
Hgb urine dipstick: NEGATIVE
Ketones, ur: NEGATIVE mg/dL
Leukocytes,Ua: NEGATIVE
Nitrite: NEGATIVE
Protein, ur: NEGATIVE mg/dL
Specific Gravity, Urine: <1.005 — ABNORMAL LOW (ref 1.005–1.030)
pH: 7 (ref 5.0–8.0)

## 2020-04-16 MED ORDER — IOHEXOL 350 MG/ML SOLN
100.0000 mL | Freq: Once | INTRAVENOUS | Status: AC | PRN
  Administered 2020-04-16: 100 mL via INTRAVENOUS

## 2020-04-16 NOTE — ED Notes (Signed)
Pt reports urinary frequency and hesitation. UA ordered per verbal order.
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
## 2020-04-16 NOTE — ED Provider Notes (Signed)
Patient seen overnight and returns for doppler left LE and here to obtain results.   Doppler negative. The patient reports uncontrolled pain with Tylenol. Appears uncomfortable. Database review with score of 110, last opioid Rx over one year ago.   Will prescribe #10 Percocet for pain. Recommended follow up with Dr. Pearletha Forge for further evaluation of left leg pain.    Elpidio Anis, PA-C 04/16/20 1328    Melene Plan, DO 04/16/20 1428

## 2020-04-21 ENCOUNTER — Other Ambulatory Visit (HOSPITAL_BASED_OUTPATIENT_CLINIC_OR_DEPARTMENT_OTHER): Payer: Self-pay

## 2020-04-21 ENCOUNTER — Ambulatory Visit (INDEPENDENT_AMBULATORY_CARE_PROVIDER_SITE_OTHER): Payer: Self-pay | Admitting: Family Medicine

## 2020-04-21 ENCOUNTER — Ambulatory Visit: Payer: Self-pay

## 2020-04-21 ENCOUNTER — Other Ambulatory Visit: Payer: Self-pay

## 2020-04-21 VITALS — BP 138/80 | Ht 71.5 in | Wt 230.0 lb

## 2020-04-21 DIAGNOSIS — M5416 Radiculopathy, lumbar region: Secondary | ICD-10-CM | POA: Insufficient documentation

## 2020-04-21 DIAGNOSIS — M79605 Pain in left leg: Secondary | ICD-10-CM

## 2020-04-21 HISTORY — DX: Radiculopathy, lumbar region: M54.16

## 2020-04-21 MED ORDER — PREDNISONE 5 MG PO TABS
ORAL_TABLET | ORAL | 0 refills | Status: DC
  Filled 2020-04-21: qty 21, 6d supply, fill #0

## 2020-04-21 NOTE — Assessment & Plan Note (Signed)
Pain seems more associated with radicular component.  He does have a fair amount of degenerative disc disease at L5-S1.  CT scan was reassuring and no signs of DVT on Doppler. -Counseled on home exercise therapy and supportive care. -Prednisone. -Could consider MRI for epidural.

## 2020-04-21 NOTE — Patient Instructions (Signed)
Nice to meet you Please try the medicine. Please do not take with ibuprofen or excedrin  Please try the exercises   Please send me a message in MyChart with any questions or updates.  Please see me back in 2-3 weeks.   --Dr. Jordan Likes
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
## 2020-05-07 LAB — HEPATIC FUNCTION PANEL
ALT: 26 IU/L (ref 0–44)
AST: 22 IU/L (ref 0–40)
Albumin: 4.1 g/dL (ref 3.8–4.8)
Alkaline Phosphatase: 74 IU/L (ref 44–121)
Bilirubin Total: 0.5 mg/dL (ref 0.0–1.2)
Bilirubin, Direct: 0.16 mg/dL (ref 0.00–0.40)
Total Protein: 6.7 g/dL (ref 6.0–8.5)

## 2020-05-07 LAB — LIPID PANEL
Chol/HDL Ratio: 2.5 ratio (ref 0.0–5.0)
Cholesterol, Total: 129 mg/dL (ref 100–199)
HDL: 51 mg/dL (ref >39)
LDL Chol Calc (NIH): 64 mg/dL (ref 0–99)
Triglycerides: 67 mg/dL (ref 0–149)
VLDL Cholesterol Cal: 14 mg/dL (ref 5–40)

## 2020-05-09 ENCOUNTER — Ambulatory Visit: Payer: Self-pay | Admitting: Family Medicine

## 2020-05-10 ENCOUNTER — Telehealth: Payer: Self-pay | Admitting: Family Medicine

## 2020-05-16 ENCOUNTER — Ambulatory Visit: Payer: BLUE CROSS/BLUE SHIELD | Admitting: Family Medicine

## 2020-05-16 ENCOUNTER — Ambulatory Visit: Payer: Self-pay | Admitting: Nurse Practitioner

## 2020-05-24 ENCOUNTER — Telehealth: Payer: Self-pay | Admitting: Family Medicine

## 2020-05-24 NOTE — Telephone Encounter (Signed)
Patient states that that she would like her husband to become a new patient. He is the brother of Cornelius Moras and uncle of Jenelle Mages. 2 current patient family member. He was seeing Dr. Bradly Chris but she is no longer with [facility]. Please advise Wife's CB #  Is [phone] [place]

## 2020-05-24 NOTE — Telephone Encounter (Signed)
OK 

## 2020-06-03 ENCOUNTER — Other Ambulatory Visit: Payer: Self-pay

## 2020-06-03 ENCOUNTER — Ambulatory Visit (INDEPENDENT_AMBULATORY_CARE_PROVIDER_SITE_OTHER): Payer: Medicare Other | Admitting: Family Medicine

## 2020-06-03 ENCOUNTER — Encounter: Payer: Self-pay | Admitting: Family Medicine

## 2020-06-03 ENCOUNTER — Other Ambulatory Visit (HOSPITAL_BASED_OUTPATIENT_CLINIC_OR_DEPARTMENT_OTHER): Payer: Self-pay

## 2020-06-03 VITALS — BP 148/88 | HR 63 | Temp 98.1°F | Ht 71.5 in | Wt 228.0 lb

## 2020-06-03 DIAGNOSIS — R7303 Prediabetes: Secondary | ICD-10-CM

## 2020-06-03 DIAGNOSIS — S76302A Unspecified injury of muscle, fascia and tendon of the posterior muscle group at thigh level, left thigh, initial encounter: Secondary | ICD-10-CM | POA: Diagnosis not present

## 2020-06-03 DIAGNOSIS — R350 Frequency of micturition: Secondary | ICD-10-CM

## 2020-06-03 DIAGNOSIS — M79642 Pain in left hand: Secondary | ICD-10-CM

## 2020-06-03 HISTORY — DX: Prediabetes: R73.03

## 2020-06-03 MED ORDER — MELOXICAM 15 MG PO TABS
15.0000 mg | ORAL_TABLET | Freq: Every day | ORAL | 0 refills | Status: DC
  Filled 2020-06-03: qty 30, 30d supply, fill #0

## 2020-06-03 NOTE — Patient Instructions (Addendum)
Give [place] 2-3 business days to get the results of your labs back. If labs are normal, I will send in a medicine for the prostate.   Stay hydrated.  Aim to do some physical exertion for 150 minutes per week. This is typically divided into 5 days per week, 30 minutes per day. The activity should be enough to get your heart rate up. Anything is better than nothing if you have time constraints.  Ice/cold pack over area for 10-15 min twice daily.  OK to take Tylenol 1000 mg (2 extra strength tabs) or 975 mg (3 regular strength tabs) every 6 hours as needed.  Let us know if you need anything.  Hand Exercises Hand exercises can be helpful for almost anyone. These exercises can strengthen the hands, improve flexibility and movement, and increase blood flow to the hands. These results can make work and daily tasks easier. Hand exercises can be especially helpful for people who have joint pain from arthritis or have nerve damage from overuse (carpal tunnel syndrome). These exercises can also help people who have injured a hand. Exercises Most of these hand exercises are gentle stretching and motion exercises. It is usually safe to do them often throughout the day. Warming up your hands before exercise may help to reduce stiffness. You can do this with gentle massage or by placing your hands in warm water for 10-15 minutes. It is normal to feel some stretching, pulling, tightness, or mild discomfort as you begin new exercises. This will gradually improve. Stop an exercise right away if you feel sudden, severe pain or your pain gets worse. Ask your health care provider which exercises are best for you. Knuckle bend or "claw" fist 1. Stand or sit with your arm, hand, and all five fingers pointed straight up. Make sure to keep your wrist straight during the exercise. 2. Gently bend your fingers down toward your palm until the tips of your fingers are touching the top of your palm. Keep your big knuckle straight  and just bend the small knuckles in your fingers. 3. Hold this position for 3 seconds. 4. Straighten (extend) your fingers back to the starting position. Repeat this exercise 5-10 times with each hand. Full finger fist 1. Stand or sit with your arm, hand, and all five fingers pointed straight up. Make sure to keep your wrist straight during the exercise. 2. Gently bend your fingers into your palm until the tips of your fingers are touching the middle of your palm. 3. Hold this position for 3 seconds. 4. Extend your fingers back to the starting position, stretching every joint fully. Repeat this exercise 5-10 times with each hand. Straight fist 1. Stand or sit with your arm, hand, and all five fingers pointed straight up. Make sure to keep your wrist straight during the exercise. 2. Gently bend your fingers at the big knuckle, where your fingers meet your hand, and the middle knuckle. Keep the knuckle at the tips of your fingers straight and try to touch the bottom of your palm. 3. Hold this position for 3 seconds. 4. Extend your fingers back to the starting position, stretching every joint fully. Repeat this exercise 5-10 times with each hand. Tabletop 1. Stand or sit with your arm, hand, and all five fingers pointed straight up. Make sure to keep your wrist straight during the exercise. 2. Gently bend your fingers at the big knuckle, where your fingers meet your hand, as far down as you can while keeping the  small knuckles in your fingers straight. Think of forming a tabletop with your fingers. 3. Hold this position for 3 seconds. 4. Extend your fingers back to the starting position, stretching every joint fully. Repeat this exercise 5-10 times with each hand. Finger spread 1. Place your hand flat on a table with your palm facing down. Make sure your wrist stays straight as you do this exercise. 2. Spread your fingers and thumb apart from each other as far as you can until you feel a gentle  stretch. Hold this position for 3 seconds. 3. Bring your fingers and thumb tight together again. Hold this position for 3 seconds. Repeat this exercise 5-10 times with each hand. Making circles 1. Stand or sit with your arm, hand, and all five fingers pointed straight up. Make sure to keep your wrist straight during the exercise. 2. Make a circle by touching the tip of your thumb to the tip of your index finger. 3. Hold for 3 seconds. Then open your hand wide. 4. Repeat this motion with your thumb and each finger on your hand. Repeat this exercise 5-10 times with each hand. Thumb motion 1. Sit with your forearm resting on a table and your wrist straight. Your thumb should be facing up toward the ceiling. Keep your fingers relaxed as you move your thumb. 2. Lift your thumb up as high as you can toward the ceiling. Hold for 3 seconds. 3. Bend your thumb across your palm as far as you can, reaching the tip of your thumb for the small finger (pinkie) side of your palm. Hold for 3 seconds. Repeat this exercise 5-10 times with each hand. Grip strengthening  1. Hold a stress ball or other soft ball in the middle of your hand. 2. Slowly increase the pressure, squeezing the ball as much as you can without causing pain. Think of bringing the tips of your fingers into the middle of your palm. All of your finger joints should bend when doing this exercise. 3. Hold your squeeze for 3 seconds, then relax. Repeat this exercise 5-10 times with each hand. Contact a health care provider if:  Your hand pain or discomfort gets much worse when you do an exercise.  Your hand pain or discomfort does not improve within 2 hours after you exercise. If you have any of these problems, stop doing these exercises right away. Do not do them again unless your health care provider says that you can. Get help right away if:  You develop sudden, severe hand pain or swelling. If this happens, stop doing these exercises  right away. Do not do them again unless your health care provider says that you can. Make sure you discuss any questions you have with your health care provider. Document Revised: 04/17/2018 Document Reviewed: 12/26/2017 Elsevier Patient Education  2020 Elsevier Inc.  Semimembranosus Tendinitis Rehab  It is normal to feel mild stretching, pulling, tightness, or discomfort as you do these exercises, but you should stop right away if you feel sudden pain or your pain gets worse.  Stretching and range of motion exercises These exercises warm up your muscles and joints and improve the movement and flexibility of your thigh. These exercises also help to relieve pain, numbness, and tingling. Exercise A: Hamstring stretch, supine    1. Lie on your back. Loop a belt or towel across the ball of your left / right foot The ball of your foot is on the walking surface, right under your toes. 2. Straighten  your left / right knee and slowly pull on the belt to raise your leg. Stop when you feel a gentle stretch behind your left / right knee or thigh. ? Do not allow the knee to bend. ? Keep your other leg flat on the floor. 3. Hold this position for 30 seconds. Repeat 2 times. Complete this exercise 3 times a week. Strengthening exercises These exercises build strength and endurance in your thigh. Endurance is the ability to use your muscles for a long time, even after they get tired. Exercise B: Straight leg raises (hip extensors) 5. Lie on your belly on a bed or a firm surface with a pillow under your hips. 6. Bend your left / right knee so your foot is straight up in the air. 7. Squeeze your buttock muscles and lift your left / right thigh off the bed. Do not let your back arch. 8. Hold this position for 3 seconds. 9. Slowly return to the starting position. Let your muscles relax completely before you do another repetition. Repeat 2 times. Complete this exercise 3 times a week. Exercise C: Bridge (hip  extensors)     1. Lie on your back on a firm surface with your knees bent and your feet flat on the floor. 2. Tighten your buttocks muscles and lift your bottom off the floor until your trunk is level with your thighs. ? You should feel the muscles working in your buttocks and the back of your thighs. If you do not feel these muscles, slide your feet 1-2 inches (2.5-5 cm) farther away from your buttocks. ? Do not arch your back. 3. Hold this position for 3 seconds. 4. Slowly lower your hips to the starting position. 5. Let your buttocks muscles relax completely between repetitions. If this exercise is too easy, try doing it with your arms crossed over your chest. Repeat 2 times. Complete this exercise 3 times a week. Exercise D: Hamstring eccentric, prone 5. Lie on your belly on a bed or on the floor. 6. Start with your legs straight. Cross your legs at the ankles with your left / right leg on top. 7. Using your bottom leg to do the work, bend both knees. 8. Using just your left / right leg alone, slowly lower your leg back down toward the bed. Add a 5 lb weight as told by your health care provider. 9. Let your muscles relax completely between repetitions. Repeat 2 times. Complete this exercise 3 times a week. Exercise E: Squats 1. Stand in front of a table, with your feet and knees pointing straight ahead. You may rest your hands on the table for balance but not for support. 2. Slowly bend your knees and lower your hips like you are going to sit in a chair. Keep your thighs straight or pointed slightly outward. ? Keep your weight over your heels, not over your toes. ? Keep your lower legs upright so they are parallel with the table legs. ? Do not let your hips go lower than your knees. Stop when your knees are bent to the shape of an upside-down letter "L" (90 degree angle). ? Do not bend lower than told by your health care provider. ? If your knee pain increases, do not bend as  low. 3. Hold the squat position 1-2 seconds. 4. Slowly push with your legs to return to standing. Do not use your hands to pull yourself to standing. Repeat 2 times. Complete this exercise 3 times a week. Make  sure you discuss any questions you have with your health care provider. Document Released: 12/25/2004 Document Revised: 09/01/2015 Document Reviewed: 09/28/2014 Elsevier Interactive Patient Education  [employer]2018 Elsevier Inc.
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
## 2020-06-04 LAB — HEMOGLOBIN A1C
Hgb A1c MFr Bld: 6.7 % of total Hgb — ABNORMAL HIGH (ref <5.7)
Mean Plasma Glucose: 146 mg/dL
eAG (mmol/L): 8.1 mmol/L

## 2020-06-29 ENCOUNTER — Other Ambulatory Visit: Payer: Self-pay | Admitting: Family Medicine

## 2020-06-29 ENCOUNTER — Other Ambulatory Visit (HOSPITAL_BASED_OUTPATIENT_CLINIC_OR_DEPARTMENT_OTHER): Payer: Self-pay

## 2020-06-29 MED ORDER — MELOXICAM 15 MG PO TABS
15.0000 mg | ORAL_TABLET | Freq: Every day | ORAL | 0 refills | Status: DC
  Filled 2020-06-29: qty 30, 30d supply, fill #0

## 2020-06-29 MED FILL — Clopidogrel Bisulfate Tab 75 MG (Base Equiv): ORAL | 90 days supply | Qty: 90 | Fill #0 | Status: AC

## 2020-06-29 MED FILL — Amlodipine Besylate Tab 10 MG (Base Equivalent): ORAL | 90 days supply | Qty: 90 | Fill #0 | Status: AC

## 2020-06-29 MED FILL — Metoprolol Tartrate Tab 25 MG: ORAL | 90 days supply | Qty: 90 | Fill #0 | Status: AC

## 2020-07-04 ENCOUNTER — Other Ambulatory Visit (HOSPITAL_BASED_OUTPATIENT_CLINIC_OR_DEPARTMENT_OTHER): Payer: Self-pay

## 2020-07-04 ENCOUNTER — Encounter: Payer: Self-pay | Admitting: Family Medicine

## 2020-07-04 ENCOUNTER — Other Ambulatory Visit: Payer: Self-pay

## 2020-07-04 ENCOUNTER — Ambulatory Visit (INDEPENDENT_AMBULATORY_CARE_PROVIDER_SITE_OTHER): Payer: Medicare Other | Admitting: Family Medicine

## 2020-07-04 VITALS — BP 138/82 | HR 82 | Temp 98.0°F | Ht 71.5 in | Wt 228.1 lb

## 2020-07-04 DIAGNOSIS — Z23 Encounter for immunization: Secondary | ICD-10-CM | POA: Diagnosis not present

## 2020-07-04 DIAGNOSIS — I10 Essential (primary) hypertension: Secondary | ICD-10-CM | POA: Diagnosis not present

## 2020-07-04 DIAGNOSIS — E782 Mixed hyperlipidemia: Secondary | ICD-10-CM

## 2020-07-04 DIAGNOSIS — E669 Obesity, unspecified: Secondary | ICD-10-CM

## 2020-07-04 DIAGNOSIS — E1169 Type 2 diabetes mellitus with other specified complication: Secondary | ICD-10-CM

## 2020-07-04 DIAGNOSIS — B353 Tinea pedis: Secondary | ICD-10-CM

## 2020-07-04 HISTORY — DX: Type 2 diabetes mellitus with other specified complication: E11.69

## 2020-07-04 HISTORY — DX: Type 2 diabetes mellitus with other specified complication: E66.9

## 2020-07-04 MED ORDER — ATORVASTATIN CALCIUM 80 MG PO TABS
ORAL_TABLET | ORAL | 3 refills | Status: DC
  Filled 2020-07-04: qty 90, 90d supply, fill #0

## 2020-07-04 MED ORDER — OXYBUTYNIN CHLORIDE ER 5 MG PO TB24
5.0000 mg | ORAL_TABLET | Freq: Every day | ORAL | 2 refills | Status: DC
Start: 2020-07-04 — End: 2020-10-18
  Filled 2020-07-04: qty 30, 30d supply, fill #0

## 2020-07-04 MED ORDER — KETOCONAZOLE 2 % EX CREA
1.0000 "application " | TOPICAL_CREAM | Freq: Every day | CUTANEOUS | 0 refills | Status: AC
  Filled 2020-07-04: qty 60, 30d supply, fill #0

## 2020-07-04 MED ORDER — LOSARTAN POTASSIUM 50 MG PO TABS
50.0000 mg | ORAL_TABLET | Freq: Every day | ORAL | 3 refills | Status: DC
  Filled 2020-07-04: qty 90, 90d supply, fill #0
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
## 2020-07-04 NOTE — Patient Instructions (Addendum)
If you do not hear anything about your referral in the next 1-2 weeks, call our office and ask for an update.  The new Shingrix vaccine (for shingles) is a 2 shot series. It can make people feel low energy, achy and almost like they have the flu for 48 hours after injection. Please plan accordingly when deciding on when to get this shot. Call your pharmacy for an appointment to get this. The second shot of the series is less severe regarding the side effects, but it still lasts 48 hours.   Keep the diet clean and stay active.  Let us know if you need anything.  Healthy Eating Plan Many factors influence your heart health, including eating and exercise habits. Heart (coronary) risk increases with abnormal blood fat (lipid) levels. Heart-healthy meal planning includes limiting unhealthy fats, increasing healthy fats, and making other small dietary changes. This includes maintaining a healthy body weight to help keep lipid levels within a normal range.  WHAT IS MY PLAN?  Your health care provider recommends that you: Drink a glass of water before meals to help with satiety. Eat slowly. An alternative to the water is to add Metamucil. This will help with satiety as well. It does contain calories, unlike water.  WHAT TYPES OF FAT SHOULD I CHOOSE? Choose healthy fats more often. Choose monounsaturated and polyunsaturated fats, such as olive oil and canola oil, flaxseeds, walnuts, almonds, and seeds. Eat more omega-3 fats. Good choices include salmon, mackerel, sardines, tuna, flaxseed oil, and ground flaxseeds. Aim to eat fish at least two times each week. Avoid foods with partially hydrogenated oils in them. These contain trans fats. Examples of foods that contain trans fats are stick margarine, some tub margarines, cookies, crackers, and other baked goods. If you are going to avoid a fat, this is the one to avoid!  WHAT GENERAL GUIDELINES DO I NEED TO FOLLOW? Check food labels carefully to  identify foods with trans fats. Avoid these types of options when possible. Fill one half of your plate with vegetables and green salads. Eat 4-5 servings of vegetables per day. A serving of vegetables equals 1 cup of raw leafy vegetables,  cup of raw or cooked cut-up vegetables, or  cup of vegetable juice. Fill one fourth of your plate with whole grains. Look for the word "whole" as the first word in the ingredient list. Fill one fourth of your plate with lean protein foods. Eat 4-5 servings of fruit per day. A serving of fruit equals one medium whole fruit,  cup of dried fruit,  cup of fresh, frozen, or canned fruit. Try to avoid fruits in cups/syrups as the sugar content can be high. Eat more foods that contain soluble fiber. Examples of foods that contain this type of fiber are apples, broccoli, carrots, beans, peas, and barley. Aim to get 20-30 g of fiber per day. Eat more home-cooked food and less restaurant, buffet, and fast food. Limit or avoid alcohol. Limit foods that are high in starch and sugar. Avoid fried foods when able. Cook foods by using methods other than frying. Baking, boiling, grilling, and broiling are all great options. Other fat-reducing suggestions include: Removing the skin from poultry. Removing all visible fats from meats. Skimming the fat off of stews, soups, and gravies before serving them. Steaming vegetables in water or broth. Lose weight if you are overweight. Losing just 5-10% of your initial body weight can help your overall health and prevent diseases such as diabetes and heart disease.  Increase your consumption of nuts, legumes, and seeds to 4-5 servings per week. One serving of dried beans or legumes equals  cup after being cooked, one serving of nuts equals 1 ounces, and one serving of seeds equals  ounce or 1 tablespoon.  WHAT ARE GOOD FOODS CAN I EAT? Grains Grainy breads (try to find bread that is 3 g of fiber per slice or greater), oatmeal, light  popcorn. Whole-grain cereals. Rice and pasta, including brown rice and those that are made with whole wheat. Edamame pasta is a great alternative to grain pasta. It has a higher protein content. Try to avoid significant consumption of white bread, sugary cereals, or pastries/baked goods.  Vegetables All vegetables. Cooked white potatoes do not count as vegetables.  Fruits All fruits, but limit pineapple and bananas as these fruits have a higher sugar content.  Meats and Other Protein Sources Lean, well-trimmed beef, veal, pork, and lamb. Chicken and [place] without skin. All fish and shellfish. Wild duck, rabbit, pheasant, and venison. Egg whites or low-cholesterol egg substitutes. Dried beans, peas, lentils, and tofu. Seeds and most nuts.  Dairy Low-fat or nonfat cheeses, including ricotta, string, and mozzarella. Skim or 1% milk that is liquid, powdered, or evaporated. Buttermilk that is made with low-fat milk. Nonfat or low-fat yogurt. Soy/Almond milk are good alternatives if you cannot handle dairy.  Beverages Water is the best for you. Sports drinks with less sugar are more desirable unless you are a highly active athlete.  Sweets and Desserts Sherbets and fruit ices. Honey, jam, marmalade, jelly, and syrups. Dark chocolate.  Eat all sweets and desserts in moderation.  Fats and Oils Nonhydrogenated (trans-free) margarines. Vegetable oils, including soybean, sesame, sunflower, olive, peanut, safflower, corn, canola, and cottonseed. Salad dressings or mayonnaise that are made with a vegetable oil. Limit added fats and oils that you use for cooking, baking, salads, and as spreads.  Other Cocoa powder. Coffee and tea. Most condiments.  The items listed above may not be a complete list of recommended foods or beverages. Contact your dietitian for more options.

## 2020-07-05 ENCOUNTER — Other Ambulatory Visit (HOSPITAL_BASED_OUTPATIENT_CLINIC_OR_DEPARTMENT_OTHER): Payer: Self-pay

## 2020-07-05 LAB — MICROALBUMIN / CREATININE URINE RATIO
Creatinine,U: 344.1 mg/dL
Microalb Creat Ratio: 2.5 mg/g (ref 0.0–30.0)
Microalb, Ur: 8.5 mg/dL — ABNORMAL HIGH (ref 0.0–1.9)

## 2020-07-20 ENCOUNTER — Telehealth: Payer: Self-pay | Admitting: Family Medicine

## 2020-07-20 ENCOUNTER — Other Ambulatory Visit (HOSPITAL_BASED_OUTPATIENT_CLINIC_OR_DEPARTMENT_OTHER): Payer: Self-pay

## 2020-07-20 ENCOUNTER — Encounter: Payer: Self-pay | Admitting: Family Medicine

## 2020-07-20 ENCOUNTER — Ambulatory Visit (INDEPENDENT_AMBULATORY_CARE_PROVIDER_SITE_OTHER): Payer: Medicare Other | Admitting: Family Medicine

## 2020-07-20 ENCOUNTER — Other Ambulatory Visit: Payer: Self-pay

## 2020-07-20 VITALS — BP 132/86 | HR 82 | Temp 98.3°F | Ht 71.5 in | Wt 229.4 lb

## 2020-07-20 DIAGNOSIS — R079 Chest pain, unspecified: Secondary | ICD-10-CM | POA: Diagnosis not present

## 2020-07-20 DIAGNOSIS — I10 Essential (primary) hypertension: Secondary | ICD-10-CM

## 2020-07-20 DIAGNOSIS — N3281 Overactive bladder: Secondary | ICD-10-CM | POA: Diagnosis not present

## 2020-07-20 MED ORDER — MELOXICAM 15 MG PO TABS: 15.0000 mg | ORAL_TABLET | Freq: Every day | ORAL | 0 refills | Status: DC | PRN

## 2020-07-20 MED ORDER — NITROGLYCERIN 0.4 MG SL SUBL
SUBLINGUAL_TABLET | SUBLINGUAL | 2 refills | Status: DC
Start: 2020-07-20 — End: 2021-08-03
  Filled 2020-07-22: qty 25, 7d supply, fill #0
  Filled 2020-08-02: qty 25, 5d supply, fill #0

## 2020-07-20 MED FILL — Nitroglycerin SL Tab 0.4 MG: SUBLINGUAL | 7 days supply | Qty: 25 | Fill #0 | Status: CN

## 2020-07-20 NOTE — Telephone Encounter (Signed)
According to Pharmacy rx on file has expired  Medication:   nitroGLYCERIN (NITROSTAT) 0.4 MG SL tablet [[id number]]     Has the patient contacted their pharmacy? Yes.   (If no, request that the patient contact the pharmacy for the refill.) (If yes, when and what did the pharmacy advise?) call your doc     Preferred Pharmacy (with phone number or street name): [facility]  [address] [place] [address]  Phone:  [phone]  Fax:  [phone]  DEA #:  [id number]     Agent: Please be advised that RX refills may take up to 3 business days. We ask that you follow-up with your pharmacy.
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
## 2020-07-20 NOTE — Patient Instructions (Addendum)
Use the meloxicam sparingly.   Use the nitro as needed for your chest pain. Please reach out to Dr. Allyson Sabal today telling him you are having "exertional chest pain" and this is a recent change in your symptoms. If symptoms worsen or do not get better with the nitro, please go to the ER.   Keep the diet clean and stay active.  Send me a message in 1 week regarding your urinary symptoms off of the oxybutynin.   Let us know if you need anything.

## 2020-07-20 NOTE — Progress Notes (Signed)
k

## 2020-07-22 ENCOUNTER — Other Ambulatory Visit (HOSPITAL_BASED_OUTPATIENT_CLINIC_OR_DEPARTMENT_OTHER): Payer: Self-pay

## 2020-07-28 ENCOUNTER — Telehealth: Payer: Self-pay | Admitting: Family Medicine

## 2020-07-28 NOTE — Telephone Encounter (Signed)
I'm fine with that, but he sees Dr. Allyson Sabal so may need to run it by their practice. Ty.

## 2020-07-28 NOTE — Telephone Encounter (Signed)
Please advise- was seen for chest pain on 07/20/20.

## 2020-07-28 NOTE — Telephone Encounter (Signed)
PT would like to be referred to a Cardiology.  He would like be see Dr. Servando Salina.

## 2020-07-29 ENCOUNTER — Other Ambulatory Visit (HOSPITAL_BASED_OUTPATIENT_CLINIC_OR_DEPARTMENT_OTHER): Payer: Self-pay

## 2020-07-29 NOTE — Telephone Encounter (Signed)
Called left message to call back 

## 2020-07-29 NOTE — Telephone Encounter (Signed)
Called left msg to call back. 

## 2020-08-01 NOTE — Telephone Encounter (Signed)
Called left message to call back 

## 2020-08-02 ENCOUNTER — Other Ambulatory Visit (HOSPITAL_BASED_OUTPATIENT_CLINIC_OR_DEPARTMENT_OTHER): Payer: Self-pay

## 2020-08-02 NOTE — Telephone Encounter (Signed)
Called left detailed message of PCP instructions. 

## 2020-08-10 ENCOUNTER — Telehealth: Payer: Self-pay | Admitting: Cardiovascular Disease

## 2020-08-10 NOTE — Telephone Encounter (Signed)
Returned call to patients wife who states that patient would like to switch from Dr. Allyson Sabal to Dr. Servando Salina. Patients wife states that they are aware that Dr. Servando Salina is moving to the [facility] office and states that they would prefer to still switch to Dr. Servando Salina. Advised that I would forward request to Dr. Servando Salina and Dr. Allyson Sabal. Patients wife verbalized understanding.

## 2020-08-10 NOTE — Telephone Encounter (Signed)
Vincent [name], Significant other of the patient called.  The patient wanted to switch from Dr. Allyson Sabal to Dr. Servando Salina.  Please let the patient know what the office decides

## 2020-09-01 ENCOUNTER — Ambulatory Visit (HOSPITAL_BASED_OUTPATIENT_CLINIC_OR_DEPARTMENT_OTHER): Payer: Medicare Other | Admitting: Family
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
## 2020-09-15 ENCOUNTER — Other Ambulatory Visit (HOSPITAL_BASED_OUTPATIENT_CLINIC_OR_DEPARTMENT_OTHER): Payer: Self-pay

## 2020-10-18 ENCOUNTER — Other Ambulatory Visit: Payer: Self-pay

## 2020-10-18 ENCOUNTER — Ambulatory Visit (INDEPENDENT_AMBULATORY_CARE_PROVIDER_SITE_OTHER): Payer: Medicare Other | Admitting: Family

## 2020-10-18 ENCOUNTER — Other Ambulatory Visit (HOSPITAL_BASED_OUTPATIENT_CLINIC_OR_DEPARTMENT_OTHER): Payer: Self-pay

## 2020-10-18 VITALS — BP 154/90 | HR 77 | Temp 98.3°F | Resp 16 | Ht 71.5 in | Wt 225.0 lb

## 2020-10-18 DIAGNOSIS — R350 Frequency of micturition: Secondary | ICD-10-CM | POA: Diagnosis not present

## 2020-10-18 DIAGNOSIS — M5416 Radiculopathy, lumbar region: Secondary | ICD-10-CM

## 2020-10-18 DIAGNOSIS — M79642 Pain in left hand: Secondary | ICD-10-CM

## 2020-10-18 LAB — URIC ACID: Uric Acid, Serum: 6.1 mg/dL (ref 4.0–7.8)

## 2020-10-18 MED ORDER — ACETAMINOPHEN ER 650 MG PO TBCR: 650.0000 mg | EXTENDED_RELEASE_TABLET | Freq: Three times a day (TID) | ORAL | Status: DC | PRN

## 2020-10-18 MED ORDER — OXYBUTYNIN CHLORIDE ER 5 MG PO TB24
5.0000 mg | ORAL_TABLET | Freq: Every day | ORAL | 0 refills | Status: DC
  Filled 2020-10-18: qty 90, 90d supply, fill #0

## 2020-10-18 NOTE — Assessment & Plan Note (Signed)
Pain most consistent with lumbar radiculopathy. He had nausea on meloxicam and it "felt too strong." Also on Plavix. He notes relief with tylenol. Advised pt to continue tylenol 650mg  TID prn. We also discussed referral to PT. He does not have time but notes that he would be willing to do some home exercise.  Pt was given handout on exercises to do at home.  He is advised to call if symptoms worsen or if symptoms do not improve.

## 2020-10-18 NOTE — Assessment & Plan Note (Signed)
Has ditropan on his list. He is not taking. Advised patient to restart.

## 2020-10-18 NOTE — Patient Instructions (Signed)
Please add tylenol 1-2 times daily for back pain. Restart oxybutynin for overactive bladder.
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
## 2020-10-19 ENCOUNTER — Other Ambulatory Visit (HOSPITAL_BASED_OUTPATIENT_CLINIC_OR_DEPARTMENT_OTHER): Payer: Self-pay

## 2020-10-19 LAB — URINE CULTURE
MICRO NUMBER:: 12487546
Result:: NO GROWTH
SPECIMEN QUALITY:: ADEQUATE

## 2020-10-20 ENCOUNTER — Other Ambulatory Visit (HOSPITAL_BASED_OUTPATIENT_CLINIC_OR_DEPARTMENT_OTHER): Payer: Self-pay

## 2020-10-20 ENCOUNTER — Telehealth: Payer: Self-pay | Admitting: Family

## 2020-10-20 MED ORDER — COLCHICINE 0.6 MG PO TABS
ORAL_TABLET | ORAL | 0 refills | Status: DC
  Filled 2020-10-20: qty 3, 1d supply, fill #0

## 2020-10-20 NOTE — Telephone Encounter (Signed)
Patient advised of results and new medication. Also advised of possible side effect. He verbalized understanding

## 2020-10-20 NOTE — Telephone Encounter (Signed)
Urine culture is negative for infection.  Uric acid level is high normal. It is possible that the pain in his hand could be due to gout.  I will send an rx for colchicine for him to try.    Please advise him that it may cause brief diarrhea so he should take when he is going to be home.

## 2020-11-08 ENCOUNTER — Other Ambulatory Visit (HOSPITAL_BASED_OUTPATIENT_CLINIC_OR_DEPARTMENT_OTHER): Payer: Self-pay

## 2020-11-08 ENCOUNTER — Ambulatory Visit (INDEPENDENT_AMBULATORY_CARE_PROVIDER_SITE_OTHER): Payer: Medicare Other | Admitting: Family Medicine

## 2020-11-08 ENCOUNTER — Encounter: Payer: Self-pay | Admitting: Family Medicine

## 2020-11-08 ENCOUNTER — Other Ambulatory Visit: Payer: Self-pay

## 2020-11-08 VITALS — BP 130/76 | HR 83 | Temp 98.1°F | Ht 71.5 in | Wt 231.2 lb

## 2020-11-08 DIAGNOSIS — M5416 Radiculopathy, lumbar region: Secondary | ICD-10-CM | POA: Diagnosis not present

## 2020-11-08 MED ORDER — METHYLPREDNISOLONE 4 MG PO TBPK
ORAL_TABLET | ORAL | 0 refills | Status: DC
  Filled 2020-11-08: qty 21, 6d supply, fill #0
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
## 2020-11-08 NOTE — Patient Instructions (Signed)
Someone will reach out regarding your MRI.  Heat (pad or rice pillow in microwave) over affected area, 10-15 minutes twice daily.   Ice/cold pack over area for 10-15 min twice daily.  Continue the Tylenol.   Let us know if you need anything.  EXERCISES  RANGE OF MOTION (ROM) AND STRETCHING EXERCISES - Low Back Pain Most people with lower back pain will find that their symptoms get worse with excessive bending forward (flexion) or arching at the lower back (extension). The exercises that will help resolve your symptoms will focus on the opposite motion.  If you have pain, numbness or tingling which travels down into your buttocks, leg or foot, the goal of the therapy is for these symptoms to move closer to your back and eventually resolve. Sometimes, these leg symptoms will get better, but your lower back pain may worsen. This is often an indication of progress in your rehabilitation. Be very alert to any changes in your symptoms and the activities in which you participated in the 24 hours prior to the change. Sharing this information with your caregiver will allow him or her to most efficiently treat your condition. These exercises may help you when beginning to rehabilitate your injury. Your symptoms may resolve with or without further involvement from your physician, physical therapist or athletic trainer. While completing these exercises, remember:  Restoring tissue flexibility helps normal motion to return to the joints. This allows healthier, less painful movement and activity. An effective stretch should be held for at least 30 seconds. A stretch should never be painful. You should only feel a gentle lengthening or release in the stretched tissue. FLEXION RANGE OF MOTION AND STRETCHING EXERCISES:  STRETCH - Flexion, Single Knee to Chest  Lie on a firm bed or floor with both legs extended in front of you. Keeping one leg in contact with the floor, bring your opposite knee to your chest.  Hold your leg in place by either grabbing behind your thigh or at your knee. Pull until you feel a gentle stretch in your low back. Hold 30 seconds. Slowly release your grasp and repeat the exercise with the opposite side. Repeat 2 times. Complete this exercise 3 times per week.   STRETCH - Flexion, Double Knee to Chest Lie on a firm bed or floor with both legs extended in front of you. Keeping one leg in contact with the floor, bring your opposite knee to your chest. Tense your stomach muscles to support your back and then lift your other knee to your chest. Hold your legs in place by either grabbing behind your thighs or at your knees. Pull both knees toward your chest until you feel a gentle stretch in your low back. Hold 30 seconds. Tense your stomach muscles and slowly return one leg at a time to the floor. Repeat 2 times. Complete this exercise 3 times per week.   STRETCH - Low Trunk Rotation Lie on a firm bed or floor. Keeping your legs in front of you, bend your knees so they are both pointed toward the ceiling and your feet are flat on the floor. Extend your arms out to the side. This will stabilize your upper body by keeping your shoulders in contact with the floor. Gently and slowly drop both knees together to one side until you feel a gentle stretch in your low back. Hold for 30 seconds. Tense your stomach muscles to support your lower back as you bring your knees back to the starting position.  Repeat the exercise to the other side. Repeat 2 times. Complete this exercise at least 3 times per week.   EXTENSION RANGE OF MOTION AND FLEXIBILITY EXERCISES:  STRETCH - Extension, Prone on Elbows  Lie on your stomach on the floor, a bed will be too soft. Place your palms about shoulder width apart and at the height of your head. Place your elbows under your shoulders. If this is too painful, stack pillows under your chest. Allow your body to relax so that your hips drop lower and make  contact more completely with the floor. Hold this position for 30 seconds. Slowly return to lying flat on the floor. Repeat 2 times. Complete this exercise 3 times per week.   RANGE OF MOTION - Extension, Prone Press Ups Lie on your stomach on the floor, a bed will be too soft. Place your palms about shoulder width apart and at the height of your head. Keeping your back as relaxed as possible, slowly straighten your elbows while keeping your hips on the floor. You may adjust the placement of your hands to maximize your comfort. As you gain motion, your hands will come more underneath your shoulders. Hold this position 30 seconds. Slowly return to lying flat on the floor. Repeat 2 times. Complete this exercise 3 times per week.   RANGE OF MOTION- Quadruped, Neutral Spine  Assume a hands and knees position on a firm surface. Keep your hands under your shoulders and your knees under your hips. You may place padding under your knees for comfort. Drop your head and point your tailbone toward the ground below you. This will round out your lower back like an angry cat. Hold this position for 30 seconds. Slowly lift your head and release your tail bone so that your back sags into a large arch, like an old horse. Hold this position for 30 seconds. Repeat this until you feel limber in your low back. Now, find your "sweet spot." This will be the most comfortable position somewhere between the two previous positions. This is your neutral spine. Once you have found this position, tense your stomach muscles to support your low back. Hold this position for 30 seconds. Repeat 2 times. Complete this exercise 3 times per week.   STRENGTHENING EXERCISES - Low Back Sprain These exercises may help you when beginning to rehabilitate your injury. These exercises should be done near your "sweet spot." This is the neutral, low-back arch, somewhere between fully rounded and fully arched, that is your least painful  position. When performed in this safe range of motion, these exercises can be used for people who have either a flexion or extension based injury. These exercises may resolve your symptoms with or without further involvement from your physician, physical therapist or athletic trainer. While completing these exercises, remember:  Muscles can gain both the endurance and the strength needed for everyday activities through controlled exercises. Complete these exercises as instructed by your physician, physical therapist or athletic trainer. Increase the resistance and repetitions only as guided. You may experience muscle soreness or fatigue, but the pain or discomfort you are trying to eliminate should never worsen during these exercises. If this pain does worsen, stop and make certain you are following the directions exactly. If the pain is still present after adjustments, discontinue the exercise until you can discuss the trouble with your caregiver.  STRENGTHENING - Deep Abdominals, Pelvic Tilt  Lie on a firm bed or floor. Keeping your legs in front of you,  bend your knees so they are both pointed toward the ceiling and your feet are flat on the floor. Tense your lower abdominal muscles to press your low back into the floor. This motion will rotate your pelvis so that your tail bone is scooping upwards rather than pointing at your feet or into the floor. With a gentle tension and even breathing, hold this position for 3 seconds. Repeat 2 times. Complete this exercise 3 times per week.   STRENGTHENING - Abdominals, Crunches  Lie on a firm bed or floor. Keeping your legs in front of you, bend your knees so they are both pointed toward the ceiling and your feet are flat on the floor. Cross your arms over your chest. Slightly tip your chin down without bending your neck. Tense your abdominals and slowly lift your trunk high enough to just clear your shoulder blades. Lifting higher can put excessive stress  on the lower back and does not further strengthen your abdominal muscles. Control your return to the starting position. Repeat 2 times. Complete this exercise 3 times per week.   STRENGTHENING - Quadruped, Opposite UE/LE Lift  Assume a hands and knees position on a firm surface. Keep your hands under your shoulders and your knees under your hips. You may place padding under your knees for comfort. Find your neutral spine and gently tense your abdominal muscles so that you can maintain this position. Your shoulders and hips should form a rectangle that is parallel with the floor and is not twisted. Keeping your trunk steady, lift your right hand no higher than your shoulder and then your left leg no higher than your hip. Make sure you are not holding your breath. Hold this position for 30 seconds. Continuing to keep your abdominal muscles tense and your back steady, slowly return to your starting position. Repeat with the opposite arm and leg. Repeat 2 times. Complete this exercise 3 times per week.   STRENGTHENING - Abdominals and Quadriceps, Straight Leg Raise  Lie on a firm bed or floor with both legs extended in front of you. Keeping one leg in contact with the floor, bend the other knee so that your foot can rest flat on the floor. Find your neutral spine, and tense your abdominal muscles to maintain your spinal position throughout the exercise. Slowly lift your straight leg off the floor about 6 inches for a count of 3, making sure to not hold your breath. Still keeping your neutral spine, slowly lower your leg all the way to the floor. Repeat this exercise with each leg 2 times. Complete this exercise 3 times per week.  POSTURE AND BODY MECHANICS CONSIDERATIONS - Low Back Sprain Keeping correct posture when sitting, standing or completing your activities will reduce the stress put on different body tissues, allowing injured tissues a chance to heal and limiting painful experiences. The  following are general guidelines for improved posture.  While reading these guidelines, remember: The exercises prescribed by your provider will help you have the flexibility and strength to maintain correct postures. The correct posture provides the best environment for your joints to work. All of your joints have less wear and tear when properly supported by a spine with good posture. This means you will experience a healthier, less painful body. Correct posture must be practiced with all of your activities, especially prolonged sitting and standing. Correct posture is as important when doing repetitive low-stress activities (typing) as it is when doing a single heavy-load activity (lifting).  RESTING POSITIONS Consider which positions are most painful for you when choosing a resting position. If you have pain with flexion-based activities (sitting, bending, stooping, squatting), choose a position that allows you to rest in a less flexed posture. You would want to avoid curling into a fetal position on your side. If your pain worsens with extension-based activities (prolonged standing, working overhead), avoid resting in an extended position such as sleeping on your stomach. Most people will find more comfort when they rest with their spine in a more neutral position, neither too rounded nor too arched. Lying on a non-sagging bed on your side with a pillow between your knees, or on your back with a pillow under your knees will often provide some relief. Keep in mind, being in any one position for a prolonged period of time, no matter how correct your posture, can still lead to stiffness.  PROPER SITTING POSTURE In order to minimize stress and discomfort on your spine, you must sit with correct posture. Sitting with good posture should be effortless for a healthy body. Returning to good posture is a gradual process. Many people can work toward this most comfortably by using various supports until they have  the flexibility and strength to maintain this posture on their own. When sitting with proper posture, your ears will fall over your shoulders and your shoulders will fall over your hips. You should use the back of the chair to support your upper back. Your lower back will be in a neutral position, just slightly arched. You may place a small pillow or folded towel at the base of your lower back for  support.  When working at a desk, create an environment that supports good, upright posture. Without extra support, muscles tire, which leads to excessive strain on joints and other tissues. Keep these recommendations in mind:  CHAIR: A chair should be able to slide under your desk when your back makes contact with the back of the chair. This allows you to work closely. The chair's height should allow your eyes to be level with the upper part of your monitor and your hands to be slightly lower than your elbows.  BODY POSITION Your feet should make contact with the floor. If this is not possible, use a foot rest. Keep your ears over your shoulders. This will reduce stress on your neck and low back.  INCORRECT SITTING POSTURES  If you are feeling tired and unable to assume a healthy sitting posture, do not slouch or slump. This puts excessive strain on your back tissues, causing more damage and pain. Healthier options include: Using more support, like a lumbar pillow. Switching tasks to something that requires you to be upright or walking. Talking a brief walk. Lying down to rest in a neutral-spine position.  PROLONGED STANDING WHILE SLIGHTLY LEANING FORWARD  When completing a task that requires you to lean forward while standing in one place for a long time, place either foot up on a stationary 2-4 inch high object to help maintain the best posture. When both feet are on the ground, the lower back tends to lose its slight inward curve. If this curve flattens (or becomes too large), then the back and  your other joints will experience too much stress, tire more quickly, and can cause pain.  CORRECT STANDING POSTURES Proper standing posture should be assumed with all daily activities, even if they only take a few moments, like when brushing your teeth. As in sitting, your ears should  fall over your shoulders and your shoulders should fall over your hips. You should keep a slight tension in your abdominal muscles to brace your spine. Your tailbone should point down to the ground, not behind your body, resulting in an over-extended swayback posture.   INCORRECT STANDING POSTURES  Common incorrect standing postures include a forward head, locked knees and/or an excessive swayback. WALKING Walk with an upright posture. Your ears, shoulders and hips should all line-up.  PROLONGED ACTIVITY IN A FLEXED POSITION When completing a task that requires you to bend forward at your waist or lean over a low surface, try to find a way to stabilize 3 out of 4 of your limbs. You can place a hand or elbow on your thigh or rest a knee on the surface you are reaching across. This will provide you more stability, so that your muscles do not tire as quickly. By keeping your knees relaxed, or slightly bent, you will also reduce stress across your lower back. CORRECT LIFTING TECHNIQUES  DO : Assume a wide stance. This will provide you more stability and the opportunity to get as close as possible to the object which you are lifting. Tense your abdominals to brace your spine. Bend at the knees and hips. Keeping your back locked in a neutral-spine position, lift using your leg muscles. Lift with your legs, keeping your back straight. Test the weight of unknown objects before attempting to lift them. Try to keep your elbows locked down at your sides in order get the best strength from your shoulders when carrying an object.   Always ask for help when lifting heavy or awkward objects. INCORRECT LIFTING TECHNIQUES DO  NOT:  Lock your knees when lifting, even if it is a small object. Bend and twist. Pivot at your feet or move your feet when needing to change directions. Assume that you can safely pick up even a paperclip without proper posture.

## 2020-11-13 ENCOUNTER — Ambulatory Visit (INDEPENDENT_AMBULATORY_CARE_PROVIDER_SITE_OTHER): Payer: Medicare Other

## 2020-11-13 ENCOUNTER — Other Ambulatory Visit: Payer: Self-pay

## 2020-11-13 DIAGNOSIS — M79605 Pain in left leg: Secondary | ICD-10-CM

## 2020-11-13 DIAGNOSIS — M5416 Radiculopathy, lumbar region: Secondary | ICD-10-CM | POA: Diagnosis not present

## 2020-11-13 DIAGNOSIS — M5126 Other intervertebral disc displacement, lumbar region: Secondary | ICD-10-CM | POA: Diagnosis not present

## 2020-11-13 DIAGNOSIS — M48061 Spinal stenosis, lumbar region without neurogenic claudication: Secondary | ICD-10-CM | POA: Diagnosis not present

## 2020-11-14 ENCOUNTER — Other Ambulatory Visit: Payer: Self-pay | Admitting: Family Medicine

## 2020-11-14 DIAGNOSIS — M5416 Radiculopathy, lumbar region: Secondary | ICD-10-CM

## 2020-11-16 ENCOUNTER — Other Ambulatory Visit: Payer: Self-pay

## 2020-11-16 ENCOUNTER — Emergency Department (HOSPITAL_BASED_OUTPATIENT_CLINIC_OR_DEPARTMENT_OTHER)
Admission: EM | Admit: 2020-11-16 | Discharge: 2020-11-16 | Disposition: A | Discharge status: Home / Self Care | Payer: Medicare Other | Attending: Emergency Medicine | Admitting: Emergency Medicine

## 2020-11-16 ENCOUNTER — Other Ambulatory Visit (HOSPITAL_BASED_OUTPATIENT_CLINIC_OR_DEPARTMENT_OTHER): Payer: Self-pay

## 2020-11-16 ENCOUNTER — Encounter (HOSPITAL_BASED_OUTPATIENT_CLINIC_OR_DEPARTMENT_OTHER): Payer: Self-pay | Admitting: Emergency Medicine

## 2020-11-16 DIAGNOSIS — Z7982 Long term (current) use of aspirin: Secondary | ICD-10-CM | POA: Diagnosis not present

## 2020-11-16 DIAGNOSIS — H81399 Other peripheral vertigo, unspecified ear: Secondary | ICD-10-CM | POA: Diagnosis not present

## 2020-11-16 DIAGNOSIS — Z8616 Personal history of COVID-19: Secondary | ICD-10-CM | POA: Insufficient documentation

## 2020-11-16 DIAGNOSIS — Z79899 Other long term (current) drug therapy: Secondary | ICD-10-CM | POA: Insufficient documentation

## 2020-11-16 DIAGNOSIS — I10 Essential (primary) hypertension: Secondary | ICD-10-CM | POA: Insufficient documentation

## 2020-11-16 DIAGNOSIS — Z7902 Long term (current) use of antithrombotics/antiplatelets: Secondary | ICD-10-CM | POA: Insufficient documentation

## 2020-11-16 DIAGNOSIS — R55 Syncope and collapse: Secondary | ICD-10-CM | POA: Insufficient documentation

## 2020-11-16 DIAGNOSIS — R42 Dizziness and giddiness: Secondary | ICD-10-CM | POA: Diagnosis present

## 2020-11-16 DIAGNOSIS — E119 Type 2 diabetes mellitus without complications: Secondary | ICD-10-CM | POA: Diagnosis not present

## 2020-11-16 DIAGNOSIS — E876 Hypokalemia: Secondary | ICD-10-CM | POA: Diagnosis not present

## 2020-11-16 LAB — CBC WITH DIFFERENTIAL/PLATELET
Abs Immature Granulocytes: 0.01 10*3/uL (ref 0.00–0.07)
Basophils Absolute: 0 10*3/uL (ref 0.0–0.1)
Basophils Relative: 1 %
Eosinophils Absolute: 0.2 10*3/uL (ref 0.0–0.5)
Eosinophils Relative: 4 %
HCT: 41.7 % (ref 39.0–52.0)
Hemoglobin: 13.7 g/dL (ref 13.0–17.0)
Immature Granulocytes: 0 %
Lymphocytes Relative: 45 %
Lymphs Abs: 2.5 10*3/uL (ref 0.7–4.0)
MCH: 27.8 pg (ref 26.0–34.0)
MCHC: 32.9 g/dL (ref 30.0–36.0)
MCV: 84.6 fL (ref 80.0–100.0)
Monocytes Absolute: 0.6 10*3/uL (ref 0.1–1.0)
Monocytes Relative: 11 %
Neutro Abs: 2.1 10*3/uL (ref 1.7–7.7)
Neutrophils Relative %: 39 %
Platelets: 171 10*3/uL (ref 150–400)
RBC: 4.93 MIL/uL (ref 4.22–5.81)
RDW: 13.9 % (ref 11.5–15.5)
WBC: 5.5 10*3/uL (ref 4.0–10.5)
nRBC: 0 % (ref 0.0–0.2)

## 2020-11-16 LAB — BASIC METABOLIC PANEL
Anion gap: 7 (ref 5–15)
BUN: 14 mg/dL (ref 8–23)
CO2: 28 mmol/L (ref 22–32)
Calcium: 8.7 mg/dL — ABNORMAL LOW (ref 8.9–10.3)
Chloride: 105 mmol/L (ref 98–111)
Creatinine, Ser: 0.96 mg/dL (ref 0.61–1.24)
GFR, Estimated: >60 mL/min (ref >60)
Glucose, Bld: 115 mg/dL — ABNORMAL HIGH (ref 70–99)
Potassium: 3.2 mmol/L — ABNORMAL LOW (ref 3.5–5.1)
Sodium: 140 mmol/L (ref 135–145)

## 2020-11-16 MED ORDER — POTASSIUM CHLORIDE CRYS ER 20 MEQ PO TBCR
40.0000 meq | EXTENDED_RELEASE_TABLET | Freq: Once | ORAL | Status: AC
  Administered 2020-11-16: 40 meq via ORAL
  Filled 2020-11-16: qty 2

## 2020-11-16 MED ORDER — MECLIZINE HCL 25 MG PO TABS
25.0000 mg | ORAL_TABLET | Freq: Three times a day (TID) | ORAL | 0 refills | Status: DC | PRN
  Filled 2020-11-16: qty 30, 10d supply, fill #0

## 2020-11-16 MED ORDER — POTASSIUM CHLORIDE CRYS ER 20 MEQ PO TBCR
20.0000 meq | EXTENDED_RELEASE_TABLET | Freq: Two times a day (BID) | ORAL | 0 refills | Status: DC
  Filled 2020-11-16: qty 10, 5d supply, fill #0

## 2020-11-16 MED ORDER — MECLIZINE HCL 25 MG PO TABS
25.0000 mg | ORAL_TABLET | Freq: Once | ORAL | Status: AC
  Administered 2020-11-16: 25 mg via ORAL
  Filled 2020-11-16: qty 1

## 2020-11-16 MED FILL — Amlodipine Besylate Tab 10 MG (Base Equivalent): ORAL | 90 days supply | Qty: 90 | Fill #1 | Status: AC

## 2020-11-16 MED FILL — Clopidogrel Bisulfate Tab 75 MG (Base Equiv): ORAL | 90 days supply | Qty: 90 | Fill #1 | Status: AC
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
## 2020-11-16 NOTE — ED Triage Notes (Signed)
Pt is c/o dizziness  Pt states he started taking Methylprednisolone dose pack this week and states he started having dizziness after he started taking the medication  Pt also states he has a hx of vertigo  Pt is requesting a covid test while he is here  Denies any sxs of covid but would like to have one anyway   Pt has had some nausea but denies vomiting

## 2020-11-16 NOTE — Discharge Instructions (Addendum)
Return if symptoms are getting worse. °

## 2020-11-29 ENCOUNTER — Other Ambulatory Visit: Payer: Self-pay

## 2020-11-29 ENCOUNTER — Ambulatory Visit (INDEPENDENT_AMBULATORY_CARE_PROVIDER_SITE_OTHER): Payer: Medicare Other | Admitting: Family Medicine

## 2020-11-29 ENCOUNTER — Other Ambulatory Visit (HOSPITAL_BASED_OUTPATIENT_CLINIC_OR_DEPARTMENT_OTHER): Payer: Self-pay

## 2020-11-29 ENCOUNTER — Encounter: Payer: Self-pay | Admitting: Family Medicine

## 2020-11-29 VITALS — BP 138/70 | HR 77 | Temp 98.3°F | Ht 72.0 in | Wt 230.0 lb

## 2020-11-29 DIAGNOSIS — E1169 Type 2 diabetes mellitus with other specified complication: Secondary | ICD-10-CM

## 2020-11-29 DIAGNOSIS — I10 Essential (primary) hypertension: Secondary | ICD-10-CM

## 2020-11-29 DIAGNOSIS — E669 Obesity, unspecified: Secondary | ICD-10-CM | POA: Diagnosis not present

## 2020-11-29 LAB — COMPREHENSIVE METABOLIC PANEL
ALT: 26 U/L (ref 0–53)
AST: 25 U/L (ref 0–37)
Albumin: 4.3 g/dL (ref 3.5–5.2)
Alkaline Phosphatase: 64 U/L (ref 39–117)
BUN: 11 mg/dL (ref 6–23)
CO2: 32 mEq/L (ref 19–32)
Calcium: 9.3 mg/dL (ref 8.4–10.5)
Chloride: 102 mEq/L (ref 96–112)
Creatinine, Ser: 0.99 mg/dL (ref 0.40–1.50)
GFR: 79.91 mL/min (ref >60.00)
Glucose, Bld: 109 mg/dL — ABNORMAL HIGH (ref 70–99)
Potassium: 4 mEq/L (ref 3.5–5.1)
Sodium: 140 mEq/L (ref 135–145)
Total Bilirubin: 0.5 mg/dL (ref 0.2–1.2)
Total Protein: 7.1 g/dL (ref 6.0–8.3)

## 2020-11-29 LAB — LIPID PANEL
Cholesterol: 180 mg/dL (ref 0–200)
HDL: 59.5 mg/dL (ref >39.00)
LDL Cholesterol: 106 mg/dL — ABNORMAL HIGH (ref 0–99)
NonHDL: 120.05
Total CHOL/HDL Ratio: 3
Triglycerides: 70 mg/dL (ref 0.0–149.0)
VLDL: 14 mg/dL (ref 0.0–40.0)

## 2020-11-29 LAB — HEMOGLOBIN A1C: Hgb A1c MFr Bld: 6.6 % — ABNORMAL HIGH (ref 4.6–6.5)

## 2020-11-29 MED ORDER — LOSARTAN POTASSIUM 100 MG PO TABS
100.0000 mg | ORAL_TABLET | Freq: Every day | ORAL | 2 refills | Status: DC
  Filled 2020-11-29 – 2021-05-19 (×2): qty 90, 90d supply, fill #0

## 2020-11-29 NOTE — Patient Instructions (Addendum)
Give [place] 2-3 business days to get the results of your labs back.   Keep the diet clean and stay active.  OK to take Tylenol 1000 mg (2 extra strength tabs) or 975 mg (3 regular strength tabs) every 6 hours as needed.  Clear our your voicemail box.   Ortho team for back issues: [phone].  If you do not hear anything about your referral in the next 1-2 weeks, call our office and ask for an update.  Let us know if you need anything.  Knee Exercises It is normal to feel mild stretching, pulling, tightness, or discomfort as you do these exercises, but you should stop right away if you feel sudden pain or your pain gets worse.  STRETCHING AND RANGE OF MOTION EXERCISES  These exercises warm up your muscles and joints and improve the movement and flexibility of your knee. These exercises also help to relieve pain, numbness, and tingling. Exercise A: Knee Extension, Prone  Lie on your abdomen on a bed. Place your left / right knee just beyond the edge of the surface so your knee is not on the bed. You can put a towel under your left / right thigh just above your knee for comfort. Relax your leg muscles and allow gravity to straighten your knee. You should feel a stretch behind your left / right knee. Hold this position for 30 seconds. Scoot up so your knee is supported between repetitions. Repeat 2 times. Complete this stretch 3 times per week. Exercise B: Knee Flexion, Active     Lie on your back with both knees straight. If this causes back discomfort, bend your left / right knee so your foot is flat on the floor. Slowly slide your left / right heel back toward your buttocks until you feel a gentle stretch in the front of your knee or thigh. Hold this position for 30 seconds. Slowly slide your left / right heel back to the starting position. Repeat 2 times. Complete this exercise 3 times per week. Exercise C: Quadriceps, Prone     Lie on your abdomen on a firm surface, such as a bed  or padded floor. Bend your left / right knee and hold your ankle. If you cannot reach your ankle or pant leg, loop a belt around your foot and grab the belt instead. Gently pull your heel toward your buttocks. Your knee should not slide out to the side. You should feel a stretch in the front of your thigh and knee. Hold this position for 30 seconds. Repeat 2 times. Complete this stretch 3 times per week. Exercise D: Hamstring, Supine  Lie on your back. Loop a belt or towel over the ball of your left / right foot. The ball of your foot is on the walking surface, right under your toes. Straighten your left / right knee and slowly pull on the belt to raise your leg until you feel a gentle stretch behind your knee. Do not let your left / right knee bend while you do this. Keep your other leg flat on the floor. Hold this position for 30 seconds. Repeat 2 times. Complete this stretch 3 times per week. STRENGTHENING EXERCISES  These exercises build strength and endurance in your knee. Endurance is the ability to use your muscles for a long time, even after they get tired. Exercise E: Quadriceps, Isometric     Lie on your back with your left / right leg extended and your other knee bent. Put a  rolled towel or small pillow under your knee if told by your health care provider. Slowly tense the muscles in the front of your left / right thigh. You should see your kneecap slide up toward your hip or see increased dimpling just above the knee. This motion will push the back of the knee toward the floor. For 3 seconds, keep the muscle as tight as you can without increasing your pain. Relax the muscles slowly and completely. Repeat for 10 total reps Repeat 2 ti mes. Complete this exercise 3 times per week. Exercise F: Straight Leg Raises - Quadriceps  Lie on your back with your left / right leg extended and your other knee bent. Tense the muscles in the front of your left / right thigh. You should see your  kneecap slide up or see increased dimpling just above the knee. Your thigh may even shake a bit. Keep these muscles tight as you raise your leg 4-6 inches (10-15 cm) off the floor. Do not let your knee bend. Hold this position for 3 seconds. Keep these muscles tense as you lower your leg. Relax your muscles slowly and completely after each repetition. 10 total reps. Repeat 2 times. Complete this exercise 3 times per week.  Exercise G: Hamstring Curls     If told by your health care provider, do this exercise while wearing ankle weights. Begin with 5 lb weights (optional). Then increase the weight by 1 lb (0.5 kg) increments. Do not wear ankle weights that are more than 20 lbs to start with. Lie on your abdomen with your legs straight. Bend your left / right knee as far as you can without feeling pain. Keep your hips flat against the floor. Hold this position for 3 seconds. Slowly lower your leg to the starting position. Repeat for 10 reps.  Repeat 2 times. Complete this exercise 3 times per week. Exercise H: Squats (Quadriceps)  Stand in front of a table, with your feet and knees pointing straight ahead. You may rest your hands on the table for balance but not for support. Slowly bend your knees and lower your hips like you are going to sit in a chair. Keep your weight over your heels, not over your toes. Keep your lower legs upright so they are parallel with the table legs. Do not let your hips go lower than your knees. Do not bend lower than told by your health care provider. If your knee pain increases, do not bend as low. Hold the squat position for 1 second. Slowly push with your legs to return to standing. Do not use your hands to pull yourself to standing. Repeat 2 times. Complete this exercise 3 times per week. Exercise I: Wall Slides (Quadriceps)     Lean your back against a smooth wall or door while you walk your feet out 18-24 inches (46-61 cm) from it. Place your feet  hip-width apart. Slowly slide down the wall or door until your knees Repeat 2 times. Complete this exercise every other day. Exercise K: Straight Leg Raises - Hip Abductors  Lie on your side with your left / right leg in the top position. Lie so your head, shoulder, knee, and hip line up. You may bend your bottom knee to help you keep your balance. Roll your hips slightly forward so your hips are stacked directly over each other and your left / right knee is facing forward. Leading with your heel, lift your top leg 4-6 inches (10-15 cm). You  should feel the muscles in your outer hip lifting. Do not let your foot drift forward. Do not let your knee roll toward the ceiling. Hold this position for 3 seconds. Slowly return your leg to the starting position. Let your muscles relax completely after each repetition. 10 total reps. Repeat 2 times. Complete this exercise 3 times per week. Exercise J: Straight Leg Raises - Hip Extensors  Lie on your abdomen on a firm surface. You can put a pillow under your hips if that is more comfortable. Tense the muscles in your buttocks and lift your left / right leg about 4-6 inches (10-15 cm). Keep your knee straight as you lift your leg. Hold this position for 3 seconds. Slowly lower your leg to the starting position. Let your leg relax completely after each repetition. Repeat 2 times. Complete this exercise 3 times per week. Document Released: 11/08/2004 Document Revised: 09/19/2015 Document Reviewed: 10/31/2014 Elsevier Interactive Patient Education  2017 [employer].
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
## 2020-12-06 ENCOUNTER — Other Ambulatory Visit (HOSPITAL_BASED_OUTPATIENT_CLINIC_OR_DEPARTMENT_OTHER): Payer: Self-pay

## 2020-12-16 ENCOUNTER — Ambulatory Visit: Payer: Self-pay

## 2020-12-16 ENCOUNTER — Other Ambulatory Visit: Payer: Self-pay

## 2020-12-16 ENCOUNTER — Ambulatory Visit (INDEPENDENT_AMBULATORY_CARE_PROVIDER_SITE_OTHER): Payer: Medicare Other | Admitting: Orthopaedic Surgery

## 2020-12-16 VITALS — BP 149/78 | HR 75 | Ht 72.0 in | Wt 230.0 lb

## 2020-12-16 DIAGNOSIS — M545 Low back pain, unspecified: Secondary | ICD-10-CM | POA: Diagnosis not present

## 2020-12-16 DIAGNOSIS — G8929 Other chronic pain: Secondary | ICD-10-CM
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
## 2021-01-03 ENCOUNTER — Encounter: Payer: Medicare Other | Admitting: Family Medicine

## 2021-01-11 ENCOUNTER — Ambulatory Visit (INDEPENDENT_AMBULATORY_CARE_PROVIDER_SITE_OTHER): Payer: Medicare Other | Admitting: Family Medicine

## 2021-01-11 ENCOUNTER — Encounter: Payer: Self-pay | Admitting: Family Medicine

## 2021-01-11 ENCOUNTER — Other Ambulatory Visit: Payer: Self-pay | Admitting: Family Medicine

## 2021-01-11 VITALS — BP 128/82 | HR 68 | Temp 98.2°F | Ht 72.0 in | Wt 233.4 lb

## 2021-01-11 DIAGNOSIS — Z0000 Encounter for general adult medical examination without abnormal findings: Secondary | ICD-10-CM | POA: Diagnosis not present

## 2021-01-11 DIAGNOSIS — Z125 Encounter for screening for malignant neoplasm of prostate: Secondary | ICD-10-CM | POA: Diagnosis not present

## 2021-01-11 DIAGNOSIS — R972 Elevated prostate specific antigen [PSA]: Secondary | ICD-10-CM

## 2021-01-11 LAB — PSA: PSA: 4.11 ng/mL — ABNORMAL HIGH (ref 0.10–4.00)

## 2021-01-11 NOTE — Patient Instructions (Signed)
Give us 2-3 business days to get the results of your labs back.   Keep the diet clean and stay active.  Let us know if you need anything. 
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
## 2021-01-17 ENCOUNTER — Ambulatory Visit: Payer: Medicare Other | Admitting: Orthopaedic Surgery

## 2021-01-25 ENCOUNTER — Other Ambulatory Visit: Payer: Medicare Other

## 2021-01-26 ENCOUNTER — Other Ambulatory Visit: Payer: Self-pay | Admitting: *Deleted

## 2021-01-26 ENCOUNTER — Other Ambulatory Visit (INDEPENDENT_AMBULATORY_CARE_PROVIDER_SITE_OTHER): Payer: Medicare Other

## 2021-01-26 DIAGNOSIS — R972 Elevated prostate specific antigen [PSA]: Secondary | ICD-10-CM | POA: Diagnosis not present

## 2021-01-26 LAB — PSA: PSA: 4.39 ng/mL — ABNORMAL HIGH (ref 0.10–4.00)

## 2021-01-28 ENCOUNTER — Other Ambulatory Visit: Payer: Self-pay

## 2021-01-28 ENCOUNTER — Encounter (HOSPITAL_BASED_OUTPATIENT_CLINIC_OR_DEPARTMENT_OTHER): Payer: Self-pay

## 2021-01-28 ENCOUNTER — Emergency Department (HOSPITAL_BASED_OUTPATIENT_CLINIC_OR_DEPARTMENT_OTHER)
Admission: EM | Admit: 2021-01-28 | Discharge: 2021-01-28 | Disposition: A | Discharge status: Home / Self Care | Payer: Medicare Other | Attending: Emergency Medicine | Admitting: Emergency Medicine

## 2021-01-28 DIAGNOSIS — K529 Noninfective gastroenteritis and colitis, unspecified: Secondary | ICD-10-CM | POA: Diagnosis not present

## 2021-01-28 DIAGNOSIS — Z7982 Long term (current) use of aspirin: Secondary | ICD-10-CM | POA: Diagnosis not present

## 2021-01-28 DIAGNOSIS — Z79899 Other long term (current) drug therapy: Secondary | ICD-10-CM | POA: Diagnosis not present

## 2021-01-28 DIAGNOSIS — R112 Nausea with vomiting, unspecified: Secondary | ICD-10-CM

## 2021-01-28 DIAGNOSIS — R197 Diarrhea, unspecified: Secondary | ICD-10-CM | POA: Diagnosis not present

## 2021-01-28 LAB — COMPREHENSIVE METABOLIC PANEL
ALT: 18 U/L (ref 0–44)
AST: 25 U/L (ref 15–41)
Albumin: 3.9 g/dL (ref 3.5–5.0)
Alkaline Phosphatase: 50 U/L (ref 38–126)
Anion gap: 9 (ref 5–15)
BUN: 14 mg/dL (ref 8–23)
CO2: 27 mmol/L (ref 22–32)
Calcium: 8.6 mg/dL — ABNORMAL LOW (ref 8.9–10.3)
Chloride: 102 mmol/L (ref 98–111)
Creatinine, Ser: 0.82 mg/dL (ref 0.61–1.24)
GFR, Estimated: >60 mL/min (ref >60)
Glucose, Bld: 135 mg/dL — ABNORMAL HIGH (ref 70–99)
Potassium: 3.9 mmol/L (ref 3.5–5.1)
Sodium: 138 mmol/L (ref 135–145)
Total Bilirubin: 0.7 mg/dL (ref 0.3–1.2)
Total Protein: 7.4 g/dL (ref 6.5–8.1)

## 2021-01-28 LAB — URINALYSIS, ROUTINE W REFLEX MICROSCOPIC
Bilirubin Urine: NEGATIVE
Glucose, UA: 100 mg/dL — AB
Hgb urine dipstick: NEGATIVE
Ketones, ur: NEGATIVE mg/dL
Leukocytes,Ua: NEGATIVE
Nitrite: NEGATIVE
Protein, ur: NEGATIVE mg/dL
Specific Gravity, Urine: 1.025 (ref 1.005–1.030)
pH: 7 (ref 5.0–8.0)

## 2021-01-28 LAB — CBC
HCT: 42.1 % (ref 39.0–52.0)
Hemoglobin: 14.4 g/dL (ref 13.0–17.0)
MCH: 28.6 pg (ref 26.0–34.0)
MCHC: 34.2 g/dL (ref 30.0–36.0)
MCV: 83.5 fL (ref 80.0–100.0)
Platelets: 170 10*3/uL (ref 150–400)
RBC: 5.04 MIL/uL (ref 4.22–5.81)
RDW: 13.7 % (ref 11.5–15.5)
WBC: 4.7 10*3/uL (ref 4.0–10.5)
nRBC: 0 % (ref 0.0–0.2)

## 2021-01-28 LAB — LIPASE, BLOOD: Lipase: 26 U/L (ref 11–51)

## 2021-01-28 MED ORDER — SODIUM CHLORIDE 0.9 % IV BOLUS
1000.0000 mL | Freq: Once | INTRAVENOUS | Status: AC
  Administered 2021-01-28: 1000 mL via INTRAVENOUS

## 2021-01-28 MED ORDER — DICYCLOMINE HCL 20 MG PO TABS: 20.0000 mg | ORAL_TABLET | Freq: Two times a day (BID) | ORAL | 0 refills | Status: DC

## 2021-01-28 MED ORDER — DICYCLOMINE HCL 20 MG PO TABS
20.0000 mg | ORAL_TABLET | Freq: Two times a day (BID) | ORAL | 0 refills | Status: DC
  Filled 2021-01-28: qty 20, 10d supply, fill #0

## 2021-01-28 MED ORDER — ONDANSETRON 4 MG PO TBDP
4.0000 mg | ORAL_TABLET | Freq: Three times a day (TID) | ORAL | 0 refills | Status: DC | PRN
  Filled 2021-01-28: qty 20, 7d supply, fill #0

## 2021-01-28 MED ORDER — ONDANSETRON HCL 4 MG/2ML IJ SOLN
4.0000 mg | Freq: Once | INTRAMUSCULAR | Status: AC
  Administered 2021-01-28: 4 mg via INTRAVENOUS
  Filled 2021-01-28: qty 2

## 2021-01-28 MED ORDER — ONDANSETRON 4 MG PO TBDP: 4.0000 mg | ORAL_TABLET | Freq: Three times a day (TID) | ORAL | 0 refills | Status: DC | PRN

## 2021-01-28 NOTE — ED Notes (Signed)
ED Provider at bedside. 

## 2021-01-28 NOTE — ED Provider Notes (Signed)
[facility] EMERGENCY DEPARTMENT Provider Note   CSN: [id number] Arrival date & time: 01/28/21  1010     History  Chief Complaint  Patient presents with   Nausea   Vomiting   Diarrhea    Vincent [name] is a 66 y.o. male who presents to the ED today with complaint of nausea, NBNB emesis, and diarrhea that began around 3 AM this morning.  Patient reports that symptoms woke him up out of his sleep.  He does endorse that he had 2 liquor drinks last night and is unsure if this is contributing to his symptoms.  He does state that whenever he does drink he only drinks a max of 2 drinks and denies drinking any more last night.  He does not drink daily.  He ate [employer] for dinner last night prior to going out and drinking.  He denies any suspicious food intake.  Is any recent sick contacts.  He denies any fevers, chills, abdominal pain, urinary symptoms, any other associated symptoms.  Patient does mention that he was recently told that he has an elevated PSA and has plans to have a biopsy done soon to rule out cancer. No previous abdominal surgeries.   The history is provided by the patient and medical records.      Home Medications Prior to Admission medications   Medication Sig Start Date End Date Taking? Authorizing Provider  dicyclomine (BENTYL) 20 MG tablet Take 1 tablet (20 mg total) by mouth 2 (two) times daily. 01/28/21  Yes Delores Edelstein, PA-C  ondansetron (ZOFRAN-ODT) 4 MG disintegrating tablet Take 1 tablet (4 mg total) by mouth every 8 (eight) hours as needed for nausea or vomiting. 01/28/21  Yes Hyman Hopes, Domique Reardon, PA-C  acetaminophen (TYLENOL 8 HOUR) 650 MG CR tablet Take 1 tablet (650 mg total) by mouth every 8 (eight) hours as needed for pain. 10/18/20   Sandford Craze, NP  amLODipine (NORVASC) 10 MG tablet TAKE 1 TABLET BY MOUTH DAILY 03/04/20 03/04/21  Kallie Locks, FNP  aspirin 81 MG chewable tablet Chew 1 tablet (81 mg total) by mouth daily. 01/25/17    Bing Neighbors, FNP  atorvastatin (LIPITOR) 80 MG tablet TAKE 1 TABLET BY MOUTH DAILY AT 6PM 07/04/20 07/04/21  Sharlene Dory, DO  clopidogrel (PLAVIX) 75 MG tablet TAKE 1 TABLET BY MOUTH ONCE DAILY 03/04/20 03/04/21  Kallie Locks, FNP  colchicine 0.6 MG tablet Take 2 tabs by mouth now and then repeat in 1 hour. 10/20/20   Sandford Craze, NP  losartan (COZAAR) 100 MG tablet Take 1 tablet (100 mg total) by mouth daily. 11/29/20   Sharlene Dory, DO  meclizine (ANTIVERT) 25 MG tablet Take 1 tablet (25 mg total) by mouth 3 (three) times daily as needed for dizziness. 11/16/20   Dione Booze, MD  metoprolol tartrate (LOPRESSOR) 25 MG tablet TAKE 1/2 TABLETS BY MOUTH 2 TIMES DAILY 03/04/20 03/04/21  Kallie Locks, FNP  nitroGLYCERIN (NITROSTAT) 0.4 MG SL tablet PLACE 1 TABLET UNDER THE TONGUE EVERY 5 MINUTES AS NEEDED 07/20/20 07/20/21  Sharlene Dory, DO  oxybutynin (DITROPAN XL) 5 MG 24 hr tablet Take 1 tablet (5 mg total) by mouth at bedtime. 10/18/20   Sandford Craze, NP  potassium chloride SA (KLOR-CON) 20 MEQ tablet Take 1 tablet (20 mEq total) by mouth 2 (two) times daily. 11/16/20   Dione Booze, MD      Allergies    Patient has no known allergies.    Review  of Systems   Review of Systems  Constitutional:  Negative for chills and fever.  Respiratory:  Negative for cough.   Gastrointestinal:  Positive for diarrhea, nausea and vomiting. Negative for abdominal pain and constipation.  Genitourinary:  Negative for difficulty urinating, dysuria, flank pain and frequency.  Musculoskeletal:  Negative for myalgias.  All other systems reviewed and are negative.  Physical Exam Updated Vital Signs BP (!) 148/92    Pulse 68    Temp 97.9 F (36.6 C) (Oral)    Resp 16    Ht 6' (1.829 m)    Wt 104.3 kg    SpO2 100%    BMI 31.19 kg/m  Physical Exam Vitals and nursing note reviewed.  Constitutional:      Appearance: He is not ill-appearing or diaphoretic.   HENT:     Head: Normocephalic and atraumatic.  Eyes:     Conjunctiva/sclera: Conjunctivae normal.  Cardiovascular:     Rate and Rhythm: Normal rate and regular rhythm.     Pulses: Normal pulses.  Pulmonary:     Effort: Pulmonary effort is normal.     Breath sounds: Normal breath sounds. No wheezing, rhonchi or rales.  Abdominal:     Palpations: Abdomen is soft.     Tenderness: There is no abdominal tenderness. There is no guarding or rebound.     Comments: Soft, NTND, decreased bowel sounds throughout, no r/g/r, neg murphy's, neg mcburney's, no CVA TTP  Musculoskeletal:     Cervical back: Neck supple.  Skin:    General: Skin is warm and dry.  Neurological:     Mental Status: He is alert.    ED Results / Procedures / Treatments   Labs (all labs ordered are listed, but only abnormal results are displayed) Labs Reviewed  COMPREHENSIVE METABOLIC PANEL - Abnormal; Notable for the following components:      Result Value   Glucose, Bld 135 (*)    Calcium 8.6 (*)    All other components within normal limits  URINALYSIS, ROUTINE W REFLEX MICROSCOPIC - Abnormal; Notable for the following components:   Glucose, UA 100 (*)    All other components within normal limits  LIPASE, BLOOD  CBC    EKG None  Radiology No results found.  Procedures Procedures    Medications Ordered in ED Medications  sodium chloride 0.9 % bolus 1,000 mL (0 mLs Intravenous Stopped 01/28/21 1206)  ondansetron (ZOFRAN) injection 4 mg (4 mg Intravenous Given 01/28/21 1057)    ED Course/ Medical Decision Making/ A&P                           Medical Decision Making [age] year old male who presents to the ED today with complaint of nausea, vomiting, diarrhea that began earlier this morning.  No associated abdominal pain.  [facility]  Patient appears to be in no acute distress.  His abdomen is soft and nontender however he does have some decreased bowel sounds throughout.  He  denies constipation or obstipation.  No previous abdominal surgeries.  We will plan for lab work, fluids, antiemetics and reevaluation however given age and symptoms may consider CT scan for further evaluation.  Problems Addressed: Nausea vomiting and diarrhea: acute illness or injury    Details: Labwork unremarkable at this time. Abdomen is soft and nontender. Pt reports improvement in symptoms after antiemetics and fluids. He has been able to tolerate PO here without difficulty.  Suspect viral gastroenteritis at this time. Do not feel pt requires imaging today. Will discharge home with symptomatic treatment and PCP follow up. Pt in agreement with plan and stable for discharge home.  Amount and/or Complexity of Data Reviewed Labs: ordered.    Details: CBC without leukocytosis. Hgb stable at 14.4 CMP with calcium 8.6. No other electrolyte abnormalities. LFTs unremarkable.  Lipase WNL at 26 U/A without signs of infection Radiology: ordered.  Risk Prescription drug management.           Final Clinical Impression(s) / ED Diagnoses Final diagnoses:  Nausea vomiting and diarrhea  Gastroenteritis    Rx / DC Orders ED Discharge Orders          Ordered    ondansetron (ZOFRAN-ODT) 4 MG disintegrating tablet  Every 8 hours PRN        01/28/21 1252    dicyclomine (BENTYL) 20 MG tablet  2 times daily        01/28/21 1252             Discharge Instructions      Please pick up medication and take as needed for symptoms.  Continue drinking plenty of fluids to stay hydrated and rest as much as possible. I would recommend a bland diet for the next few days. Please see attached on same.   Follow up with your PCP for further evaluation of your symptoms  Return to the ED for any new/worsening symptoms       Tanda Rockers, PA-C 01/28/21 1252    Jacalyn Lefevre, MD 01/28/21 1416

## 2021-01-28 NOTE — ED Triage Notes (Signed)
Pt reports nausea, vomiting, and diarrhea that started this morning. Pt reports he had a couple of alcoholic beverages last night that could be causing the symptoms.

## 2021-01-28 NOTE — Discharge Instructions (Signed)
Please pick up medication and take as needed for symptoms.  Continue drinking plenty of fluids to stay hydrated and rest as much as possible. I would recommend a bland diet for the next few days. Please see attached on same.   Follow up with your PCP for further evaluation of your symptoms  Return to the ED for any new/worsening symptoms

## 2021-01-28 NOTE — ED Notes (Signed)
Gave pt ginger ale.  

## 2021-01-30 ENCOUNTER — Other Ambulatory Visit (HOSPITAL_BASED_OUTPATIENT_CLINIC_OR_DEPARTMENT_OTHER): Payer: Self-pay

## 2021-01-31 ENCOUNTER — Ambulatory Visit: Payer: Medicare Other | Admitting: Orthopaedic Surgery

## 2021-02-20 ENCOUNTER — Other Ambulatory Visit: Payer: Self-pay | Admitting: Family Medicine

## 2021-02-20 ENCOUNTER — Telehealth: Payer: Self-pay | Admitting: *Deleted

## 2021-02-20 DIAGNOSIS — E876 Hypokalemia: Secondary | ICD-10-CM

## 2021-02-20 NOTE — Progress Notes (Signed)
cmp

## 2021-02-20 NOTE — Telephone Encounter (Signed)
Pt has lab appointment on 2/16 but I do not see any future lab orders. Scheduling notes say PSA.  Please place future order if appropriate.

## 2021-02-21 NOTE — Telephone Encounter (Signed)
Thank you. Appt notes updated to reflect order as below.   Sharlene Dory, DO to [place], Vladimir Crofts, CMA      4:16 PM  He was referred to urology. Please order CMP to follow up on low Ca. Ty.

## 2021-02-23 ENCOUNTER — Other Ambulatory Visit: Payer: Medicare Other

## 2021-03-01 ENCOUNTER — Other Ambulatory Visit (INDEPENDENT_AMBULATORY_CARE_PROVIDER_SITE_OTHER): Payer: Medicare Other

## 2021-03-01 ENCOUNTER — Encounter (HOSPITAL_BASED_OUTPATIENT_CLINIC_OR_DEPARTMENT_OTHER): Payer: Self-pay

## 2021-03-01 ENCOUNTER — Other Ambulatory Visit: Payer: Self-pay

## 2021-03-01 ENCOUNTER — Emergency Department (HOSPITAL_BASED_OUTPATIENT_CLINIC_OR_DEPARTMENT_OTHER)
Admission: EM | Admit: 2021-03-01 | Discharge: 2021-03-01 | Disposition: A | Discharge status: Home / Self Care | Payer: Medicare Other | Attending: Emergency Medicine | Admitting: Emergency Medicine

## 2021-03-01 DIAGNOSIS — Z79899 Other long term (current) drug therapy: Secondary | ICD-10-CM | POA: Insufficient documentation

## 2021-03-01 DIAGNOSIS — I10 Essential (primary) hypertension: Secondary | ICD-10-CM | POA: Diagnosis not present

## 2021-03-01 DIAGNOSIS — Z7982 Long term (current) use of aspirin: Secondary | ICD-10-CM | POA: Diagnosis not present

## 2021-03-01 DIAGNOSIS — R112 Nausea with vomiting, unspecified: Secondary | ICD-10-CM | POA: Diagnosis not present

## 2021-03-01 DIAGNOSIS — Z7901 Long term (current) use of anticoagulants: Secondary | ICD-10-CM | POA: Diagnosis not present

## 2021-03-01 DIAGNOSIS — R197 Diarrhea, unspecified: Secondary | ICD-10-CM | POA: Insufficient documentation

## 2021-03-01 LAB — LIPASE, BLOOD: Lipase: 26 U/L (ref 11–51)

## 2021-03-01 LAB — CBC WITH DIFFERENTIAL/PLATELET
Abs Immature Granulocytes: 0.01 10*3/uL (ref 0.00–0.07)
Basophils Absolute: 0 10*3/uL (ref 0.0–0.1)
Basophils Relative: 0 %
Eosinophils Absolute: 0 10*3/uL (ref 0.0–0.5)
Eosinophils Relative: 1 %
HCT: 41.7 % (ref 39.0–52.0)
Hemoglobin: 14 g/dL (ref 13.0–17.0)
Immature Granulocytes: 0 %
Lymphocytes Relative: 20 %
Lymphs Abs: 1.1 10*3/uL (ref 0.7–4.0)
MCH: 28.2 pg (ref 26.0–34.0)
MCHC: 33.6 g/dL (ref 30.0–36.0)
MCV: 83.9 fL (ref 80.0–100.0)
Monocytes Absolute: 0.3 10*3/uL (ref 0.1–1.0)
Monocytes Relative: 5 %
Neutro Abs: 3.8 10*3/uL (ref 1.7–7.7)
Neutrophils Relative %: 74 %
Platelets: 146 10*3/uL — ABNORMAL LOW (ref 150–400)
RBC: 4.97 MIL/uL (ref 4.22–5.81)
RDW: 13.6 % (ref 11.5–15.5)
WBC: 5.2 10*3/uL (ref 4.0–10.5)
nRBC: 0 % (ref 0.0–0.2)

## 2021-03-01 LAB — COMPREHENSIVE METABOLIC PANEL
ALT: 15 U/L (ref 0–53)
ALT: 18 U/L (ref 0–44)
AST: 20 U/L (ref 0–37)
AST: 23 U/L (ref 15–41)
Albumin: 4.2 g/dL (ref 3.5–5.2)
Albumin: 3.9 g/dL (ref 3.5–5.0)
Alkaline Phosphatase: 58 U/L (ref 39–117)
Alkaline Phosphatase: 55 U/L (ref 38–126)
Anion gap: 6 (ref 5–15)
BUN: 16 mg/dL (ref 6–23)
BUN: 14 mg/dL (ref 8–23)
CO2: 36 mEq/L — ABNORMAL HIGH (ref 19–32)
CO2: 28 mmol/L (ref 22–32)
Calcium: 8.9 mg/dL (ref 8.4–10.5)
Calcium: 8.5 mg/dL — ABNORMAL LOW (ref 8.9–10.3)
Chloride: 102 mEq/L (ref 96–112)
Chloride: 103 mmol/L (ref 98–111)
Creatinine, Ser: 0.96 mg/dL (ref 0.40–1.50)
Creatinine, Ser: 0.8 mg/dL (ref 0.61–1.24)
GFR, Estimated: >60 mL/min (ref >60)
GFR: 82.77 mL/min (ref >60.00)
Glucose, Bld: 133 mg/dL — ABNORMAL HIGH (ref 70–99)
Glucose, Bld: 148 mg/dL — ABNORMAL HIGH (ref 70–99)
Potassium: 3.6 mEq/L (ref 3.5–5.1)
Potassium: 3.3 mmol/L — ABNORMAL LOW (ref 3.5–5.1)
Sodium: 140 mEq/L (ref 135–145)
Sodium: 137 mmol/L (ref 135–145)
Total Bilirubin: 0.4 mg/dL (ref 0.2–1.2)
Total Bilirubin: 0.7 mg/dL (ref 0.3–1.2)
Total Protein: 6.9 g/dL (ref 6.0–8.3)
Total Protein: 7.1 g/dL (ref 6.5–8.1)

## 2021-03-01 MED ORDER — PANTOPRAZOLE SODIUM 40 MG IV SOLR
40.0000 mg | Freq: Once | INTRAVENOUS | Status: AC
  Administered 2021-03-01: 40 mg via INTRAVENOUS
  Filled 2021-03-01: qty 10

## 2021-03-01 MED ORDER — SODIUM CHLORIDE 0.9 % IV BOLUS
1000.0000 mL | Freq: Once | INTRAVENOUS | Status: AC
  Administered 2021-03-01: 1000 mL via INTRAVENOUS

## 2021-03-01 MED ORDER — ONDANSETRON HCL 4 MG/2ML IJ SOLN
4.0000 mg | Freq: Once | INTRAMUSCULAR | Status: AC
  Administered 2021-03-01: 4 mg via INTRAVENOUS
  Filled 2021-03-01: qty 2

## 2021-03-01 NOTE — ED Triage Notes (Signed)
Pt arrives with c/o vomiting starting around 630 am today denies being around anyone who is ill. Pt reports eating fish last night, states he started having pain in his stomach after eating. Also having diarrhea. Pt reports 3 episodes of vomiting and diarrhea. Pt reports taking Zofran at home PTA.

## 2021-03-01 NOTE — ED Provider Notes (Signed)
[facility] EMERGENCY DEPARTMENT Provider Note   CSN: [id number] Arrival date & time: 03/01/21  1016     History  Chief Complaint  Patient presents with   Emesis    Vincent [name] is a 66 y.o. male.  The history is provided by the patient.  Emesis Severity:  Mild Timing:  Intermittent Able to tolerate:  Solids and liquids Progression:  Unchanged Chronicity:  New Recent urination:  Normal Relieved by:  Nothing Worsened by:  Nothing Associated symptoms: diarrhea and headaches   Associated symptoms: no abdominal pain, no arthralgias, no chills, no cough, no myalgias, no sore throat and no URI   Risk factors: suspect food intake (ate fish last night that he thinks made him sick)       Home Medications Prior to Admission medications   Medication Sig Start Date End Date Taking? Authorizing Provider  acetaminophen (TYLENOL 8 HOUR) 650 MG CR tablet Take 1 tablet (650 mg total) by mouth every 8 (eight) hours as needed for pain. 10/18/20   Debbrah Alar, NP  amLODipine (NORVASC) 10 MG tablet TAKE 1 TABLET BY MOUTH DAILY 03/04/20 03/04/21  Azzie Glatter, FNP  aspirin 81 MG chewable tablet Chew 1 tablet (81 mg total) by mouth daily. 01/25/17   Scot Jun, FNP  atorvastatin (LIPITOR) 80 MG tablet TAKE 1 TABLET BY MOUTH DAILY AT 6PM 07/04/20 07/04/21  Shelda Pal, DO  clopidogrel (PLAVIX) 75 MG tablet TAKE 1 TABLET BY MOUTH ONCE DAILY 03/04/20 03/04/21  Azzie Glatter, FNP  colchicine 0.6 MG tablet Take 2 tabs by mouth now and then repeat in 1 hour. 10/20/20   Debbrah Alar, NP  dicyclomine (BENTYL) 20 MG tablet Take 1 tablet (20 mg total) by mouth 2 (two) times daily. 01/28/21   Alroy Bailiff, Margaux, PA-C  losartan (COZAAR) 100 MG tablet Take 1 tablet (100 mg total) by mouth daily. 11/29/20   Shelda Pal, DO  meclizine (ANTIVERT) 25 MG tablet Take 1 tablet (25 mg total) by mouth 3 (three) times daily as needed for dizziness. [id number]    Delora Fuel, MD  metoprolol tartrate (LOPRESSOR) 25 MG tablet TAKE 1/2 TABLETS BY MOUTH 2 TIMES DAILY 03/04/20 03/04/21  Azzie Glatter, FNP  nitroGLYCERIN (NITROSTAT) 0.4 MG SL tablet PLACE 1 TABLET UNDER THE TONGUE EVERY 5 MINUTES AS NEEDED 07/20/20 07/20/21  Shelda Pal, DO  ondansetron (ZOFRAN-ODT) 4 MG disintegrating tablet Take 1 tablet (4 mg total) by mouth every 8 (eight) hours as needed for nausea or vomiting. 01/28/21   Alroy Bailiff, Margaux, PA-C  oxybutynin (DITROPAN XL) 5 MG 24 hr tablet Take 1 tablet (5 mg total) by mouth at bedtime. 10/18/20   Debbrah Alar, NP  potassium chloride SA (KLOR-CON) 20 MEQ tablet Take 1 tablet (20 mEq total) by mouth 2 (two) times daily. [id number]   Delora Fuel, MD      Allergies    Patient has no known allergies.    Review of Systems   Review of Systems  Constitutional:  Negative for chills.  HENT:  Negative for sore throat.   Respiratory:  Negative for cough.   Gastrointestinal:  Positive for diarrhea and vomiting. Negative for abdominal pain.  Musculoskeletal:  Negative for arthralgias and myalgias.  Neurological:  Positive for headaches.   Physical Exam Updated Vital Signs BP (S) (!) 144/67 (BP Location: Right Arm) Comment: pt took medication for BP but reports he vomited after   Pulse 70    Temp 98 F (36.7 C) (  Oral)    Resp 15    Ht 6' (1.829 m)    Wt 104.3 kg    SpO2 97%    BMI 31.19 kg/m  Physical Exam Vitals and nursing note reviewed.  Constitutional:      General: He is not in acute distress.    Appearance: He is well-developed. He is not ill-appearing.  HENT:     Head: Normocephalic and atraumatic.  Eyes:     Extraocular Movements: Extraocular movements intact.     Conjunctiva/sclera: Conjunctivae normal.     Pupils: Pupils are equal, round, and reactive to light.  Cardiovascular:     Rate and Rhythm: Normal rate and regular rhythm.     Pulses: Normal pulses.     Heart sounds: Normal heart sounds. No murmur  heard. Pulmonary:     Effort: Pulmonary effort is normal. No respiratory distress.     Breath sounds: Normal breath sounds.  Abdominal:     General: There is no distension.     Palpations: Abdomen is soft.     Tenderness: There is no abdominal tenderness.  Musculoskeletal:        General: No swelling.     Cervical back: Neck supple.  Skin:    General: Skin is warm and dry.     Capillary Refill: Capillary refill takes less than 2 seconds.  Neurological:     General: No focal deficit present.     Mental Status: He is alert.  Psychiatric:        Mood and Affect: Mood normal.    ED Results / Procedures / Treatments   Labs (all labs ordered are listed, but only abnormal results are displayed) Labs Reviewed  CBC WITH DIFFERENTIAL/PLATELET - Abnormal; Notable for the following components:      Result Value   Platelets 146 (*)    All other components within normal limits  COMPREHENSIVE METABOLIC PANEL - Abnormal; Notable for the following components:   Potassium 3.3 (*)    Glucose, Bld 148 (*)    Calcium 8.5 (*)    All other components within normal limits  LIPASE, BLOOD    EKG EKG Interpretation  Date/Time:  Wednesday March 01 2021 10:36:04 EST Ventricular Rate:  66 PR Interval:  169 QRS Duration: 94 QT Interval:  418 QTC Calculation: 438 R Axis:   14 Text Interpretation: Sinus rhythm Confirmed by Ronnald Nian, Imraan Wendell ([id number]) on 03/01/2021 10:36:28 AM  Radiology No results found.  Procedures Procedures    Medications Ordered in ED Medications  sodium chloride 0.9 % bolus 1,000 mL (1,000 mLs Intravenous New Bag/Given 03/01/21 1059)  ondansetron (ZOFRAN) injection 4 mg (4 mg Intravenous Given 03/01/21 1059)  pantoprazole (PROTONIX) injection 40 mg (40 mg Intravenous Given 03/01/21 1100)    ED Course/ Medical Decision Making/ A&P                           Medical Decision Making Amount and/or Complexity of Data Reviewed Labs: ordered.  Risk Prescription drug  management.   Vincent [name] is here with nausea, vomiting, diarrhea.  EKG done in triage shows sinus rhythm.  No ischemic changes.  Denies any chest pain or shortness of breath.  Vital signs overall unremarkable.  No fever.  States that he ate fish last night that he thinks has caused his symptoms this morning.  He has had several episodes of nausea vomiting diarrhea.  Nonbloody episodes.  He has abdominal cramping at times but  has no abdominal pain currently.  No abdominal tenderness on exam.  Differential diagnosis includes viral gastroenteritis, foodborne illness, less likely pancreatitis or cholecystitis or hepatitis.  Have no concern for acute coronary syndrome.  We will give IV fluids, IV Zofran and reevaluate.  No concern for other intra-abdominal infection at this time including appendicitis.  Could have a colitis from this illness but he appears comfortable at this time.  We will give a dose IV Protonix for heartburn symptoms as well.  Per my review and interpretation of labs patient has no significant anemia, electrolyte abnormality, and kidney injury.  Gallbladder and liver enzymes and lipase all within normal limits.  No concern for cholecystitis or pancreatitis or hepatitis.  No severe dehydration or electrolyte issue.  Overall suspect foodborne illness.  Patient has Zofran at home.  Recommend hydration at home.  Understands return precautions and discharged in ED in good condition.  This chart was dictated using voice recognition software.  Despite best efforts to proofread,  errors can occur which can change the documentation meaning.         Final Clinical Impression(s) / ED Diagnoses Final diagnoses:  Nausea vomiting and diarrhea    Rx / DC Orders ED Discharge Orders     None         Lennice Sites, DO 03/01/21 1140

## 2021-05-17 ENCOUNTER — Ambulatory Visit (INDEPENDENT_AMBULATORY_CARE_PROVIDER_SITE_OTHER): Payer: Medicare Other | Admitting: Family Medicine

## 2021-05-17 ENCOUNTER — Encounter: Payer: Self-pay | Admitting: Family Medicine

## 2021-05-17 ENCOUNTER — Other Ambulatory Visit (HOSPITAL_BASED_OUTPATIENT_CLINIC_OR_DEPARTMENT_OTHER): Payer: Self-pay

## 2021-05-17 VITALS — BP 152/88 | HR 68 | Temp 98.0°F | Ht 71.5 in | Wt 232.1 lb

## 2021-05-17 DIAGNOSIS — R310 Gross hematuria: Secondary | ICD-10-CM | POA: Diagnosis not present

## 2021-05-17 DIAGNOSIS — I10 Essential (primary) hypertension: Secondary | ICD-10-CM | POA: Diagnosis not present

## 2021-05-17 DIAGNOSIS — M5416 Radiculopathy, lumbar region: Secondary | ICD-10-CM | POA: Diagnosis not present

## 2021-05-17 DIAGNOSIS — R079 Chest pain, unspecified: Secondary | ICD-10-CM | POA: Diagnosis not present

## 2021-05-17 LAB — URINALYSIS, MICROSCOPIC ONLY: RBC / HPF: NONE SEEN (ref 0–?)

## 2021-05-17 MED ORDER — HYDROCHLOROTHIAZIDE 25 MG PO TABS
25.0000 mg | ORAL_TABLET | Freq: Every day | ORAL | 3 refills | Status: DC
  Filled 2021-05-17: qty 90, 90d supply, fill #0

## 2021-05-17 NOTE — Patient Instructions (Addendum)
Give [place] 2-3 business days to get the results of your labs back.  ? ?If you do not hear anything about your referrals in the next 1-2 weeks, call our office and ask for an update. ? ?Take it easy physically. ? ?Let us know if you need anything. ? ?
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
## 2021-05-19 ENCOUNTER — Other Ambulatory Visit (HOSPITAL_BASED_OUTPATIENT_CLINIC_OR_DEPARTMENT_OTHER): Payer: Self-pay

## 2021-05-19 ENCOUNTER — Other Ambulatory Visit: Payer: Self-pay | Admitting: Family Medicine

## 2021-05-19 DIAGNOSIS — I10 Essential (primary) hypertension: Secondary | ICD-10-CM

## 2021-05-19 MED ORDER — POTASSIUM CHLORIDE CRYS ER 20 MEQ PO TBCR
20.0000 meq | EXTENDED_RELEASE_TABLET | Freq: Two times a day (BID) | ORAL | 0 refills | Status: DC
  Filled 2021-05-19: qty 10, 5d supply, fill #0

## 2021-05-19 MED ORDER — METOPROLOL TARTRATE 25 MG PO TABS
ORAL_TABLET | ORAL | 3 refills | Status: DC
  Filled 2021-05-19: qty 90, 90d supply, fill #0

## 2021-05-19 MED ORDER — AMLODIPINE BESYLATE 10 MG PO TABS
ORAL_TABLET | Freq: Every day | ORAL | 3 refills | Status: DC
  Filled 2021-05-19: qty 90, 90d supply, fill #0

## 2021-05-19 MED ORDER — CLOPIDOGREL BISULFATE 75 MG PO TABS
ORAL_TABLET | Freq: Every day | ORAL | 3 refills | Status: AC
  Filled 2021-05-19: qty 90, 90d supply, fill #0
  Filled 2021-08-31: qty 90, 90d supply, fill #1
  Filled 2021-12-14: qty 90, 90d supply, fill #2
  Filled 2022-03-14: qty 90, 90d supply, fill #3

## 2021-05-22 ENCOUNTER — Other Ambulatory Visit (HOSPITAL_BASED_OUTPATIENT_CLINIC_OR_DEPARTMENT_OTHER): Payer: Self-pay

## 2021-05-31 ENCOUNTER — Telehealth: Payer: Self-pay | Admitting: Family Medicine

## 2021-05-31 DIAGNOSIS — M5416 Radiculopathy, lumbar region: Secondary | ICD-10-CM

## 2021-05-31 NOTE — Telephone Encounter (Signed)
Had to Copy/past message from his significant other chart where she had sent a my chart message from her chart regarding him.    Dr Nicki Reaper you scheduled an appointment with Vincent [name]'s supposed to been giving him a second opinion on his back surgery but you sent him to the same doctor that he went to this morning so he asked me to reach out to you and see if you can find him a second opinion with someone else beside doctor murlock    PCP stated to refer to Neurosurgery at [facility].

## 2021-06-08 ENCOUNTER — Telehealth: Payer: Self-pay | Admitting: Family Medicine

## 2021-06-08 NOTE — Telephone Encounter (Signed)
Left message for patient to call back and schedule Medicare Annual Wellness Visit (AWV).   Please offer to do virtually or by telephone.  Left office number and my jabber #[phone].  AWVI eligible as of 05/08/2021  Please schedule at anytime with Nurse Health Advisor.   

## 2021-07-13 ENCOUNTER — Ambulatory Visit: Payer: Medicare Other | Admitting: Cardiology

## 2021-07-26 ENCOUNTER — Encounter: Payer: Self-pay | Admitting: Family Medicine

## 2021-07-26 ENCOUNTER — Other Ambulatory Visit (HOSPITAL_BASED_OUTPATIENT_CLINIC_OR_DEPARTMENT_OTHER): Payer: Self-pay

## 2021-07-26 ENCOUNTER — Ambulatory Visit: Payer: Medicare Other | Admitting: Family Medicine

## 2021-07-26 ENCOUNTER — Ambulatory Visit (INDEPENDENT_AMBULATORY_CARE_PROVIDER_SITE_OTHER): Payer: Medicare Other | Admitting: Family Medicine

## 2021-07-26 VITALS — BP 140/86 | HR 72 | Temp 98.0°F | Ht 71.5 in | Wt 233.5 lb

## 2021-07-26 DIAGNOSIS — L0292 Furuncle, unspecified: Secondary | ICD-10-CM | POA: Diagnosis not present

## 2021-07-26 DIAGNOSIS — E876 Hypokalemia: Secondary | ICD-10-CM

## 2021-07-26 DIAGNOSIS — R42 Dizziness and giddiness: Secondary | ICD-10-CM

## 2021-07-26 DIAGNOSIS — M5416 Radiculopathy, lumbar region: Secondary | ICD-10-CM | POA: Diagnosis not present

## 2021-07-26 MED ORDER — AMLODIPINE-VALSARTAN-HCTZ 10-320-25 MG PO TABS
1.0000 | ORAL_TABLET | Freq: Every day | ORAL | 1 refills | Status: DC
  Filled 2021-07-26: qty 90, 90d supply, fill #0
  Filled 2021-08-31 – 2021-10-30 (×2): qty 90, 90d supply, fill #1

## 2021-07-26 MED ORDER — DICLOFENAC SODIUM 1 % EX GEL
2.0000 g | Freq: Four times a day (QID) | CUTANEOUS | 1 refills | Status: DC
  Filled 2021-07-26: qty 100, 19d supply, fill #0
  Filled 2021-08-31: qty 100, 13d supply, fill #0
  Filled 2021-10-30: qty 100, 13d supply, fill #1

## 2021-07-26 NOTE — Patient Instructions (Addendum)
Consider Voltaren gel on the back/leg up to 4 times daily as needed for pain.  Ice/cold pack over area for 10-15 min twice daily.  Heat (pad or rice pillow in microwave) over affected area, 10-15 minutes twice daily.   Put some Neosporin or triple antibiotic ointment on it twice daily for the next 7-10 days. Let me know if things worsne.   Let us know if you need anything.
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
## 2021-07-27 ENCOUNTER — Other Ambulatory Visit: Payer: Self-pay | Admitting: Family Medicine

## 2021-07-27 ENCOUNTER — Other Ambulatory Visit (HOSPITAL_BASED_OUTPATIENT_CLINIC_OR_DEPARTMENT_OTHER): Payer: Self-pay

## 2021-07-27 DIAGNOSIS — M5416 Radiculopathy, lumbar region: Secondary | ICD-10-CM

## 2021-07-27 DIAGNOSIS — M109 Gout, unspecified: Secondary | ICD-10-CM | POA: Insufficient documentation

## 2021-07-28 ENCOUNTER — Other Ambulatory Visit (HOSPITAL_BASED_OUTPATIENT_CLINIC_OR_DEPARTMENT_OTHER): Payer: Self-pay

## 2021-08-02 ENCOUNTER — Other Ambulatory Visit (HOSPITAL_BASED_OUTPATIENT_CLINIC_OR_DEPARTMENT_OTHER): Payer: Self-pay

## 2021-08-02 ENCOUNTER — Ambulatory Visit: Payer: Medicare Other | Attending: Family Medicine | Admitting: Physical Therapy

## 2021-08-02 ENCOUNTER — Encounter: Payer: Self-pay | Admitting: Physical Therapy

## 2021-08-02 DIAGNOSIS — H8112 Benign paroxysmal vertigo, left ear: Secondary | ICD-10-CM | POA: Diagnosis not present

## 2021-08-02 DIAGNOSIS — R42 Dizziness and giddiness: Secondary | ICD-10-CM | POA: Diagnosis not present
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
## 2021-08-03 ENCOUNTER — Encounter: Payer: Self-pay | Admitting: Cardiology

## 2021-08-03 ENCOUNTER — Ambulatory Visit (INDEPENDENT_AMBULATORY_CARE_PROVIDER_SITE_OTHER): Payer: Medicare Other | Admitting: Cardiology

## 2021-08-03 ENCOUNTER — Other Ambulatory Visit (HOSPITAL_BASED_OUTPATIENT_CLINIC_OR_DEPARTMENT_OTHER): Payer: Self-pay

## 2021-08-03 VITALS — BP 154/82 | HR 75 | Ht 71.6 in | Wt 225.1 lb

## 2021-08-03 DIAGNOSIS — I10 Essential (primary) hypertension: Secondary | ICD-10-CM | POA: Diagnosis not present

## 2021-08-03 DIAGNOSIS — I25118 Atherosclerotic heart disease of native coronary artery with other forms of angina pectoris: Secondary | ICD-10-CM | POA: Diagnosis not present

## 2021-08-03 DIAGNOSIS — E782 Mixed hyperlipidemia: Secondary | ICD-10-CM | POA: Diagnosis not present

## 2021-08-03 DIAGNOSIS — R072 Precordial pain: Secondary | ICD-10-CM

## 2021-08-03 MED ORDER — METOPROLOL TARTRATE 100 MG PO TABS
100.0000 mg | ORAL_TABLET | Freq: Once | ORAL | 0 refills | Status: DC
  Filled 2021-08-03: qty 1, 1d supply, fill #0

## 2021-08-03 MED ORDER — NITROGLYCERIN 0.4 MG SL SUBL
0.4000 mg | SUBLINGUAL_TABLET | SUBLINGUAL | 3 refills | Status: DC | PRN
  Filled 2021-08-03: qty 25, 7d supply, fill #0

## 2021-08-03 MED ORDER — DILTIAZEM HCL ER COATED BEADS 240 MG PO CP24
240.0000 mg | ORAL_CAPSULE | Freq: Every day | ORAL | 3 refills | Status: DC
  Filled 2021-08-03: qty 90, 90d supply, fill #0
  Filled 2021-12-14: qty 90, 90d supply, fill #1
  Filled 2022-03-14: qty 90, 90d supply, fill #2
  Filled 2022-07-17 (×2): qty 90, 90d supply, fill #3

## 2021-08-03 NOTE — Patient Instructions (Addendum)
Medication Instructions:  Your physician has recommended you make the following change in your medication:   START: Nitroglycerin 0.4 mg under the tongue every 5 minutes as needed for chest pain START: Cardizem CD 240 mg daily STOP: Metoprolol  *If you need a refill on your cardiac medications before your next appointment, please call your pharmacy*   Lab Work: Your physician recommends that you return for lab work in:   Labs 1 week before CT: BMP  If you have labs (blood work) drawn today and your tests are completely normal, you will receive your results only by: MyChart Message (if you have MyChart) OR A paper copy in the mail If you have any lab test that is abnormal or we need to change your treatment, we will call you to review the results.   Testing/Procedures:   Your cardiac CT will be scheduled at one of the below locations:   [facility] [address] [place], [place] [address] [phone]  OR  [facility] [address] [place], [place] [address] [phone]  If scheduled at [facility], please arrive at the [facility] and Children's Entrance (Entrance C2) of [facility] 30 minutes prior to test start time. You can use the FREE valet parking offered at entrance C (encouraged to control the heart rate for the test)  Proceed to the [facility] Radiology Department (first floor) to check-in and test prep.  All radiology patients and guests should use entrance C2 at [facility], accessed from [facility], even though the hospital's physical address listed is [address].    If scheduled at [facility], please arrive 15 mins early for check-in and test prep.  Please follow these instructions carefully (unless otherwise directed):  On the Night Before the Test: Be sure to Drink plenty of water. Do not consume any  caffeinated/decaffeinated beverages or chocolate 12 hours prior to your test. Do not take any antihistamines 12 hours prior to your test.  On the Day of the Test: Drink plenty of water until 1 hour prior to the test. Do not eat any food 4 hours prior to the test. You may take your regular medications prior to the test.  Take metoprolol (Lopressor) two hours prior to test.      After the Test: Drink plenty of water. After receiving IV contrast, you may experience a mild flushed feeling. This is normal. On occasion, you may experience a mild rash up to 24 hours after the test. This is not dangerous. If this occurs, you can take Benadryl 25 mg and increase your fluid intake. If you experience trouble breathing, this can be serious. If it is severe call 911 IMMEDIATELY. If it is mild, please call our office.  We will call to schedule your test 2-4 weeks out understanding that some insurance companies will need an authorization prior to the service being performed.   For non-scheduling related questions, please contact the cardiac imaging nurse navigator should you have any questions/concerns: Rockwell Alexandria, Cardiac Imaging Nurse Navigator Larey Brick, Cardiac Imaging Nurse Navigator [facility] Direct Office Dial: [phone]   For scheduling needs, including cancellations and rescheduling, please call [place], [phone].    Follow-Up: At [facility], you and your health needs are our priority.  As part of our continuing mission to provide you with exceptional heart care, we have created designated Provider Care Teams.  These Care  Teams include your primary Cardiologist (physician) and Advanced Practice Providers (APPs -  Physician Assistants and Nurse Practitioners) who all work together to provide you with the care you need, when you need it.  We recommend signing up for the patient portal called "MyChart".  Sign up information is provided on this  After Visit Summary.  MyChart is used to connect with patients for Virtual Visits (Telemedicine).  Patients are able to view lab/test results, encounter notes, upcoming appointments, etc.  Non-urgent messages can be sent to your provider as well.   To learn more about what you can do with MyChart, go to [email or web address].    Your next appointment:   3 month(s)  The format for your next appointment:   In Person  Provider:   Norman Herrlich, MD    Other Instructions None  Important Information About Sugar

## 2021-08-08 NOTE — Addendum Note (Signed)
Addended by: Jena Gauss on: 08/08/2021 01:24 PM   Modules accepted: Orders

## 2021-08-09 ENCOUNTER — Encounter: Payer: Self-pay | Admitting: Physical Therapy

## 2021-08-09 ENCOUNTER — Ambulatory Visit: Payer: Medicare Other | Attending: Family Medicine | Admitting: Physical Therapy

## 2021-08-09 DIAGNOSIS — R42 Dizziness and giddiness: Secondary | ICD-10-CM | POA: Diagnosis not present

## 2021-08-09 DIAGNOSIS — H8112 Benign paroxysmal vertigo, left ear: Secondary | ICD-10-CM | POA: Diagnosis not present
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
## 2021-08-10 ENCOUNTER — Ambulatory Visit: Payer: Medicare Other | Admitting: Physical Therapy

## 2021-08-10 ENCOUNTER — Encounter: Payer: Self-pay | Admitting: Physical Therapy

## 2021-08-10 DIAGNOSIS — H8112 Benign paroxysmal vertigo, left ear: Secondary | ICD-10-CM | POA: Diagnosis not present

## 2021-08-10 DIAGNOSIS — R42 Dizziness and giddiness: Secondary | ICD-10-CM | POA: Diagnosis not present
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
## 2021-08-11 ENCOUNTER — Encounter: Payer: Medicaid Other | Admitting: Physical Therapy

## 2021-08-11 ENCOUNTER — Ambulatory Visit: Payer: Medicare Other | Admitting: Physical Therapy

## 2021-08-11 ENCOUNTER — Encounter: Payer: Self-pay | Admitting: Physical Therapy

## 2021-08-11 DIAGNOSIS — H8112 Benign paroxysmal vertigo, left ear: Secondary | ICD-10-CM

## 2021-08-11 DIAGNOSIS — R42 Dizziness and giddiness: Secondary | ICD-10-CM | POA: Diagnosis not present
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
## 2021-08-16 ENCOUNTER — Ambulatory Visit: Payer: Medicare Other | Admitting: Physical Therapy

## 2021-08-18 ENCOUNTER — Other Ambulatory Visit (HOSPITAL_BASED_OUTPATIENT_CLINIC_OR_DEPARTMENT_OTHER): Payer: Self-pay

## 2021-08-22 ENCOUNTER — Telehealth: Payer: Self-pay | Admitting: Family Medicine

## 2021-08-22 NOTE — Telephone Encounter (Signed)
Left message for patient to call back and schedule Medicare Annual Wellness Visit (AWV).   Please offer to do virtually or by telephone.  Left office number and my jabber #[phone].  AWVI eligible as of 05/08/2021  Please schedule at anytime with Nurse Health Advisor.   

## 2021-08-23 ENCOUNTER — Encounter: Payer: Medicaid Other | Admitting: Physical Therapy

## 2021-08-30 ENCOUNTER — Encounter: Payer: Medicaid Other | Admitting: Physical Therapy

## 2021-08-31 ENCOUNTER — Other Ambulatory Visit (HOSPITAL_BASED_OUTPATIENT_CLINIC_OR_DEPARTMENT_OTHER): Payer: Self-pay

## 2021-08-31 ENCOUNTER — Other Ambulatory Visit: Payer: Self-pay | Admitting: Cardiology

## 2021-09-01 ENCOUNTER — Other Ambulatory Visit (HOSPITAL_BASED_OUTPATIENT_CLINIC_OR_DEPARTMENT_OTHER): Payer: Self-pay

## 2021-09-06 ENCOUNTER — Encounter: Payer: Medicaid Other | Admitting: Physical Therapy

## 2021-09-13 ENCOUNTER — Encounter: Payer: Medicaid Other | Admitting: Physical Therapy

## 2021-10-03 ENCOUNTER — Other Ambulatory Visit (HOSPITAL_BASED_OUTPATIENT_CLINIC_OR_DEPARTMENT_OTHER): Payer: Self-pay

## 2021-10-03 ENCOUNTER — Other Ambulatory Visit: Payer: Self-pay | Admitting: Family

## 2021-10-03 MED ORDER — COLCHICINE 0.6 MG PO TABS
ORAL_TABLET | ORAL | 0 refills | Status: DC
  Filled 2021-10-03: qty 30, 28d supply, fill #0

## 2021-10-06 ENCOUNTER — Telehealth: Payer: Self-pay | Admitting: Family Medicine

## 2021-10-06 NOTE — Telephone Encounter (Signed)
Plz sched DM visit. Plan on [place] drawing blood and checking a urine sample for protein. Ty.

## 2021-10-06 NOTE — Telephone Encounter (Signed)
Called and scheduled OV with PCP

## 2021-10-06 NOTE — Telephone Encounter (Signed)
Patient received an automated call call regarding his A1C. Please advise.

## 2021-10-10 ENCOUNTER — Encounter: Payer: Self-pay | Admitting: Family Medicine

## 2021-10-10 ENCOUNTER — Ambulatory Visit (INDEPENDENT_AMBULATORY_CARE_PROVIDER_SITE_OTHER): Payer: Medicare Other | Admitting: Family Medicine

## 2021-10-10 ENCOUNTER — Other Ambulatory Visit (HOSPITAL_BASED_OUTPATIENT_CLINIC_OR_DEPARTMENT_OTHER): Payer: Self-pay

## 2021-10-10 VITALS — BP 110/72 | HR 68 | Temp 98.6°F | Ht 71.5 in | Wt 227.2 lb

## 2021-10-10 DIAGNOSIS — I10 Essential (primary) hypertension: Secondary | ICD-10-CM

## 2021-10-10 DIAGNOSIS — E669 Obesity, unspecified: Secondary | ICD-10-CM | POA: Diagnosis not present

## 2021-10-10 DIAGNOSIS — B353 Tinea pedis: Secondary | ICD-10-CM | POA: Diagnosis not present

## 2021-10-10 DIAGNOSIS — E1169 Type 2 diabetes mellitus with other specified complication: Secondary | ICD-10-CM | POA: Diagnosis not present

## 2021-10-10 MED ORDER — KETOCONAZOLE 2 % EX CREA
1.0000 | TOPICAL_CREAM | Freq: Every day | CUTANEOUS | 0 refills | Status: AC
  Filled 2021-10-10: qty 30, 30d supply, fill #0

## 2021-10-10 MED ORDER — OXYBUTYNIN CHLORIDE ER 5 MG PO TB24
5.0000 mg | ORAL_TABLET | Freq: Every day | ORAL | 2 refills | Status: DC
  Filled 2021-10-10: qty 90, 90d supply, fill #0
  Filled 2021-10-30 – 2022-01-22 (×2): qty 90, 90d supply, fill #1
  Filled 2022-05-09: qty 90, 90d supply, fill #2

## 2021-10-10 NOTE — Patient Instructions (Signed)
Give [place] 2-3 business days to get the results of your labs back.   Keep the diet clean and stay active.  Let us know if you change your mind regarding the flu shot.   Let us know if you need anything.
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
## 2021-10-11 ENCOUNTER — Other Ambulatory Visit (HOSPITAL_BASED_OUTPATIENT_CLINIC_OR_DEPARTMENT_OTHER): Payer: Self-pay

## 2021-10-11 ENCOUNTER — Other Ambulatory Visit: Payer: Self-pay | Admitting: Family Medicine

## 2021-10-11 DIAGNOSIS — I10 Essential (primary) hypertension: Secondary | ICD-10-CM

## 2021-10-11 DIAGNOSIS — E785 Hyperlipidemia, unspecified: Secondary | ICD-10-CM

## 2021-10-11 DIAGNOSIS — E876 Hypokalemia: Secondary | ICD-10-CM

## 2021-10-11 LAB — LDL CHOLESTEROL, DIRECT: Direct LDL: 136 mg/dL

## 2021-10-11 LAB — LIPID PANEL
Cholesterol: 205 mg/dL — ABNORMAL HIGH (ref 0–200)
HDL: 45.7 mg/dL (ref >39.00)
NonHDL: 159.06
Total CHOL/HDL Ratio: 4
Triglycerides: 242 mg/dL — ABNORMAL HIGH (ref 0.0–149.0)
VLDL: 48.4 mg/dL — ABNORMAL HIGH (ref 0.0–40.0)

## 2021-10-11 LAB — COMPREHENSIVE METABOLIC PANEL
ALT: 15 U/L (ref 0–53)
AST: 21 U/L (ref 0–37)
Albumin: 4.1 g/dL (ref 3.5–5.2)
Alkaline Phosphatase: 52 U/L (ref 39–117)
BUN: 16 mg/dL (ref 6–23)
CO2: 32 mEq/L (ref 19–32)
Calcium: 9.5 mg/dL (ref 8.4–10.5)
Chloride: 100 mEq/L (ref 96–112)
Creatinine, Ser: 1.06 mg/dL (ref 0.40–1.50)
GFR: 73.18 mL/min (ref >60.00)
Glucose, Bld: 141 mg/dL — ABNORMAL HIGH (ref 70–99)
Potassium: 3.3 mEq/L — ABNORMAL LOW (ref 3.5–5.1)
Sodium: 141 mEq/L (ref 135–145)
Total Bilirubin: 0.4 mg/dL (ref 0.2–1.2)
Total Protein: 7 g/dL (ref 6.0–8.3)

## 2021-10-11 LAB — MICROALBUMIN / CREATININE URINE RATIO
Creatinine,U: 239.4 mg/dL
Microalb Creat Ratio: 0.7 mg/g (ref 0.0–30.0)
Microalb, Ur: 1.7 mg/dL (ref 0.0–1.9)

## 2021-10-11 LAB — HEMOGLOBIN A1C: Hgb A1c MFr Bld: 6.3 % (ref 4.6–6.5)

## 2021-10-11 MED ORDER — POTASSIUM CHLORIDE ER 10 MEQ PO TBCR
10.0000 meq | EXTENDED_RELEASE_TABLET | Freq: Every day | ORAL | 0 refills | Status: DC
  Filled 2021-10-11: qty 30, 30d supply, fill #0

## 2021-10-25 ENCOUNTER — Other Ambulatory Visit (INDEPENDENT_AMBULATORY_CARE_PROVIDER_SITE_OTHER): Payer: Medicare Other

## 2021-10-25 DIAGNOSIS — I10 Essential (primary) hypertension: Secondary | ICD-10-CM

## 2021-10-25 DIAGNOSIS — E876 Hypokalemia: Secondary | ICD-10-CM

## 2021-10-25 DIAGNOSIS — E785 Hyperlipidemia, unspecified: Secondary | ICD-10-CM

## 2021-10-25 LAB — BASIC METABOLIC PANEL
BUN: 14 mg/dL (ref 6–23)
CO2: 30 mEq/L (ref 19–32)
Calcium: 9 mg/dL (ref 8.4–10.5)
Chloride: 103 mEq/L (ref 96–112)
Creatinine, Ser: 0.94 mg/dL (ref 0.40–1.50)
GFR: 84.5 mL/min (ref >60.00)
Glucose, Bld: 104 mg/dL — ABNORMAL HIGH (ref 70–99)
Potassium: 3.9 mEq/L (ref 3.5–5.1)
Sodium: 140 mEq/L (ref 135–145)

## 2021-10-25 LAB — LIPID PANEL
Cholesterol: 227 mg/dL — ABNORMAL HIGH (ref 0–200)
HDL: 50.4 mg/dL (ref >39.00)
LDL Cholesterol: 165 mg/dL — ABNORMAL HIGH (ref 0–99)
NonHDL: 177.05
Total CHOL/HDL Ratio: 5
Triglycerides: 61 mg/dL (ref 0.0–149.0)
VLDL: 12.2 mg/dL (ref 0.0–40.0)

## 2021-10-25 LAB — MAGNESIUM: Magnesium: 2.1 mg/dL (ref 1.5–2.5)

## 2021-10-30 ENCOUNTER — Other Ambulatory Visit (HOSPITAL_BASED_OUTPATIENT_CLINIC_OR_DEPARTMENT_OTHER): Payer: Self-pay

## 2021-10-30 ENCOUNTER — Other Ambulatory Visit: Payer: Self-pay | Admitting: Cardiology

## 2021-10-30 ENCOUNTER — Other Ambulatory Visit: Payer: Self-pay | Admitting: Family Medicine

## 2021-10-30 MED ORDER — COLCHICINE 0.6 MG PO TABS
ORAL_TABLET | ORAL | 0 refills | Status: DC
  Filled 2021-10-30: qty 9, 10d supply, fill #0
  Filled 2021-10-30: qty 21, 18d supply, fill #0

## 2021-10-31 ENCOUNTER — Other Ambulatory Visit (HOSPITAL_BASED_OUTPATIENT_CLINIC_OR_DEPARTMENT_OTHER): Payer: Self-pay

## 2021-10-31 ENCOUNTER — Other Ambulatory Visit: Payer: Self-pay | Admitting: Cardiology

## 2021-11-08 ENCOUNTER — Ambulatory Visit: Payer: Medicare Other | Admitting: Cardiology

## 2021-11-30 ENCOUNTER — Emergency Department (HOSPITAL_BASED_OUTPATIENT_CLINIC_OR_DEPARTMENT_OTHER)
Admission: EM | Admit: 2021-11-30 | Discharge: 2021-11-30 | Disposition: A | Discharge status: Home / Self Care | Payer: Medicare Other | Attending: Emergency Medicine | Admitting: Emergency Medicine

## 2021-11-30 ENCOUNTER — Other Ambulatory Visit: Payer: Self-pay

## 2021-11-30 ENCOUNTER — Encounter (HOSPITAL_BASED_OUTPATIENT_CLINIC_OR_DEPARTMENT_OTHER): Payer: Self-pay | Admitting: Emergency Medicine

## 2021-11-30 DIAGNOSIS — Z20822 Contact with and (suspected) exposure to covid-19: Secondary | ICD-10-CM | POA: Diagnosis not present

## 2021-11-30 DIAGNOSIS — R112 Nausea with vomiting, unspecified: Secondary | ICD-10-CM | POA: Diagnosis not present

## 2021-11-30 DIAGNOSIS — E860 Dehydration: Secondary | ICD-10-CM | POA: Diagnosis not present

## 2021-11-30 DIAGNOSIS — I10 Essential (primary) hypertension: Secondary | ICD-10-CM | POA: Diagnosis not present

## 2021-11-30 DIAGNOSIS — Z7982 Long term (current) use of aspirin: Secondary | ICD-10-CM | POA: Diagnosis not present

## 2021-11-30 DIAGNOSIS — Z79899 Other long term (current) drug therapy: Secondary | ICD-10-CM | POA: Diagnosis not present

## 2021-11-30 DIAGNOSIS — Z1152 Encounter for screening for COVID-19: Secondary | ICD-10-CM | POA: Insufficient documentation

## 2021-11-30 LAB — COMPREHENSIVE METABOLIC PANEL
ALT: 17 U/L (ref 0–44)
AST: 26 U/L (ref 15–41)
Albumin: 3.8 g/dL (ref 3.5–5.0)
Alkaline Phosphatase: 51 U/L (ref 38–126)
Anion gap: 7 (ref 5–15)
BUN: 12 mg/dL (ref 8–23)
CO2: 30 mmol/L (ref 22–32)
Calcium: 8.8 mg/dL — ABNORMAL LOW (ref 8.9–10.3)
Chloride: 101 mmol/L (ref 98–111)
Creatinine, Ser: 0.82 mg/dL (ref 0.61–1.24)
GFR, Estimated: >60 mL/min (ref >60)
Glucose, Bld: 141 mg/dL — ABNORMAL HIGH (ref 70–99)
Potassium: 3.4 mmol/L — ABNORMAL LOW (ref 3.5–5.1)
Sodium: 138 mmol/L (ref 135–145)
Total Bilirubin: 0.4 mg/dL (ref 0.3–1.2)
Total Protein: 7.4 g/dL (ref 6.5–8.1)

## 2021-11-30 LAB — CBC WITH DIFFERENTIAL/PLATELET
Abs Immature Granulocytes: 0.01 10*3/uL (ref 0.00–0.07)
Basophils Absolute: 0 10*3/uL (ref 0.0–0.1)
Basophils Relative: 1 %
Eosinophils Absolute: 0 10*3/uL (ref 0.0–0.5)
Eosinophils Relative: 0 %
HCT: 42.4 % (ref 39.0–52.0)
Hemoglobin: 13.9 g/dL (ref 13.0–17.0)
Immature Granulocytes: 0 %
Lymphocytes Relative: 22 %
Lymphs Abs: 0.9 10*3/uL (ref 0.7–4.0)
MCH: 27.1 pg (ref 26.0–34.0)
MCHC: 32.8 g/dL (ref 30.0–36.0)
MCV: 82.8 fL (ref 80.0–100.0)
Monocytes Absolute: 0.2 10*3/uL (ref 0.1–1.0)
Monocytes Relative: 5 %
Neutro Abs: 2.9 10*3/uL (ref 1.7–7.7)
Neutrophils Relative %: 72 %
Platelets: 166 10*3/uL (ref 150–400)
RBC: 5.12 MIL/uL (ref 4.22–5.81)
RDW: 13.4 % (ref 11.5–15.5)
WBC: 4.1 10*3/uL (ref 4.0–10.5)
nRBC: 0 % (ref 0.0–0.2)

## 2021-11-30 LAB — LIPASE, BLOOD: Lipase: 31 U/L (ref 11–51)

## 2021-11-30 LAB — RESP PANEL BY RT-PCR (FLU A&B, COVID) ARPGX2
Influenza A by PCR: NEGATIVE
Influenza B by PCR: NEGATIVE
SARS Coronavirus 2 by RT PCR: NEGATIVE

## 2021-11-30 MED ORDER — SODIUM CHLORIDE 0.9 % IV BOLUS
1000.0000 mL | Freq: Once | INTRAVENOUS | Status: AC
  Administered 2021-11-30: 1000 mL via INTRAVENOUS

## 2021-11-30 MED ORDER — ONDANSETRON HCL 4 MG PO TABS: 4.0000 mg | ORAL_TABLET | Freq: Four times a day (QID) | ORAL | 0 refills | Status: DC | PRN

## 2021-11-30 MED ORDER — ONDANSETRON HCL 4 MG/2ML IJ SOLN
4.0000 mg | Freq: Once | INTRAMUSCULAR | Status: AC
  Administered 2021-11-30: 4 mg via INTRAVENOUS
  Filled 2021-11-30: qty 2

## 2021-11-30 NOTE — ED Provider Notes (Signed)
[facility] EMERGENCY DEPARTMENT Provider Note   CSN: [id number] Arrival date & time: 11/30/21  0830     History  Chief Complaint  Patient presents with   Emesis    Vincent [name] is a 66 y.o. male.  Patient is a 66 year old male presenting for nausea and vomiting.  Patient states he ate at [facility] yesterday and began feeling nausea with frequent episodes of vomiting that began last night.  Patient states this morning he has continued to have nausea and vomiting with feelings of dehydration.  He denies any fevers or chills.  Denies any abdominal pain or diarrhea.  Denies any other sick contacts.  The history is provided by the patient. No language interpreter was used.  Emesis Associated symptoms: no abdominal pain, no arthralgias, no chills, no cough, no fever and no sore throat        Home Medications Prior to Admission medications   Medication Sig Start Date End Date Taking? Authorizing Provider  ondansetron (ZOFRAN) 4 MG tablet Take 1 tablet (4 mg total) by mouth every 6 (six) hours as needed for nausea or vomiting. 11/30/21  Yes Edwin Dada P, DO  acetaminophen (TYLENOL 8 HOUR) 650 MG CR tablet Take 1 tablet (650 mg total) by mouth every 8 (eight) hours as needed for pain. 10/18/20   Sandford Craze, NP  amLODIPine-Valsartan-HCTZ 69-450-38 MG TABS Take 1 tablet by mouth daily. 07/26/21   Sharlene Dory, DO  aspirin 81 MG chewable tablet Chew 1 tablet (81 mg total) by mouth daily. 01/25/17   Bing Neighbors, FNP  atorvastatin (LIPITOR) 80 MG tablet Take 80 mg by mouth daily.    [provider]  clopidogrel (PLAVIX) 75 MG tablet TAKE 1 TABLET BY MOUTH ONCE DAILY 05/19/21 05/19/22  Sharlene Dory, DO  colchicine 0.6 MG tablet Take 2 tabs by mouth now and then repeat in 1 hour. Then take 1 tab daily until flare resolves. 10/30/21   Sharlene Dory, DO  diclofenac Sodium (VOLTAREN ARTHRITIS PAIN) 1 % GEL Apply 2 grams topically 4  (four) times daily. 07/26/21   Wendling, Jilda Roche, DO  diltiazem (CARDIZEM CD) 240 MG 24 hr capsule Take 1 capsule (240 mg total) by mouth daily. 08/03/21   Baldo Daub, MD  metoprolol tartrate (LOPRESSOR) 100 MG tablet Take 1 tablet (100 mg total) by mouth once for 1 dose. Please take 2 hours before CT 08/03/21 08/10/21  Baldo Daub, MD  nitroGLYCERIN (NITROSTAT) 0.4 MG SL tablet Place 1 tablet (0.4 mg total) under the tongue every 5 (five) minutes as needed for chest pain. 08/03/21   Baldo Daub, MD  oxybutynin (DITROPAN XL) 5 MG 24 hr tablet Take 1 tablet (5 mg total) by mouth at bedtime. 10/10/21   Wendling, Jilda Roche, DO  potassium chloride (KLOR-CON 10) 10 MEQ tablet Take 1 tablet (10 mEq total) by mouth daily. 10/11/21   Sharlene Dory, DO      Allergies    Patient has no known allergies.    Review of Systems   Review of Systems  Constitutional:  Negative for chills and fever.  HENT:  Negative for ear pain and sore throat.   Eyes:  Negative for pain and visual disturbance.  Respiratory:  Negative for cough and shortness of breath.   Cardiovascular:  Negative for chest pain and palpitations.  Gastrointestinal:  Positive for nausea and vomiting. Negative for abdominal pain.  Genitourinary:  Negative for dysuria and hematuria.  Musculoskeletal:  Negative for arthralgias and back pain.  Skin:  Negative for color change and rash.  Neurological:  Negative for seizures and syncope.  All other systems reviewed and are negative.   Physical Exam Updated Vital Signs BP (!) 146/81   Pulse 67   Temp 98.3 F (36.8 C) (Oral)   Resp (!) 8   Ht 5\' 11"  (1.803 m)   Wt 105.7 kg   SpO2 100%   BMI 32.50 kg/m  Physical Exam Vitals and nursing note reviewed.  Constitutional:      General: He is not in acute distress.    Appearance: He is well-developed.  HENT:     Head: Normocephalic and atraumatic.  Eyes:     Conjunctiva/sclera: Conjunctivae normal.   Cardiovascular:     Rate and Rhythm: Normal rate and regular rhythm.     Heart sounds: No murmur heard. Pulmonary:     Effort: Pulmonary effort is normal. No respiratory distress.     Breath sounds: Normal breath sounds.  Abdominal:     Palpations: Abdomen is soft.     Tenderness: There is no abdominal tenderness.  Musculoskeletal:        General: No swelling.     Cervical back: Neck supple.  Skin:    General: Skin is warm and dry.     Capillary Refill: Capillary refill takes less than 2 seconds.  Neurological:     Mental Status: He is alert.  Psychiatric:        Mood and Affect: Mood normal.     ED Results / Procedures / Treatments   Labs (all labs ordered are listed, but only abnormal results are displayed) Labs Reviewed  COMPREHENSIVE METABOLIC PANEL - Abnormal; Notable for the following components:      Result Value   Potassium 3.4 (*)    Glucose, Bld 141 (*)    Calcium 8.8 (*)    All other components within normal limits  RESP PANEL BY RT-PCR (FLU A&B, COVID) ARPGX2  CBC WITH DIFFERENTIAL/PLATELET  LIPASE, BLOOD    EKG EKG Interpretation  Date/Time:  Thursday November 30 2021 10:30:19 EST Ventricular Rate:  66 PR Interval:  170 QRS Duration: 93 QT Interval:  420 QTC Calculation: 440 R Axis:   2 Text Interpretation: Sinus rhythm Probable left atrial enlargement Confirmed by [id number] ([id number]) on 11/30/2021 10:39:11 AM  Radiology No results found.  Procedures Procedures    Medications Ordered in ED Medications  sodium chloride 0.9 % bolus 1,000 mL (0 mLs Intravenous Stopped 11/30/21 1009)  ondansetron (ZOFRAN) injection 4 mg (4 mg Intravenous Given 11/30/21 0907)    ED Course/ Medical Decision Making/ A&P                           Medical Decision Making Amount and/or Complexity of Data Reviewed Labs: ordered.  Risk Prescription drug management.   51:52 AM 66 year old male presenting for nausea and vomiting.  Patient is alert and oriented  x 3, no acute distress, afebrile, stable vital signs.  Abdomen is soft and nontender.  Concerning for viral gastritis versus foodborne illness.  IV fluids administered with Zofran.  On reevaluation patient admits to improvement of symptoms.  Patient has not vomited since Zofran has been given.  Laboratory studies as interpreted by myself demonstrates no leukocytosis.  No signs or symptoms of sepsis.  Stable liver profile, lipase, and renal function.  No overt signs of dehydration.  COVID-19 negative.  Bowel obstruction  considered however patient's abdomen is soft and nontender with improvement of vomiting at this time.  Patient will be safe for discharge with strict return precautions if vomiting returns or abdominal pain develops.  Symptoms likely secondary to foodborne illness.  Recommendations for rest, bland diet, and prescription for Zofran sent to pharmacy.  ECG obtained to rule out atypical chest pain. Interpreted by myself. No ST segment elevation or depression.  No T wave inversions.  Normal axis.  Normal intervals.  Patient did have episode of decreased respiratory rate and hypoxia that occurred while sleeping.  He has a known history of OSA and does not seek treatment for it.  He is recommended for close follow-up with pulmonology or PCP for further management of OSA.  Patient in no distress and overall condition improved here in the ED. Detailed discussions were had with the patient regarding current findings, and need for close f/u with PCP or on call doctor. The patient has been instructed to return immediately if the symptoms worsen in any way for re-evaluation. Patient verbalized understanding and is in agreement with current care plan. All questions answered prior to discharge.         Final Clinical Impression(s) / ED Diagnoses Final diagnoses:  Nausea and vomiting, unspecified vomiting type    Rx / DC Orders ED Discharge Orders          Ordered    ondansetron (ZOFRAN) 4  MG tablet  Every 6 hours PRN        11/30/21 1040              Franne Forts, DO 11/30/21 1041

## 2021-11-30 NOTE — Discharge Instructions (Addendum)
Today your symptoms improved after administration of an antinausea medication called Zofran and some IV fluids.  Your symptoms sound like they are secondary to a foodborne illness versus a viral gastritis.  I recommend rest, a bland food diet, and Zofran every 6 hours as needed for nausea and vomiting.  If you develop abdominal pain at any time or your nausea and vomiting is not controlled please return to emergency department for further evaluation and CT imaging of your abdomen.  Your laboratory studies do not suggest any acute life-threatening etiologies of vomiting at this time.  Your liver profile, pancreatic function, and renal function were all normal.  Your electrolytes were stable.  You do not have an elevated white blood cell count indicating in his severe infection.  Return to the emergency department as needed or follow-up with your primary care physician in the next 3 days as needed.

## 2021-11-30 NOTE — ED Triage Notes (Addendum)
Patient states he has been nauseated all night and vomiting. Denies any other symptoms. States he did have two drinks last night and then ate at [employer], when he got home, he went to lay down and started getting sick.

## 2021-11-30 NOTE — ED Notes (Signed)
While in patient's room, patient fell asleep and oxygen sat decreased to 84% on room. Woke patient up and his oxygen sats increased to 95% on room air. Patient states he does have sleep apnea, but has never been treated for it. Placed patient on 2 liters via nasal cannula while sleeping. Dr. Wallace Cullens made aware.

## 2021-12-06 ENCOUNTER — Ambulatory Visit: Payer: Medicare Other | Attending: Cardiology | Admitting: Cardiology

## 2021-12-14 ENCOUNTER — Other Ambulatory Visit (HOSPITAL_BASED_OUTPATIENT_CLINIC_OR_DEPARTMENT_OTHER): Payer: Self-pay

## 2021-12-14 ENCOUNTER — Telehealth: Payer: Self-pay | Admitting: Family Medicine

## 2021-12-14 NOTE — Telephone Encounter (Signed)
Copied from CRM [id number]. Topic: Medicare AWV >> Dec 14, 2021 12:47 PM Gwenith Spitz wrote: Reason for CRM: LVM PATIENT TO CALL [phone] TO SCHEDULE AWVI WITH HEALTH COACH TINA

## 2022-01-22 ENCOUNTER — Other Ambulatory Visit: Payer: Self-pay | Admitting: Family Medicine

## 2022-01-22 ENCOUNTER — Other Ambulatory Visit (HOSPITAL_BASED_OUTPATIENT_CLINIC_OR_DEPARTMENT_OTHER): Payer: Self-pay

## 2022-01-22 MED ORDER — AMLODIPINE-VALSARTAN-HCTZ 10-320-25 MG PO TABS
1.0000 | ORAL_TABLET | Freq: Every day | ORAL | 1 refills | Status: DC
  Filled 2022-01-22: qty 90, 90d supply, fill #0
  Filled 2022-05-09 – 2022-05-10 (×2): qty 90, 90d supply, fill #1

## 2022-01-22 MED ORDER — POTASSIUM CHLORIDE ER 10 MEQ PO TBCR
10.0000 meq | EXTENDED_RELEASE_TABLET | Freq: Every day | ORAL | 0 refills | Status: DC
  Filled 2022-01-22: qty 30, 30d supply, fill #0

## 2022-01-22 MED ORDER — COLCHICINE 0.6 MG PO TABS
ORAL_TABLET | ORAL | 0 refills | Status: DC
  Filled 2022-01-22: qty 30, 18d supply, fill #0

## 2022-01-23 ENCOUNTER — Other Ambulatory Visit (HOSPITAL_BASED_OUTPATIENT_CLINIC_OR_DEPARTMENT_OTHER): Payer: Self-pay

## 2022-01-26 ENCOUNTER — Other Ambulatory Visit (HOSPITAL_BASED_OUTPATIENT_CLINIC_OR_DEPARTMENT_OTHER): Payer: Self-pay

## 2022-03-14 ENCOUNTER — Other Ambulatory Visit (HOSPITAL_BASED_OUTPATIENT_CLINIC_OR_DEPARTMENT_OTHER): Payer: Self-pay

## 2022-04-23 ENCOUNTER — Encounter: Payer: Self-pay | Admitting: *Deleted

## 2022-05-04 ENCOUNTER — Other Ambulatory Visit (HOSPITAL_BASED_OUTPATIENT_CLINIC_OR_DEPARTMENT_OTHER): Payer: Self-pay

## 2022-05-04 ENCOUNTER — Encounter (HOSPITAL_BASED_OUTPATIENT_CLINIC_OR_DEPARTMENT_OTHER): Payer: Self-pay

## 2022-05-09 ENCOUNTER — Other Ambulatory Visit (HOSPITAL_BASED_OUTPATIENT_CLINIC_OR_DEPARTMENT_OTHER): Payer: Self-pay

## 2022-05-09 ENCOUNTER — Other Ambulatory Visit: Payer: Self-pay | Admitting: Family Medicine

## 2022-05-09 MED ORDER — COLCHICINE 0.6 MG PO TABS
ORAL_TABLET | ORAL | 0 refills | Status: DC
  Filled 2022-05-09: qty 30, 8d supply, fill #0

## 2022-05-09 MED ORDER — POTASSIUM CHLORIDE ER 10 MEQ PO TBCR
10.0000 meq | EXTENDED_RELEASE_TABLET | Freq: Every day | ORAL | 0 refills | Status: DC
  Filled 2022-05-09: qty 30, 30d supply, fill #0

## 2022-05-10 ENCOUNTER — Other Ambulatory Visit (HOSPITAL_BASED_OUTPATIENT_CLINIC_OR_DEPARTMENT_OTHER): Payer: Self-pay

## 2022-06-13 ENCOUNTER — Encounter: Payer: Self-pay | Admitting: Family Medicine

## 2022-06-13 ENCOUNTER — Ambulatory Visit (INDEPENDENT_AMBULATORY_CARE_PROVIDER_SITE_OTHER): Payer: 59 | Admitting: Family Medicine

## 2022-06-13 ENCOUNTER — Other Ambulatory Visit: Payer: Self-pay

## 2022-06-13 ENCOUNTER — Other Ambulatory Visit (HOSPITAL_BASED_OUTPATIENT_CLINIC_OR_DEPARTMENT_OTHER): Payer: Self-pay

## 2022-06-13 VITALS — BP 128/78 | HR 79 | Temp 97.7°F | Ht 71.5 in | Wt 229.5 lb

## 2022-06-13 DIAGNOSIS — N401 Enlarged prostate with lower urinary tract symptoms: Secondary | ICD-10-CM | POA: Diagnosis not present

## 2022-06-13 DIAGNOSIS — R0681 Apnea, not elsewhere classified: Secondary | ICD-10-CM

## 2022-06-13 DIAGNOSIS — Z0000 Encounter for general adult medical examination without abnormal findings: Secondary | ICD-10-CM | POA: Diagnosis not present

## 2022-06-13 DIAGNOSIS — Z125 Encounter for screening for malignant neoplasm of prostate: Secondary | ICD-10-CM

## 2022-06-13 DIAGNOSIS — E1169 Type 2 diabetes mellitus with other specified complication: Secondary | ICD-10-CM

## 2022-06-13 DIAGNOSIS — Z7984 Long term (current) use of oral hypoglycemic drugs: Secondary | ICD-10-CM | POA: Diagnosis not present

## 2022-06-13 DIAGNOSIS — R351 Nocturia: Secondary | ICD-10-CM | POA: Diagnosis not present

## 2022-06-13 DIAGNOSIS — E669 Obesity, unspecified: Secondary | ICD-10-CM

## 2022-06-13 MED ORDER — TAMSULOSIN HCL 0.4 MG PO CAPS
0.4000 mg | ORAL_CAPSULE | Freq: Every day | ORAL | 3 refills | Status: DC
  Filled 2022-06-13 (×2): qty 30, 30d supply, fill #0
  Filled 2022-07-17: qty 30, 30d supply, fill #1
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
## 2022-06-13 NOTE — Patient Instructions (Addendum)
Give [place] 2-3 business days to get the results of your labs back.   Keep the diet clean and stay active.  Please get me a copy of your advanced directive form at your convenience.   If you do not hear anything about your referral in the next 1-2 weeks, call our office and ask for an update.  Mind caffeine after noon or alcohol intake within 3-4 hrs before bed.   For itching: Claritin (loratadine), Allegra (fexofenadine), Zyrtec (cetirizine) which is also equivalent to Xyzal (levocetirizine); these are listed in order from weakest to strongest. Generic, and therefore cheaper, options are in the parentheses.   There are available OTC, and the generic versions, which may be cheaper, are in parentheses. Show this to a pharmacist if you have trouble finding any of these items.  Let us know if you need anything.

## 2022-06-14 LAB — COMPREHENSIVE METABOLIC PANEL
ALT: 15 U/L (ref 0–53)
AST: 22 U/L (ref 0–37)
Albumin: 4.2 g/dL (ref 3.5–5.2)
Alkaline Phosphatase: 54 U/L (ref 39–117)
BUN: 23 mg/dL (ref 6–23)
CO2: 33 mEq/L — ABNORMAL HIGH (ref 19–32)
Calcium: 9.4 mg/dL (ref 8.4–10.5)
Chloride: 103 mEq/L (ref 96–112)
Creatinine, Ser: 1.19 mg/dL (ref 0.40–1.50)
GFR: 63.39 mL/min (ref >60.00)
Glucose, Bld: 117 mg/dL — ABNORMAL HIGH (ref 70–99)
Potassium: 3.8 mEq/L (ref 3.5–5.1)
Sodium: 145 mEq/L (ref 135–145)
Total Bilirubin: 0.4 mg/dL (ref 0.2–1.2)
Total Protein: 7.3 g/dL (ref 6.0–8.3)

## 2022-06-14 LAB — CBC
HCT: 42.5 % (ref 39.0–52.0)
Hemoglobin: 13.6 g/dL (ref 13.0–17.0)
MCHC: 32 g/dL (ref 30.0–36.0)
MCV: 85.5 fl (ref 78.0–100.0)
Platelets: 171 10*3/uL (ref 150.0–400.0)
RBC: 4.97 Mil/uL (ref 4.22–5.81)
RDW: 14.6 % (ref 11.5–15.5)
WBC: 5.5 10*3/uL (ref 4.0–10.5)

## 2022-06-14 LAB — HEMOGLOBIN A1C: Hgb A1c MFr Bld: 6.4 % (ref 4.6–6.5)

## 2022-06-14 LAB — LIPID PANEL
Cholesterol: 208 mg/dL — ABNORMAL HIGH (ref 0–200)
HDL: 46.7 mg/dL (ref >39.00)
LDL Cholesterol: 136 mg/dL — ABNORMAL HIGH (ref 0–99)
NonHDL: 161.65
Total CHOL/HDL Ratio: 4
Triglycerides: 127 mg/dL (ref 0.0–149.0)
VLDL: 25.4 mg/dL (ref 0.0–40.0)

## 2022-06-14 LAB — PSA, MEDICARE: PSA: 4.8 ng/ml — ABNORMAL HIGH (ref 0.10–4.00)

## 2022-06-18 ENCOUNTER — Other Ambulatory Visit: Payer: Self-pay

## 2022-06-20 ENCOUNTER — Other Ambulatory Visit (HOSPITAL_BASED_OUTPATIENT_CLINIC_OR_DEPARTMENT_OTHER): Payer: Self-pay

## 2022-07-05 ENCOUNTER — Telehealth: Payer: Self-pay

## 2022-07-05 NOTE — Telephone Encounter (Signed)
Pt called to discuss labs from 06/13/22 and ask pt if he Taking Atorvastatin/Lipitor. Pt stated not sure will call back  To let us know if he's still taking medication and haven't seen urology regarding PSA.

## 2022-07-17 ENCOUNTER — Other Ambulatory Visit: Payer: Self-pay | Admitting: Family Medicine

## 2022-07-17 ENCOUNTER — Other Ambulatory Visit (HOSPITAL_BASED_OUTPATIENT_CLINIC_OR_DEPARTMENT_OTHER): Payer: Self-pay

## 2022-07-17 DIAGNOSIS — E782 Mixed hyperlipidemia: Secondary | ICD-10-CM

## 2022-07-17 MED ORDER — ATORVASTATIN CALCIUM 80 MG PO TABS
80.0000 mg | ORAL_TABLET | Freq: Every day | ORAL | 3 refills | Status: DC
  Filled 2022-07-17: qty 90, 90d supply, fill #0
  Filled 2022-10-15: qty 90, 90d supply, fill #1
  Filled 2023-04-11: qty 90, 90d supply, fill #2
  Filled 2023-07-09: qty 90, 90d supply, fill #3

## 2022-07-18 ENCOUNTER — Ambulatory Visit (INDEPENDENT_AMBULATORY_CARE_PROVIDER_SITE_OTHER): Payer: 59 | Admitting: Family Medicine

## 2022-07-18 ENCOUNTER — Other Ambulatory Visit (HOSPITAL_BASED_OUTPATIENT_CLINIC_OR_DEPARTMENT_OTHER): Payer: Self-pay

## 2022-07-18 ENCOUNTER — Encounter: Payer: Self-pay | Admitting: Family Medicine

## 2022-07-18 VITALS — BP 110/68 | HR 82 | Temp 98.0°F | Ht 71.5 in | Wt 227.0 lb

## 2022-07-18 DIAGNOSIS — N401 Enlarged prostate with lower urinary tract symptoms: Secondary | ICD-10-CM | POA: Diagnosis not present

## 2022-07-18 DIAGNOSIS — R351 Nocturia: Secondary | ICD-10-CM

## 2022-07-18 DIAGNOSIS — L308 Other specified dermatitis: Secondary | ICD-10-CM

## 2022-07-18 MED ORDER — TAMSULOSIN HCL 0.4 MG PO CAPS
0.4000 mg | ORAL_CAPSULE | Freq: Every day | ORAL | 3 refills | Status: DC
  Filled 2022-07-18: qty 90, 90d supply, fill #0
  Filled 2022-11-30: qty 90, 90d supply, fill #1
  Filled 2023-04-11: qty 90, 90d supply, fill #2
  Filled 2023-07-09: qty 90, 90d supply, fill #3

## 2022-07-18 MED ORDER — LEVOCETIRIZINE DIHYDROCHLORIDE 5 MG PO TABS
5.0000 mg | ORAL_TABLET | Freq: Every evening | ORAL | 2 refills | Status: DC
  Filled 2022-07-18: qty 30, 30d supply, fill #0
  Filled 2022-11-30: qty 30, 30d supply, fill #1
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
## 2022-07-18 NOTE — Patient Instructions (Addendum)
Try the medicine first.   If the new medication does not work: Stop your Exforge HCT for 2 weeks and see if the itching improves. If not, stop your Cardizem for 2 weeks. Then stop your metoprolol for 2 weeks and finally your oxybutynin. If at any time the itching resolves, hold that medicine and let me know.   Please call : [phone] for your eye exam.   Let us know if you need anything.

## 2022-07-30 ENCOUNTER — Encounter: Payer: Self-pay | Admitting: Neurology

## 2022-07-30 ENCOUNTER — Institutional Professional Consult (permissible substitution): Payer: 59 | Admitting: Neurology

## 2022-08-08 ENCOUNTER — Other Ambulatory Visit: Payer: Self-pay | Admitting: Family Medicine

## 2022-08-08 ENCOUNTER — Other Ambulatory Visit (HOSPITAL_BASED_OUTPATIENT_CLINIC_OR_DEPARTMENT_OTHER): Payer: Self-pay

## 2022-08-08 MED ORDER — OXYBUTYNIN CHLORIDE ER 5 MG PO TB24
5.0000 mg | ORAL_TABLET | Freq: Every day | ORAL | 2 refills | Status: DC
  Filled 2022-08-08: qty 90, 90d supply, fill #0
  Filled 2022-11-30: qty 90, 90d supply, fill #1

## 2022-08-08 MED ORDER — COLCHICINE 0.6 MG PO TABS
ORAL_TABLET | ORAL | 0 refills | Status: DC
  Filled 2022-08-08: qty 30, 30d supply, fill #0

## 2022-08-08 MED ORDER — POTASSIUM CHLORIDE ER 10 MEQ PO TBCR
10.0000 meq | EXTENDED_RELEASE_TABLET | Freq: Every day | ORAL | 0 refills | Status: DC
  Filled 2022-08-08: qty 30, 30d supply, fill #0
  Filled 2022-08-09: qty 24, 24d supply, fill #0
  Filled 2022-08-09: qty 6, 6d supply, fill #0

## 2022-08-08 MED ORDER — AMLODIPINE-VALSARTAN-HCTZ 10-320-25 MG PO TABS
1.0000 | ORAL_TABLET | Freq: Every day | ORAL | 1 refills | Status: DC
  Filled 2022-08-08: qty 90, 90d supply, fill #0
  Filled 2022-11-30 – 2022-12-17 (×2): qty 90, 90d supply, fill #1

## 2022-08-09 ENCOUNTER — Other Ambulatory Visit (HOSPITAL_BASED_OUTPATIENT_CLINIC_OR_DEPARTMENT_OTHER): Payer: Self-pay

## 2022-08-10 ENCOUNTER — Other Ambulatory Visit (HOSPITAL_BASED_OUTPATIENT_CLINIC_OR_DEPARTMENT_OTHER): Payer: Self-pay

## 2022-08-13 ENCOUNTER — Other Ambulatory Visit (HOSPITAL_BASED_OUTPATIENT_CLINIC_OR_DEPARTMENT_OTHER): Payer: Self-pay

## 2022-08-26 ENCOUNTER — Encounter (HOSPITAL_BASED_OUTPATIENT_CLINIC_OR_DEPARTMENT_OTHER): Payer: Self-pay | Admitting: Emergency Medicine

## 2022-08-26 ENCOUNTER — Emergency Department (HOSPITAL_BASED_OUTPATIENT_CLINIC_OR_DEPARTMENT_OTHER)
Admission: EM | Admit: 2022-08-26 | Discharge: 2022-08-26 | Disposition: A | Discharge status: Home / Self Care | Payer: 59 | Source: Home / Self Care | Attending: Emergency Medicine | Admitting: Emergency Medicine

## 2022-08-26 DIAGNOSIS — I10 Essential (primary) hypertension: Secondary | ICD-10-CM | POA: Insufficient documentation

## 2022-08-26 DIAGNOSIS — R112 Nausea with vomiting, unspecified: Secondary | ICD-10-CM | POA: Diagnosis not present

## 2022-08-26 DIAGNOSIS — Z7982 Long term (current) use of aspirin: Secondary | ICD-10-CM | POA: Insufficient documentation

## 2022-08-26 DIAGNOSIS — Z79899 Other long term (current) drug therapy: Secondary | ICD-10-CM | POA: Diagnosis not present

## 2022-08-26 DIAGNOSIS — I2510 Atherosclerotic heart disease of native coronary artery without angina pectoris: Secondary | ICD-10-CM | POA: Insufficient documentation

## 2022-08-26 LAB — CBC WITH DIFFERENTIAL/PLATELET
Abs Immature Granulocytes: 0.02 10*3/uL (ref 0.00–0.07)
Basophils Absolute: 0 10*3/uL (ref 0.0–0.1)
Basophils Relative: 1 %
Eosinophils Absolute: 0.2 10*3/uL (ref 0.0–0.5)
Eosinophils Relative: 3 %
HCT: 40.6 % (ref 39.0–52.0)
Hemoglobin: 13.4 g/dL (ref 13.0–17.0)
Immature Granulocytes: 0 %
Lymphocytes Relative: 25 %
Lymphs Abs: 1.4 10*3/uL (ref 0.7–4.0)
MCH: 27.4 pg (ref 26.0–34.0)
MCHC: 33 g/dL (ref 30.0–36.0)
MCV: 83 fL (ref 80.0–100.0)
Monocytes Absolute: 0.5 10*3/uL (ref 0.1–1.0)
Monocytes Relative: 8 %
Neutro Abs: 3.7 10*3/uL (ref 1.7–7.7)
Neutrophils Relative %: 63 %
Platelets: 128 10*3/uL — ABNORMAL LOW (ref 150–400)
RBC: 4.89 MIL/uL (ref 4.22–5.81)
RDW: 14 % (ref 11.5–15.5)
WBC: 5.8 10*3/uL (ref 4.0–10.5)
nRBC: 0 % (ref 0.0–0.2)

## 2022-08-26 LAB — COMPREHENSIVE METABOLIC PANEL
ALT: 22 U/L (ref 0–44)
AST: 24 U/L (ref 15–41)
Albumin: 3.9 g/dL (ref 3.5–5.0)
Alkaline Phosphatase: 58 U/L (ref 38–126)
Anion gap: 10 (ref 5–15)
BUN: 16 mg/dL (ref 8–23)
CO2: 26 mmol/L (ref 22–32)
Calcium: 8.6 mg/dL — ABNORMAL LOW (ref 8.9–10.3)
Chloride: 103 mmol/L (ref 98–111)
Creatinine, Ser: 0.82 mg/dL (ref 0.61–1.24)
GFR, Estimated: >60 mL/min (ref >60)
Glucose, Bld: 141 mg/dL — ABNORMAL HIGH (ref 70–99)
Potassium: 3.4 mmol/L — ABNORMAL LOW (ref 3.5–5.1)
Sodium: 139 mmol/L (ref 135–145)
Total Bilirubin: 0.5 mg/dL (ref 0.3–1.2)
Total Protein: 7.3 g/dL (ref 6.5–8.1)

## 2022-08-26 LAB — TROPONIN I (HIGH SENSITIVITY): Troponin I (High Sensitivity): 14 ng/L (ref <18)

## 2022-08-26 LAB — LIPASE, BLOOD: Lipase: 23 U/L (ref 11–51)

## 2022-08-26 LAB — ETHANOL: Alcohol, Ethyl (B): <10 mg/dL (ref <10)

## 2022-08-26 MED ORDER — SODIUM CHLORIDE 0.9 % IV BOLUS
1000.0000 mL | Freq: Once | INTRAVENOUS | Status: AC
  Administered 2022-08-26: 1000 mL via INTRAVENOUS

## 2022-08-26 MED ORDER — ONDANSETRON 4 MG PO TBDP: 4.0000 mg | ORAL_TABLET | Freq: Three times a day (TID) | ORAL | 0 refills | Status: DC | PRN

## 2022-08-26 MED ORDER — ONDANSETRON HCL 4 MG/2ML IJ SOLN
4.0000 mg | Freq: Once | INTRAMUSCULAR | Status: AC
  Administered 2022-08-26: 4 mg via INTRAVENOUS
  Filled 2022-08-26: qty 2

## 2022-08-26 NOTE — ED Provider Notes (Signed)
[place] [facility] Provider Note   CSN: [id number] Arrival date & time: 08/26/22  0556     History  Chief Complaint  Patient presents with   Nausea    Vincent [name] is a 68 y.o. male.  Patient here with nausea and vomiting.  States he was drinking throughout the day on Saturday and stopped about 1 hour ago.  States he drank "a lot" of beer.  Cannot quantify how much.  Normally does not drink.  Was feeling nauseous and 1 and 1 episode of vomiting.  No black or bloody emesis.  No diarrhea.  No abdominal pain, chest pain, shortness of breath.  No fever.  Still has appendix and gallbladder.  No other drug use.  History of CAD, hyper lipidemia, hypertension denies chest pain.  Denies any other drug use..    The history is provided by the patient.       Home Medications Prior to Admission medications   Medication Sig Start Date End Date Taking? Authorizing Provider  amLODIPine-Valsartan-HCTZ 10-320-25 MG TABS Take 1 tablet by mouth daily. 08/08/22   Sharlene Dory, DO  aspirin 81 MG chewable tablet Chew 1 tablet (81 mg total) by mouth daily. 01/25/17   Bing Neighbors, NP  atorvastatin (LIPITOR) 80 MG tablet Take 1 tablet (80 mg total) by mouth daily at 6 PM. 07/17/22 07/17/23  Wendling, Jilda Roche, DO  colchicine 0.6 MG tablet Take 2 tablets by mouth now and then repeat in 1 hour. Then take 1 tablet daily until flare resolves. 08/08/22   Sharlene Dory, DO  diclofenac Sodium (VOLTAREN ARTHRITIS PAIN) 1 % GEL Apply 2 grams topically 4 (four) times daily. 07/26/21   Wendling, Jilda Roche, DO  diltiazem (CARDIZEM CD) 240 MG 24 hr capsule Take 1 capsule (240 mg total) by mouth daily. 08/03/21   Baldo Daub, MD  levocetirizine (XYZAL) 5 MG tablet Take 1 tablet (5 mg total) by mouth every evening. 07/18/22   Sharlene Dory, DO  metoprolol tartrate (LOPRESSOR) 100 MG tablet Take 1 tablet (100 mg total) by mouth once for 1  dose. Please take 2 hours before CT 08/03/21 08/10/21  Baldo Daub, MD  nitroGLYCERIN (NITROSTAT) 0.4 MG SL tablet Place 1 tablet (0.4 mg total) under the tongue every 5 (five) minutes as needed for chest pain. 08/03/21   Baldo Daub, MD  oxybutynin (DITROPAN XL) 5 MG 24 hr tablet Take 1 tablet (5 mg total) by mouth at bedtime. 08/08/22   Sharlene Dory, DO  potassium chloride (KLOR-CON 10) 10 MEQ tablet Take 1 tablet (10 mEq total) by mouth daily. 08/08/22   Sharlene Dory, DO  tamsulosin (FLOMAX) 0.4 MG CAPS capsule Take 1 capsule (0.4 mg total) by mouth daily. 07/18/22   Sharlene Dory, DO      Allergies    Patient has no known allergies.    Review of Systems   Review of Systems  Constitutional:  Negative for activity change, appetite change and fever.  HENT:  Negative for congestion and rhinorrhea.   Respiratory:  Negative for cough and chest tightness.   Gastrointestinal:  Positive for nausea and vomiting. Negative for abdominal pain.  Genitourinary:  Negative for dysuria and hematuria.  Musculoskeletal:  Negative for arthralgias and myalgias.  Neurological:  Negative for dizziness, weakness and headaches.    all other systems are negative except as noted in the HPI and PMH.   Physical Exam Updated Vital  Signs BP (!) 178/92 (BP Location: Right Arm)   Pulse 69   Temp 98.3 F (36.8 C) (Oral)   Resp 18   Ht 5\' 11"  (1.803 m)   Wt 104.3 kg   SpO2 97%   BMI 32.08 kg/m  Physical Exam Vitals and nursing note reviewed.  Constitutional:      General: He is not in acute distress.    Appearance: He is well-developed.  HENT:     Head: Normocephalic and atraumatic.     Mouth/Throat:     Pharynx: No oropharyngeal exudate.  Eyes:     Conjunctiva/sclera: Conjunctivae normal.     Pupils: Pupils are equal, round, and reactive to light.  Neck:     Comments: No meningismus. Cardiovascular:     Rate and Rhythm: Normal rate and regular rhythm.     Heart  sounds: Normal heart sounds. No murmur heard. Pulmonary:     Effort: Pulmonary effort is normal. No respiratory distress.     Breath sounds: Normal breath sounds.  Abdominal:     Palpations: Abdomen is soft.     Tenderness: There is no abdominal tenderness. There is no guarding or rebound.  Musculoskeletal:        General: No tenderness. Normal range of motion.     Cervical back: Normal range of motion and neck supple.  Skin:    General: Skin is warm.  Neurological:     Mental Status: He is alert and oriented to person, place, and time.     Cranial Nerves: No cranial nerve deficit.     Motor: No abnormal muscle tone.     Coordination: Coordination normal.     Comments:  5/5 strength throughout. CN 2-12 intact.Equal grip strength.   Psychiatric:        Behavior: Behavior normal.     ED Results / Procedures / Treatments   Labs (all labs ordered are listed, but only abnormal results are displayed) Labs Reviewed  CBC WITH DIFFERENTIAL/PLATELET - Abnormal; Notable for the following components:      Result Value   Platelets 128 (*)    All other components within normal limits  COMPREHENSIVE METABOLIC PANEL - Abnormal; Notable for the following components:   Potassium 3.4 (*)    Glucose, Bld 141 (*)    Calcium 8.6 (*)    All other components within normal limits  LIPASE, BLOOD  ETHANOL  URINALYSIS, ROUTINE W REFLEX MICROSCOPIC  TROPONIN I (HIGH SENSITIVITY)    EKG EKG Interpretation Date/Time:  Sunday August 26 2022 06:20:13 EDT Ventricular Rate:  62 PR Interval:  180 QRS Duration:  91 QT Interval:  396 QTC Calculation: 403 R Axis:   54  Text Interpretation: Sinus rhythm Borderline repolarization abnormality Nonspecific T wave abnormality inferiorly Confirmed by Glynn Octave [phone]) on 08/26/2022 6:22:55 AM  Radiology No results found.  Procedures Procedures    Medications Ordered in ED Medications  sodium chloride 0.9 % bolus 1,000 mL (has no administration  in time range)  ondansetron (ZOFRAN) injection 4 mg (has no administration in time range)    ED Course/ Medical Decision Making/ A&P                                 Medical Decision Making Amount and/or Complexity of Data Reviewed Labs: ordered. Decision-making details documented in ED Course. Radiology: ordered and independent interpretation performed. Decision-making details documented in ED Course. ECG/medicine tests: ordered and  independent interpretation performed. Decision-making details documented in ED Course.  Risk Prescription drug management.   Nausea and vomiting after heavy alcohol use.  Stable vital signs.  Abdomen soft without peritoneal signs.  Given his history of CAD will obtain EKG.  IV fluids and antiemetics given.  Abdomen soft without peritoneal signs.  EKG does show some inferior ST segment sagging.  This is more pronounced than previous. Per Dr. Allyson Sabal, [facility] in 2018: "Mr. Veillon has noncritical disease in his RCA and LAD. He does have a high-grade lesion in a relatively small to medium sized diagonal branch in the proximal to midportion. The ostium and proximal portion had diffuse 60% stenosis that was approximately 1.75 mm. The distal diagonal branch. Stenosis was about 2-2.25 mm."  Denies any chest pain or shortness of breath today or abdominal pain.  Will screen with troponin.  Lab and symptom control pending at shift change. Dr. Lockie Mola to assume care.         Final Clinical Impression(s) / ED Diagnoses Final diagnoses:  Nausea and vomiting, unspecified vomiting type    Rx / DC Orders ED Discharge Orders     None         Suetta Hoffmeister, Jeannett Senior, MD 08/26/22 [phone]

## 2022-08-26 NOTE — ED Triage Notes (Signed)
Pt reports drinking "a lot of beer" all day Saturday and now having n/v. Denies other complaints. Reports being an occasional drinker. Denies drug use.

## 2022-08-26 NOTE — ED Provider Notes (Signed)
Patient here with nausea vomiting after alcohol use.  On my reevaluation he is feeling much better.  Nausea resolved.  Lab work was unremarkable.  Is not having any chest pain or abdominal pain.  Have no concern for ACS or intra-abdominal process.  Overall suspect symptoms secondary to heavy drinking.  Recommend hydration and continued use of Zofran at home.  Told return if symptoms worsen.  This chart was dictated using voice recognition software.  Despite best efforts to proofread,  errors can occur which can change the documentation meaning.    Virgina Norfolk, DO 08/26/22 [phone]

## 2022-09-06 ENCOUNTER — Other Ambulatory Visit (HOSPITAL_BASED_OUTPATIENT_CLINIC_OR_DEPARTMENT_OTHER): Payer: Self-pay

## 2022-09-06 ENCOUNTER — Ambulatory Visit (INDEPENDENT_AMBULATORY_CARE_PROVIDER_SITE_OTHER): Payer: 59 | Admitting: Neurology

## 2022-09-06 ENCOUNTER — Encounter: Payer: Self-pay | Admitting: Neurology

## 2022-09-06 VITALS — BP 139/63 | HR 78 | Ht 71.0 in | Wt 231.4 lb

## 2022-09-06 DIAGNOSIS — R0683 Snoring: Secondary | ICD-10-CM

## 2022-09-06 DIAGNOSIS — G478 Other sleep disorders: Secondary | ICD-10-CM

## 2022-09-06 DIAGNOSIS — G4719 Other hypersomnia: Secondary | ICD-10-CM

## 2022-09-06 DIAGNOSIS — G4730 Sleep apnea, unspecified: Secondary | ICD-10-CM | POA: Diagnosis not present

## 2022-09-06 HISTORY — DX: Other hypersomnia: G47.19

## 2022-09-06 HISTORY — DX: Other sleep disorders: G47.8

## 2022-09-06 HISTORY — DX: Snoring: R06.83
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
## 2022-09-06 NOTE — Patient Instructions (Addendum)
Healthy Living: Sleep In this video, you will learn why sleep is an important part of a healthy lifestyle. To view the content, go to this web address: [email or web address]  This video will expire on: 07/01/2024. If you need access to this video following this date, please reach out to the healthcare provider who assigned it to you. This information is not intended to replace advice given to you by your health care provider. Make sure you discuss any questions you have with your health care provider. Elsevier Patient Education  2024 [employer] Screening for Sleep Apnea  Sleep apnea is a condition in which breathing pauses or becomes shallow during sleep. Sleep apnea screening is a test to determine if you are at risk for sleep apnea. The test includes a series of questions. It will only takes a few minutes. Your health care provider may ask you to have this test in preparation for surgery or as part of a physical exam. What are the symptoms of sleep apnea? Common symptoms of sleep apnea include: Snoring. Waking up often at night. Daytime sleepiness. Pauses in breathing. Choking or gasping during sleep. Irritability. Forgetfulness. Trouble thinking clearly. Depression. Personality changes. Most people with sleep apnea do not know that they have it. What are the advantages of sleep apnea screening? Getting screened for sleep apnea can help: Ensure your safety. It is important for your health care providers to know whether or not you have sleep apnea, especially if you are having surgery or have other long-term (chronic) health conditions. Improve your health and allow you to get a better night's rest. Restful sleep can help you: Have more energy. Lose weight. Improve high blood pressure. Improve diabetes management. Prevent stroke. Prevent car accidents. What happens during the screening? Screening usually includes being asked a list of questions about your sleep  quality. Some questions you may be asked include: Do you snore? Is your sleep restless? Do you have daytime sleepiness? Has a partner or spouse told you that you stop breathing during sleep? Have you had trouble concentrating or memory loss? What is your age? What is your neck circumference? To measure your neck, keep your back straight and gently wrap the tape measure around your neck. Put the tape measure at the middle of your neck, between your chin and collarbone. What is your sex assigned at birth? Do you have or are you being treated for high blood pressure? If your screening test is positive, you are at risk for the condition. Further testing may be needed to confirm a diagnosis of sleep apnea. Where to find more information You can find screening tools online or at your health care clinic. For more information about sleep apnea screening and healthy sleep, visit these websites: Centers for Disease Control and Prevention: FootballExhibition.com.br American Sleep Apnea Association: www.sleepapnea.org Contact a health care provider if: You think that you may have sleep apnea. Summary Sleep apnea screening can help determine if you are at risk for sleep apnea. It is important for your health care providers to know whether or not you have sleep apnea, especially if you are having surgery or have other chronic health conditions. You may be asked to take a screening test for sleep apnea in preparation for surgery or as part of a physical exam. This information is not intended to replace advice given to you by your health care provider. Make sure you discuss any questions you have with your health care provider. Document Revised: 12/04/2019 Document Reviewed: 12/04/2019 Elsevier Patient  Education  2024 [employer].

## 2022-09-13 ENCOUNTER — Telehealth: Payer: Self-pay | Admitting: Neurology

## 2022-09-13 NOTE — Telephone Encounter (Signed)
HST- [employer] medicare/medicaid no auth req   Patient is scheduled at [facility] for 09/25/22 at 3 pm  Mailed packet to the patient.

## 2022-09-24 ENCOUNTER — Encounter: Payer: Self-pay | Admitting: Family Medicine

## 2022-09-24 LAB — FECAL OCCULT BLOOD, IMMUNOCHEMICAL: IFOBT: NEGATIVE

## 2022-09-25 ENCOUNTER — Ambulatory Visit (INDEPENDENT_AMBULATORY_CARE_PROVIDER_SITE_OTHER): Payer: 59 | Admitting: Neurology

## 2022-09-25 DIAGNOSIS — G478 Other sleep disorders: Secondary | ICD-10-CM

## 2022-09-25 DIAGNOSIS — G4719 Other hypersomnia: Secondary | ICD-10-CM

## 2022-09-25 DIAGNOSIS — I213 ST elevation (STEMI) myocardial infarction of unspecified site: Secondary | ICD-10-CM

## 2022-09-25 DIAGNOSIS — G4710 Hypersomnia, unspecified: Secondary | ICD-10-CM

## 2022-09-25 DIAGNOSIS — G4730 Sleep apnea, unspecified: Secondary | ICD-10-CM

## 2022-09-25 DIAGNOSIS — R0683 Snoring: Secondary | ICD-10-CM
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
## 2022-10-07 NOTE — Addendum Note (Signed)
Addended by: Melvyn Novas on: 10/07/2022 04:40 PM   Modules accepted: Orders

## 2022-10-09 ENCOUNTER — Telehealth: Payer: Self-pay

## 2022-10-09 NOTE — Telephone Encounter (Signed)
I called pt. I advised pt that Dr. Vickey Huger reviewed their sleep study results and found that pt has moderate complex. Dr. Vickey Huger recommends that pt starts autopap. I reviewed PAP compliance expectations with the pt. Pt is agreeable to starting a CPAP. I advised pt that an order will be sent to a DME, [employer], and [employer] will call the pt within about one week after they file with the pt's insurance. [employer] will show the pt how to use the machine, fit for masks, and troubleshoot the CPAP if needed. A follow up appt was made for insurance purposes with Shanda Bumps  11/25 at 3pm [facility] approved). Pt verbalized understanding to arrive 15 minutes early and bring their CPAP. Pt verbalized understanding of results. Pt had no questions at this time but was encouraged to call back if questions arise. I have sent the order to [employer] and have received confirmation that they have received the order.

## 2022-10-09 NOTE — Telephone Encounter (Signed)
-----   Message from Island Dohmeier sent at 10/07/2022  4:40 PM EDT ----- Moderate complex sleep apnea by AASM and by CMS criteria. Needing to start auto-CPAP to address the dominantly obstructive events, hypoxia and bradycardia. ONO on CPAP within 30 days of therapy and follow up to evaluate for residual AHI.  Due to central apnea proportion and hypoxia , this patient is not a candidate for hypoglossal nerve stimulation. If central apneas rise, may need BIPAP in lab titration.

## 2022-10-09 NOTE — Telephone Encounter (Signed)
Vincent [name], CMA  [name]; [name], [place]; [name], [place] New orders have been placed for the above pt, DOB: 09/01/1955 Thanks  [place], [name], [name], CMA; [name]; [name], [name]; 1 other Received, thank you!

## 2022-10-10 ENCOUNTER — Other Ambulatory Visit: Payer: Self-pay

## 2022-10-10 ENCOUNTER — Encounter (HOSPITAL_BASED_OUTPATIENT_CLINIC_OR_DEPARTMENT_OTHER): Payer: Self-pay | Admitting: Pediatrics

## 2022-10-10 ENCOUNTER — Emergency Department (HOSPITAL_BASED_OUTPATIENT_CLINIC_OR_DEPARTMENT_OTHER)
Admission: EM | Admit: 2022-10-10 | Discharge: 2022-10-10 | Disposition: A | Discharge status: Home / Self Care | Payer: 59 | Attending: Emergency Medicine | Admitting: Emergency Medicine

## 2022-10-10 ENCOUNTER — Emergency Department (HOSPITAL_BASED_OUTPATIENT_CLINIC_OR_DEPARTMENT_OTHER): Payer: 59

## 2022-10-10 ENCOUNTER — Other Ambulatory Visit (HOSPITAL_BASED_OUTPATIENT_CLINIC_OR_DEPARTMENT_OTHER): Payer: Self-pay

## 2022-10-10 DIAGNOSIS — R110 Nausea: Secondary | ICD-10-CM | POA: Diagnosis not present

## 2022-10-10 DIAGNOSIS — R350 Frequency of micturition: Secondary | ICD-10-CM | POA: Diagnosis not present

## 2022-10-10 DIAGNOSIS — E119 Type 2 diabetes mellitus without complications: Secondary | ICD-10-CM | POA: Insufficient documentation

## 2022-10-10 DIAGNOSIS — Z79899 Other long term (current) drug therapy: Secondary | ICD-10-CM | POA: Insufficient documentation

## 2022-10-10 DIAGNOSIS — I10 Essential (primary) hypertension: Secondary | ICD-10-CM | POA: Insufficient documentation

## 2022-10-10 DIAGNOSIS — Z7982 Long term (current) use of aspirin: Secondary | ICD-10-CM | POA: Diagnosis not present

## 2022-10-10 DIAGNOSIS — R0689 Other abnormalities of breathing: Secondary | ICD-10-CM | POA: Insufficient documentation

## 2022-10-10 DIAGNOSIS — K8020 Calculus of gallbladder without cholecystitis without obstruction: Secondary | ICD-10-CM | POA: Diagnosis not present

## 2022-10-10 DIAGNOSIS — I2510 Atherosclerotic heart disease of native coronary artery without angina pectoris: Secondary | ICD-10-CM | POA: Insufficient documentation

## 2022-10-10 DIAGNOSIS — R109 Unspecified abdominal pain: Secondary | ICD-10-CM | POA: Diagnosis not present

## 2022-10-10 DIAGNOSIS — R932 Abnormal findings on diagnostic imaging of liver and biliary tract: Secondary | ICD-10-CM | POA: Diagnosis not present

## 2022-10-10 LAB — COMPREHENSIVE METABOLIC PANEL
ALT: 20 U/L (ref 0–44)
AST: 27 U/L (ref 15–41)
Albumin: 3.9 g/dL (ref 3.5–5.0)
Alkaline Phosphatase: 52 U/L (ref 38–126)
Anion gap: 13 (ref 5–15)
BUN: 16 mg/dL (ref 8–23)
CO2: 27 mmol/L (ref 22–32)
Calcium: 9.1 mg/dL (ref 8.9–10.3)
Chloride: 101 mmol/L (ref 98–111)
Creatinine, Ser: 0.98 mg/dL (ref 0.61–1.24)
GFR, Estimated: >60 mL/min (ref >60)
Glucose, Bld: 136 mg/dL — ABNORMAL HIGH (ref 70–99)
Potassium: 3.3 mmol/L — ABNORMAL LOW (ref 3.5–5.1)
Sodium: 141 mmol/L (ref 135–145)
Total Bilirubin: 0.8 mg/dL (ref 0.3–1.2)
Total Protein: 7 g/dL (ref 6.5–8.1)

## 2022-10-10 LAB — CBC WITH DIFFERENTIAL/PLATELET
Abs Immature Granulocytes: 0.01 10*3/uL (ref 0.00–0.07)
Basophils Absolute: 0 10*3/uL (ref 0.0–0.1)
Basophils Relative: 0 %
Eosinophils Absolute: 0.2 10*3/uL (ref 0.0–0.5)
Eosinophils Relative: 4 %
HCT: 40.7 % (ref 39.0–52.0)
Hemoglobin: 13.4 g/dL (ref 13.0–17.0)
Immature Granulocytes: 0 %
Lymphocytes Relative: 46 %
Lymphs Abs: 2.1 10*3/uL (ref 0.7–4.0)
MCH: 27.7 pg (ref 26.0–34.0)
MCHC: 32.9 g/dL (ref 30.0–36.0)
MCV: 84.3 fL (ref 80.0–100.0)
Monocytes Absolute: 0.5 10*3/uL (ref 0.1–1.0)
Monocytes Relative: 11 %
Neutro Abs: 1.8 10*3/uL (ref 1.7–7.7)
Neutrophils Relative %: 39 %
Platelets: 148 10*3/uL — ABNORMAL LOW (ref 150–400)
RBC: 4.83 MIL/uL (ref 4.22–5.81)
RDW: 14.2 % (ref 11.5–15.5)
WBC: 4.7 10*3/uL (ref 4.0–10.5)
nRBC: 0 % (ref 0.0–0.2)

## 2022-10-10 LAB — URINALYSIS, ROUTINE W REFLEX MICROSCOPIC
Bilirubin Urine: NEGATIVE
Glucose, UA: NEGATIVE mg/dL
Hgb urine dipstick: NEGATIVE
Ketones, ur: NEGATIVE mg/dL
Leukocytes,Ua: NEGATIVE
Nitrite: NEGATIVE
Protein, ur: NEGATIVE mg/dL
Specific Gravity, Urine: >=1.03 (ref 1.005–1.030)
pH: 5 (ref 5.0–8.0)

## 2022-10-10 LAB — LIPASE, BLOOD: Lipase: 24 U/L (ref 11–51)

## 2022-10-10 MED ORDER — ONDANSETRON HCL 4 MG/2ML IJ SOLN
4.0000 mg | Freq: Once | INTRAMUSCULAR | Status: AC
  Administered 2022-10-10: 4 mg via INTRAVENOUS
  Filled 2022-10-10: qty 2

## 2022-10-10 MED ORDER — HYDROMORPHONE HCL 1 MG/ML IJ SOLN
0.5000 mg | Freq: Once | INTRAMUSCULAR | Status: AC
  Administered 2022-10-10: 0.5 mg via INTRAVENOUS
  Filled 2022-10-10: qty 1

## 2022-10-10 MED ORDER — SODIUM CHLORIDE 0.9 % IV BOLUS
500.0000 mL | Freq: Once | INTRAVENOUS | Status: AC
  Administered 2022-10-10: 500 mL via INTRAVENOUS

## 2022-10-10 MED ORDER — IOHEXOL 350 MG/ML SOLN
100.0000 mL | Freq: Once | INTRAVENOUS | Status: AC | PRN
  Administered 2022-10-10: 100 mL via INTRAVENOUS

## 2022-10-10 MED ORDER — MORPHINE SULFATE (PF) 4 MG/ML IV SOLN
4.0000 mg | Freq: Once | INTRAVENOUS | Status: AC
  Administered 2022-10-10: 4 mg via INTRAVENOUS
  Filled 2022-10-10: qty 1

## 2022-10-10 MED ORDER — ONDANSETRON 4 MG PO TBDP
4.0000 mg | ORAL_TABLET | Freq: Three times a day (TID) | ORAL | 0 refills | Status: DC | PRN
  Filled 2022-10-10: qty 12, 4d supply, fill #0

## 2022-10-10 MED ORDER — HYDROMORPHONE HCL 1 MG/ML IJ SOLN: 1.0000 mg | Freq: Once | INTRAMUSCULAR | Status: DC

## 2022-10-10 MED ORDER — HYDROCODONE-ACETAMINOPHEN 5-325 MG PO TABS
1.0000 | ORAL_TABLET | ORAL | 0 refills | Status: DC | PRN
  Filled 2022-10-10: qty 10, 2d supply, fill #0

## 2022-10-10 NOTE — Discharge Instructions (Addendum)
Follow up with general surgery, please call to schedule an appointment. Take Norco as needed as prescribed for pain.  Do not drive or operate machinery if taking this medication.  This medication can cause constipation, MiraLAX as needed. Take Zofran as needed as prescribed for nausea and vomiting.  Return to the emergency room for fever of 100.4 or higher, pain or vomiting not controlled with medications.

## 2022-10-10 NOTE — ED Triage Notes (Signed)
C/O right sided flank pain started 2-3 days ago and has worsen, denies any radiating pain and dysuria.

## 2022-10-10 NOTE — ED Provider Notes (Signed)
[place] [facility] Provider Note   CSN: [id number] Arrival date & time: 10/10/22  4332     History  Chief Complaint  Patient presents with   Flank Pain    Jamari Pellicane is a 67 y.o. male.  67 year old male with past medical history of hypertension, hyperlipidemia, CAD/MI, diabetes presents with complaint of right flank pain.  Notes pain started about 3 days ago without injury, has become progressively worse, unable to find position of comfort.  Reports nausea without vomiting.  Pain worse today after eating cereal with 2% mild. States he has had urinary frequency without dysuria or hematuria.  No changes in bowel habits.  No history of kidney stones.  No other complaints or concerns.       Home Medications Prior to Admission medications   Medication Sig Start Date End Date Taking? Authorizing Provider  amLODIPine-Valsartan-HCTZ 10-320-25 MG TABS Take 1 tablet by mouth daily. 08/08/22  Yes Sharlene Dory, DO  aspirin 81 MG chewable tablet Chew 1 tablet (81 mg total) by mouth daily. 01/25/17  Yes Bing Neighbors, NP  atorvastatin (LIPITOR) 80 MG tablet Take 1 tablet (80 mg total) by mouth daily at 6 PM. Patient taking differently: Take 80 mg by mouth in the morning. 07/17/22 07/17/23 Yes Wendling, Jilda Roche, DO  diltiazem (CARDIZEM CD) 240 MG 24 hr capsule Take 1 capsule (240 mg total) by mouth daily. 08/03/21  Yes Baldo Daub, MD  HYDROcodone-acetaminophen (NORCO/VICODIN) 5-325 MG tablet Take 1 tablet by mouth every 4 (four) hours as needed. 10/10/22  Yes Jeannie Fend, PA-C  nitroGLYCERIN (NITROSTAT) 0.4 MG SL tablet Place 1 tablet (0.4 mg total) under the tongue every 5 (five) minutes as needed for chest pain. 08/03/21  Yes Baldo Daub, MD  ondansetron (ZOFRAN-ODT) 4 MG disintegrating tablet Take 1 tablet (4 mg total) by mouth every 8 (eight) hours as needed for nausea or vomiting. 10/10/22  Yes Jeannie Fend, PA-C   oxybutynin (DITROPAN XL) 5 MG 24 hr tablet Take 1 tablet (5 mg total) by mouth at bedtime. Patient taking differently: Take 5 mg by mouth in the morning. 08/08/22  Yes Wendling, Jilda Roche, DO  potassium chloride (KLOR-CON 10) 10 MEQ tablet Take 1 tablet (10 mEq total) by mouth daily. 08/08/22  Yes Sharlene Dory, DO  tamsulosin (FLOMAX) 0.4 MG CAPS capsule Take 1 capsule (0.4 mg total) by mouth daily. 07/18/22  Yes Sharlene Dory, DO  diclofenac Sodium (VOLTAREN ARTHRITIS PAIN) 1 % GEL Apply 2 grams topically 4 (four) times daily. Patient not taking: Reported on 09/06/2022 07/26/21   Sharlene Dory, DO  levocetirizine (XYZAL) 5 MG tablet Take 1 tablet (5 mg total) by mouth every evening. Patient not taking: Reported on 10/10/2022 07/18/22   Sharlene Dory, DO  metoprolol tartrate (LOPRESSOR) 100 MG tablet Take 1 tablet (100 mg total) by mouth once for 1 dose. Please take 2 hours before CT Patient not taking: Reported on 10/10/2022 08/03/21 10/10/22  Baldo Daub, MD      Allergies    Patient has no known allergies.    Review of Systems   Review of Systems Negative except as per HPI Physical Exam Updated Vital Signs BP (!) 106/54   Pulse (!) 56   Temp 98 F (36.7 C) (Oral)   Resp 20   Ht 5\' 11"  (1.803 m)   Wt 104.3 kg   SpO2 100%   BMI 32.08 kg/m  Physical Exam Vitals and nursing note reviewed.  Constitutional:      General: He is not in acute distress.    Appearance: He is well-developed. He is not diaphoretic.     Comments: Appears uncomfortable, rolling on the bed  HENT:     Head: Normocephalic and atraumatic.  Cardiovascular:     Rate and Rhythm: Normal rate and regular rhythm.     Pulses: Normal pulses.     Heart sounds: Normal heart sounds.  Pulmonary:     Effort: Pulmonary effort is normal.     Breath sounds: Normal breath sounds.  Abdominal:     Palpations: Abdomen is soft.     Tenderness: There is abdominal tenderness in the  right upper quadrant and right lower quadrant. There is no guarding or rebound.  Musculoskeletal:     Right lower leg: No edema.     Left lower leg: No edema.  Skin:    General: Skin is warm and dry.     Findings: No erythema or rash.  Neurological:     Mental Status: He is alert and oriented to person, place, and time.  Psychiatric:        Behavior: Behavior normal.     ED Results / Procedures / Treatments   Labs (all labs ordered are listed, but only abnormal results are displayed) Labs Reviewed  CBC WITH DIFFERENTIAL/PLATELET - Abnormal; Notable for the following components:      Result Value   Platelets 148 (*)    All other components within normal limits  COMPREHENSIVE METABOLIC PANEL - Abnormal; Notable for the following components:   Potassium 3.3 (*)    Glucose, Bld 136 (*)    All other components within normal limits  URINALYSIS, ROUTINE W REFLEX MICROSCOPIC  LIPASE, BLOOD    EKG None  Radiology CT Angio Chest/Abd/Pel for Dissection W and/or W/WO  Result Date: 10/10/2022 CLINICAL DATA:  Right flank pain for the past 3 days,, progressively worse. Nausea. Urinary frequency. Clinical concern for acute aortic syndrome. EXAM: CT ANGIOGRAPHY CHEST, ABDOMEN AND PELVIS TECHNIQUE: Non-contrast CT of the chest was initially obtained. Multidetector CT imaging through the chest, abdomen and pelvis was performed using the standard protocol during bolus administration of intravenous contrast. Multiplanar reconstructed images and MIPs were obtained and reviewed to evaluate the vascular anatomy. RADIATION DOSE REDUCTION: This exam was performed according to the departmental dose-optimization program which includes automated exposure control, adjustment of the mA and/or kV according to patient size and/or use of iterative reconstruction technique. CONTRAST:  OMNIPAQUE IOHEXOL 350 MG/ML SOLN COMPARISON:  Noncontrast abdomen and pelvis CT obtained earlier today. FINDINGS: CTA CHEST  FINDINGS Cardiovascular: Preferential opacification of the thoracic aorta. No evidence of thoracic aortic aneurysm or dissection. Normal heart size. No pericardial effusion. Atheromatous calcifications, including the coronary arteries and aorta. Normally opacified pulmonary arteries with no pulmonary arterial filling defects seen. Mediastinum/Nodes: No enlarged mediastinal, hilar, or axillary lymph nodes. Thyroid gland, trachea, and esophagus demonstrate no significant findings. Lungs/Pleura: Lungs are clear. No pleural effusion or pneumothorax. Musculoskeletal: Lower cervical spine degenerative changes. Mild thoracic spine degenerative changes. Review of the MIP images confirms the above findings. CTA ABDOMEN AND PELVIS FINDINGS VASCULAR Aorta: Atheromatous calcifications without aneurysmal dissection. Celiac: Calcified plaque at the origin of the celiac axis causing approximately 50% stenosis. SMA: Calcified plaque at the origin without significant luminal stenosis. Renals: Mild bilateral renal artery calcifications without luminal stenosis. IMA: Mild calcifications at the origin without luminal stenosis. Inflow: Calcified and  noncalcified plaques without significant luminal stenosis, aneurysm or dissection. Veins: No obvious venous abnormality within the limitations of this arterial phase study. Review of the MIP images confirms the above findings. NON-VASCULAR Hepatobiliary: 1.5 cm gallstone in the gallbladder without gallbladder wall thickening or pericholecystic fluid. Stable small liver cysts. These do not need imaging follow-up. Pancreas: Unremarkable. No pancreatic ductal dilatation or surrounding inflammatory changes. Spleen: Normal in size without focal abnormality. Adrenals/Urinary Tract: Normal-appearing adrenal glands. Exophytic simple right renal cyst. This does not need imaging follow-up. Unremarkable left kidney, ureters and urinary bladder. Stomach/Bowel: Stomach is within normal limits. Appendix  appears normal. No evidence of bowel wall thickening, distention, or inflammatory changes. The right colon continues to extend across the abdomen with the cecum and appendix in the left lower quadrant anteriorly. Lymphatic: No enlarged lymph nodes. Reproductive: Moderately enlarged prostate gland protruding into the base of the urinary bladder. Other: No abdominal wall hernia or abnormality. No abdominopelvic ascites. Musculoskeletal: Lumbar spine degenerative changes. Review of the MIP images confirms the above findings. IMPRESSION: 1. No evidence of aortic aneurysm or dissection. 2. No acute abnormality in the chest, abdomen or pelvis. 3. Cholelithiasis. 4. Moderately enlarged prostate gland protruding into the base of the urinary bladder. 5.  Calcific coronary artery and aortic atherosclerosis. Aortic Atherosclerosis (ICD10-I70.0). Electronically Signed   By: Beckie Salts M.D.   On: 10/10/2022 13:49   CT Renal Stone Study  Result Date: 10/10/2022 CLINICAL DATA:  Right flank pain for several days. EXAM: CT ABDOMEN AND PELVIS WITHOUT CONTRAST TECHNIQUE: Multidetector CT imaging of the abdomen and pelvis was performed following the standard protocol without IV contrast. RADIATION DOSE REDUCTION: This exam was performed according to the departmental dose-optimization program which includes automated exposure control, adjustment of the mA and/or kV according to patient size and/or use of iterative reconstruction technique. COMPARISON:  None Available. FINDINGS: Lower chest: No acute abnormality. Hepatobiliary: Cholelithiasis. No biliary dilatation. Multiple low densities are noted in the left hepatic lobe may represent cysts, but other pathology cannot be excluded. Pancreas: Unremarkable. No pancreatic ductal dilatation or surrounding inflammatory changes. Spleen: Normal in size without focal abnormality. Adrenals/Urinary Tract: Adrenal glands are unremarkable. Kidneys are normal, without renal calculi, focal  lesion, or hydronephrosis. Bladder is unremarkable. Stomach/Bowel: Stomach is within normal limits. Appendix appears normal. No evidence of bowel wall thickening, distention, or inflammatory changes. Vascular/Lymphatic: Aortic atherosclerosis. No enlarged abdominal or pelvic lymph nodes. Reproductive: Moderate prostatic enlargement is noted. Other: No abdominal wall hernia or abnormality. No abdominopelvic ascites. Musculoskeletal: No acute or significant osseous findings. IMPRESSION: Cholelithiasis. Multiple small low densities are noted in left hepatic lobe which may represent cysts, but other pathology cannot be excluded. When the patient is clinically stable and able to follow directions and hold their breath (preferably as an outpatient) further evaluation with dedicated abdominal MRI should be considered. Moderate prostatic enlargement. Aortic Atherosclerosis (ICD10-I70.0). Electronically Signed   By: Lupita Raider M.D.   On: 10/10/2022 11:04    Procedures Procedures    Medications Ordered in ED Medications  ondansetron (ZOFRAN) injection 4 mg (4 mg Intravenous Given 10/10/22 1014)  morphine (PF) 4 MG/ML injection 4 mg (4 mg Intravenous Given 10/10/22 1014)  sodium chloride 0.9 % bolus 500 mL (0 mLs Intravenous Stopped 10/10/22 1129)  HYDROmorphone (DILAUDID) injection 0.5 mg (0.5 mg Intravenous Given 10/10/22 1128)  iohexol (OMNIPAQUE) 350 MG/ML injection 100 mL (100 mLs Intravenous Contrast Given 10/10/22 1236)    ED Course/ Medical Decision Making/ A&P  Medical Decision Making Amount and/or Complexity of Data Reviewed Labs: ordered. Radiology: ordered.  Risk Prescription drug management.   This patient presents to the ED for concern of right flank pain, this involves an extensive number of treatment options, and is a complaint that carries with it a high risk of complications and morbidity.  The differential diagnosis includes but not limited to kidney  stone, acute cholelithiasis, SBO, colitis, diverticulitis, appendicitis    Co morbidities that complicate the patient evaluation   hypertension, hyperlipidemia, CAD/MI, diabetes    Additional history obtained:  Additional history obtained from girlfriend at bedside who contributes to history as above External records from outside source obtained and reviewed including prior labs on file for comparison   Lab Tests:  I Ordered, and personally interpreted labs.  The pertinent results include: Lipase within normal notes.  CMP with mild hypokalemia at 3.3.  Urinalysis is unremarkable.  CBC without significant findings.   Imaging Studies ordered:  I ordered imaging studies including CT stone study, CT angio for dissection I independently visualized and interpreted imaging which showed found to have gallstone without gallbladder wall thickening or gallbladder duct dilatation, negative for kidney stones I agree with the radiologist interpretation    Consultations Obtained:  I requested consultation with the ER attending, Dr. Jarold Motto,  and discussed lab and imaging findings as well as pertinent plan - they recommend: Agrees with plan of care   Problem List / ED Course / Critical interventions / Medication management  67 year old male with right flank pain as above.  On exam, appears uncomfortable, rolling around on the bed.  There is no tenderness with palpation of his back.  Does have right sided abdominal pain with palpation, no guarding or rebound.  CT noncontrast scan for kidney stone shows gallstone without other acute findings.  Followed with CT angio study to further evaluate given patient's medical history and ongoing pain.  No significant findings, gallstone without acute cholecystitis.  Labs with normal/reassuring findings.  Pain is controlled.  Discussed results with patient.  Recommend follow-up with general surgery and recheck with PCP.  Discharged with Norco, Zofran.  Advised  to return to the ER for pain or vomiting not controlled with medications or fever at any time. I ordered medication including morphine, Zofran, Dilaudid, IV fluids for pain, nausea Reevaluation of the patient after these medicines showed that the patient improved I have reviewed the patients home medicines and have made adjustments as needed   Social Determinants of Health:  Has PCP   Test / Admission - Considered:  Stable for discharge with return to ER precautions         Final Clinical Impression(s) / ED Diagnoses Final diagnoses:  Calculus of gallbladder without cholecystitis without obstruction    Rx / DC Orders ED Discharge Orders          Ordered    HYDROcodone-acetaminophen (NORCO/VICODIN) 5-325 MG tablet  Every 4 hours PRN        10/10/22 1359    ondansetron (ZOFRAN-ODT) 4 MG disintegrating tablet  Every 8 hours PRN        10/10/22 1359              Jeannie Fend, PA-C 10/10/22 1409    Rondel Baton, MD 10/10/22 2007

## 2022-10-10 NOTE — ED Notes (Signed)
Patient transported back to CT at 12:37pm. Dyanne Carrel, RN notified.

## 2022-10-12 ENCOUNTER — Ambulatory Visit (INDEPENDENT_AMBULATORY_CARE_PROVIDER_SITE_OTHER): Payer: 59 | Admitting: Family Medicine

## 2022-10-12 ENCOUNTER — Other Ambulatory Visit: Payer: Self-pay | Admitting: Family Medicine

## 2022-10-12 ENCOUNTER — Other Ambulatory Visit (HOSPITAL_BASED_OUTPATIENT_CLINIC_OR_DEPARTMENT_OTHER): Payer: Self-pay

## 2022-10-12 ENCOUNTER — Encounter: Payer: Self-pay | Admitting: Family Medicine

## 2022-10-12 VITALS — BP 121/79 | HR 62 | Temp 98.0°F | Ht 71.5 in | Wt 225.4 lb

## 2022-10-12 DIAGNOSIS — M545 Low back pain, unspecified: Secondary | ICD-10-CM

## 2022-10-12 MED ORDER — METHYLPREDNISOLONE ACETATE 80 MG/ML IJ SUSP
80.0000 mg | Freq: Once | INTRAMUSCULAR | Status: AC
Start: 2022-10-12 — End: 2022-10-12
  Administered 2022-10-12: 80 mg via INTRAMUSCULAR

## 2022-10-12 MED ORDER — MELOXICAM 15 MG PO TABS
15.0000 mg | ORAL_TABLET | Freq: Every day | ORAL | 0 refills | Status: DC
Start: 2022-10-12 — End: 2023-02-26
  Filled 2022-10-12: qty 30, 30d supply, fill #0

## 2022-10-12 MED ORDER — POTASSIUM CHLORIDE ER 10 MEQ PO TBCR
10.0000 meq | EXTENDED_RELEASE_TABLET | Freq: Every day | ORAL | 1 refills | Status: DC
  Filled 2022-10-12: qty 90, 90d supply, fill #0

## 2022-10-12 MED ORDER — TIZANIDINE HCL 4 MG PO TABS
4.0000 mg | ORAL_TABLET | Freq: Four times a day (QID) | ORAL | 0 refills | Status: DC | PRN
Start: 2022-10-12 — End: 2023-02-26
  Filled 2022-10-12: qty 20, 5d supply, fill #0
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
## 2022-10-12 NOTE — Addendum Note (Signed)
Addended by: Scharlene Gloss B on: 10/12/2022 08:50 AM   Modules accepted: Orders

## 2022-10-12 NOTE — Patient Instructions (Addendum)

## 2022-10-15 ENCOUNTER — Other Ambulatory Visit: Payer: Self-pay

## 2022-10-15 ENCOUNTER — Other Ambulatory Visit (HOSPITAL_BASED_OUTPATIENT_CLINIC_OR_DEPARTMENT_OTHER): Payer: Self-pay

## 2022-10-17 DIAGNOSIS — G4733 Obstructive sleep apnea (adult) (pediatric): Secondary | ICD-10-CM | POA: Diagnosis not present

## 2022-10-21 ENCOUNTER — Encounter (HOSPITAL_BASED_OUTPATIENT_CLINIC_OR_DEPARTMENT_OTHER): Payer: Self-pay

## 2022-10-21 ENCOUNTER — Emergency Department (HOSPITAL_BASED_OUTPATIENT_CLINIC_OR_DEPARTMENT_OTHER)
Admission: EM | Admit: 2022-10-21 | Discharge: 2022-10-21 | Disposition: A | Discharge status: Home / Self Care | Payer: 59 | Attending: Emergency Medicine | Admitting: Emergency Medicine

## 2022-10-21 ENCOUNTER — Other Ambulatory Visit: Payer: Self-pay

## 2022-10-21 DIAGNOSIS — Y92410 Unspecified street and highway as the place of occurrence of the external cause: Secondary | ICD-10-CM | POA: Diagnosis not present

## 2022-10-21 DIAGNOSIS — Z7982 Long term (current) use of aspirin: Secondary | ICD-10-CM | POA: Insufficient documentation

## 2022-10-21 DIAGNOSIS — S39012A Strain of muscle, fascia and tendon of lower back, initial encounter: Secondary | ICD-10-CM | POA: Insufficient documentation

## 2022-10-21 DIAGNOSIS — M542 Cervicalgia: Secondary | ICD-10-CM | POA: Diagnosis present

## 2022-10-21 DIAGNOSIS — S161XXA Strain of muscle, fascia and tendon at neck level, initial encounter: Secondary | ICD-10-CM | POA: Diagnosis not present
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
## 2022-10-24 DIAGNOSIS — M542 Cervicalgia: Secondary | ICD-10-CM | POA: Diagnosis not present

## 2022-10-24 DIAGNOSIS — S299XXA Unspecified injury of thorax, initial encounter: Secondary | ICD-10-CM | POA: Diagnosis not present

## 2022-10-24 DIAGNOSIS — M4310 Spondylolisthesis, site unspecified: Secondary | ICD-10-CM | POA: Diagnosis not present

## 2022-10-24 DIAGNOSIS — M47816 Spondylosis without myelopathy or radiculopathy, lumbar region: Secondary | ICD-10-CM | POA: Diagnosis not present

## 2022-10-24 DIAGNOSIS — M549 Dorsalgia, unspecified: Secondary | ICD-10-CM | POA: Diagnosis not present

## 2022-10-24 DIAGNOSIS — M5126 Other intervertebral disc displacement, lumbar region: Secondary | ICD-10-CM | POA: Diagnosis not present

## 2022-10-24 DIAGNOSIS — M47812 Spondylosis without myelopathy or radiculopathy, cervical region: Secondary | ICD-10-CM | POA: Diagnosis not present

## 2022-11-20 ENCOUNTER — Other Ambulatory Visit (HOSPITAL_BASED_OUTPATIENT_CLINIC_OR_DEPARTMENT_OTHER): Payer: Self-pay

## 2022-11-30 ENCOUNTER — Other Ambulatory Visit: Payer: Self-pay | Admitting: Cardiology

## 2022-11-30 ENCOUNTER — Other Ambulatory Visit (HOSPITAL_BASED_OUTPATIENT_CLINIC_OR_DEPARTMENT_OTHER): Payer: Self-pay

## 2022-12-03 ENCOUNTER — Other Ambulatory Visit: Payer: Self-pay

## 2022-12-03 ENCOUNTER — Other Ambulatory Visit (HOSPITAL_BASED_OUTPATIENT_CLINIC_OR_DEPARTMENT_OTHER): Payer: Self-pay

## 2022-12-03 ENCOUNTER — Encounter: Payer: Self-pay | Admitting: Adult Health

## 2022-12-03 ENCOUNTER — Encounter: Payer: 59 | Admitting: Adult Health

## 2022-12-03 MED ORDER — DILTIAZEM HCL ER COATED BEADS 240 MG PO CP24
240.0000 mg | ORAL_CAPSULE | Freq: Every day | ORAL | 0 refills | Status: DC
  Filled 2022-12-03: qty 30, 30d supply, fill #0
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
## 2022-12-09 DIAGNOSIS — R0902 Hypoxemia: Secondary | ICD-10-CM | POA: Diagnosis not present

## 2022-12-09 DIAGNOSIS — G4730 Sleep apnea, unspecified: Secondary | ICD-10-CM | POA: Diagnosis not present

## 2022-12-09 DIAGNOSIS — G4733 Obstructive sleep apnea (adult) (pediatric): Secondary | ICD-10-CM | POA: Diagnosis not present

## 2022-12-10 ENCOUNTER — Other Ambulatory Visit (HOSPITAL_BASED_OUTPATIENT_CLINIC_OR_DEPARTMENT_OTHER): Payer: Self-pay

## 2022-12-14 ENCOUNTER — Ambulatory Visit: Payer: 59 | Admitting: Family Medicine

## 2022-12-14 ENCOUNTER — Other Ambulatory Visit (HOSPITAL_BASED_OUTPATIENT_CLINIC_OR_DEPARTMENT_OTHER): Payer: Self-pay

## 2022-12-17 ENCOUNTER — Other Ambulatory Visit (HOSPITAL_BASED_OUTPATIENT_CLINIC_OR_DEPARTMENT_OTHER): Payer: Self-pay

## 2022-12-18 ENCOUNTER — Other Ambulatory Visit (HOSPITAL_BASED_OUTPATIENT_CLINIC_OR_DEPARTMENT_OTHER): Payer: Self-pay

## 2022-12-26 ENCOUNTER — Telehealth: Payer: Self-pay | Admitting: Neurology

## 2022-12-26 ENCOUNTER — Encounter: Payer: Self-pay | Admitting: Pharmacist

## 2022-12-26 DIAGNOSIS — G4719 Other hypersomnia: Secondary | ICD-10-CM

## 2022-12-26 DIAGNOSIS — G478 Other sleep disorders: Secondary | ICD-10-CM

## 2022-12-26 DIAGNOSIS — R0683 Snoring: Secondary | ICD-10-CM

## 2022-12-26 DIAGNOSIS — G4730 Sleep apnea, unspecified: Secondary | ICD-10-CM

## 2022-12-26 NOTE — Progress Notes (Signed)
Pharmacy Quality Measure Review  This patient is appearing on a report for being at risk of failing the adherence measure for hypertension (ACEi/ARB) medications this calendar year.   Medication: amlodipine / valsartan / HCTZ Last fill date: 08/08/2022 for 90 day supply  Checked Dr Tiajuana Amass database and amlodipine / valsartan / hydrochlorothiazide was filled for 90 DS on 12/17/2022 at [facility] report was not up to date. No action needed at this time.   Henrene Pastor, PharmD Clinical Pharmacist [facility]

## 2022-12-26 NOTE — Telephone Encounter (Signed)
Received a ONO on CPAP report for the patient that was completed on 12/09/22 1:28 am until 12/09/22 6:36 am (3 hours 11 min 20 sec) During the recording the lowest oxygen level dropped was 89%. Pt doesn't appear to need oxygen bled into CPAP. Will send to Dr Vickey Huger to review

## 2022-12-27 NOTE — Telephone Encounter (Signed)
Per Dr Dohmeier this data shows quite a bit of artifact and not enough recorded data to determine if accurate. She would recommend repeating the ONO on CPAP.

## 2022-12-27 NOTE — Addendum Note (Signed)
Addended by: Judi Cong on: 12/27/2022 12:39 PM   Modules accepted: Orders

## 2022-12-31 NOTE — Telephone Encounter (Signed)
Called patient to informed that Dr Vickey Huger would like to recommend repeating the ONO on CPAP. I told patient [employer] would reach back out to him to reschedule another Study. Pt verbalized understanding. Pt had no questions at this time but was encouraged to call back if questions arise.

## 2023-01-17 DIAGNOSIS — G4733 Obstructive sleep apnea (adult) (pediatric): Secondary | ICD-10-CM | POA: Diagnosis not present

## 2023-01-18 DIAGNOSIS — G4733 Obstructive sleep apnea (adult) (pediatric): Secondary | ICD-10-CM | POA: Diagnosis not present

## 2023-01-21 ENCOUNTER — Other Ambulatory Visit (HOSPITAL_BASED_OUTPATIENT_CLINIC_OR_DEPARTMENT_OTHER): Payer: Self-pay

## 2023-01-21 ENCOUNTER — Other Ambulatory Visit: Payer: Self-pay

## 2023-01-21 ENCOUNTER — Emergency Department (HOSPITAL_BASED_OUTPATIENT_CLINIC_OR_DEPARTMENT_OTHER)
Admission: EM | Admit: 2023-01-21 | Discharge: 2023-01-21 | Disposition: A | Discharge status: Home / Self Care | Payer: 59 | Attending: Emergency Medicine | Admitting: Emergency Medicine

## 2023-01-21 DIAGNOSIS — R197 Diarrhea, unspecified: Secondary | ICD-10-CM | POA: Diagnosis not present

## 2023-01-21 DIAGNOSIS — E119 Type 2 diabetes mellitus without complications: Secondary | ICD-10-CM | POA: Diagnosis not present

## 2023-01-21 DIAGNOSIS — Z7982 Long term (current) use of aspirin: Secondary | ICD-10-CM | POA: Insufficient documentation

## 2023-01-21 DIAGNOSIS — I2510 Atherosclerotic heart disease of native coronary artery without angina pectoris: Secondary | ICD-10-CM | POA: Insufficient documentation

## 2023-01-21 DIAGNOSIS — Z20822 Contact with and (suspected) exposure to covid-19: Secondary | ICD-10-CM | POA: Insufficient documentation

## 2023-01-21 DIAGNOSIS — R112 Nausea with vomiting, unspecified: Secondary | ICD-10-CM | POA: Insufficient documentation

## 2023-01-21 DIAGNOSIS — I10 Essential (primary) hypertension: Secondary | ICD-10-CM | POA: Diagnosis not present

## 2023-01-21 LAB — COMPREHENSIVE METABOLIC PANEL
ALT: 22 U/L (ref 0–44)
AST: 23 U/L (ref 15–41)
Albumin: 4 g/dL (ref 3.5–5.0)
Alkaline Phosphatase: 59 U/L (ref 38–126)
Anion gap: 8 (ref 5–15)
BUN: 11 mg/dL (ref 8–23)
CO2: 30 mmol/L (ref 22–32)
Calcium: 9 mg/dL (ref 8.9–10.3)
Chloride: 101 mmol/L (ref 98–111)
Creatinine, Ser: 0.86 mg/dL (ref 0.61–1.24)
GFR, Estimated: >60 mL/min (ref >60)
Glucose, Bld: 155 mg/dL — ABNORMAL HIGH (ref 70–99)
Potassium: 3.5 mmol/L (ref 3.5–5.1)
Sodium: 139 mmol/L (ref 135–145)
Total Bilirubin: 0.3 mg/dL (ref 0.0–1.2)
Total Protein: 7.5 g/dL (ref 6.5–8.1)

## 2023-01-21 LAB — RESP PANEL BY RT-PCR (RSV, FLU A&B, COVID)  RVPGX2
Influenza A by PCR: NEGATIVE
Influenza B by PCR: NEGATIVE
Resp Syncytial Virus by PCR: NEGATIVE
SARS Coronavirus 2 by RT PCR: NEGATIVE

## 2023-01-21 LAB — CBC WITH DIFFERENTIAL/PLATELET
Abs Immature Granulocytes: 0.01 10*3/uL (ref 0.00–0.07)
Basophils Absolute: 0 10*3/uL (ref 0.0–0.1)
Basophils Relative: 1 %
Eosinophils Absolute: 0.2 10*3/uL (ref 0.0–0.5)
Eosinophils Relative: 4 %
HCT: 42.5 % (ref 39.0–52.0)
Hemoglobin: 13.8 g/dL (ref 13.0–17.0)
Immature Granulocytes: 0 %
Lymphocytes Relative: 37 %
Lymphs Abs: 1.5 10*3/uL (ref 0.7–4.0)
MCH: 27.4 pg (ref 26.0–34.0)
MCHC: 32.5 g/dL (ref 30.0–36.0)
MCV: 84.3 fL (ref 80.0–100.0)
Monocytes Absolute: 0.4 10*3/uL (ref 0.1–1.0)
Monocytes Relative: 9 %
Neutro Abs: 2 10*3/uL (ref 1.7–7.7)
Neutrophils Relative %: 49 %
Platelets: 102 10*3/uL — ABNORMAL LOW (ref 150–400)
RBC: 5.04 MIL/uL (ref 4.22–5.81)
RDW: 14.1 % (ref 11.5–15.5)
WBC: 4 10*3/uL (ref 4.0–10.5)
nRBC: 0 % (ref 0.0–0.2)

## 2023-01-21 MED ORDER — ONDANSETRON 4 MG PO TBDP: 8.0000 mg | ORAL_TABLET | Freq: Once | ORAL | Status: DC

## 2023-01-21 MED ORDER — LORAZEPAM 2 MG/ML IJ SOLN
0.5000 mg | Freq: Once | INTRAMUSCULAR | Status: AC
  Administered 2023-01-21: 0.5 mg via INTRAVENOUS
  Filled 2023-01-21: qty 1

## 2023-01-21 MED ORDER — ONDANSETRON 4 MG PO TBDP
4.0000 mg | ORAL_TABLET | Freq: Three times a day (TID) | ORAL | 0 refills | Status: DC | PRN
  Filled 2023-01-21: qty 20, 7d supply, fill #0

## 2023-01-21 MED ORDER — ONDANSETRON 4 MG PO TBDP
4.0000 mg | ORAL_TABLET | Freq: Once | ORAL | Status: AC
  Administered 2023-01-21: 4 mg via ORAL
  Filled 2023-01-21: qty 1

## 2023-01-21 MED ORDER — ONDANSETRON HCL 4 MG/2ML IJ SOLN
4.0000 mg | Freq: Once | INTRAMUSCULAR | Status: AC
  Administered 2023-01-21: 4 mg via INTRAVENOUS
  Filled 2023-01-21: qty 2

## 2023-01-21 NOTE — Discharge Instructions (Signed)
 Take Zofran as needed for any nausea or vomiting but do not take another dose until around 2:30-3 PM.  Lab work today was unremarkable.  Have written you out of work.  I suspect he may have some food poisoning or some other viral process.

## 2023-01-21 NOTE — ED Triage Notes (Signed)
 Pt reports nausea that began this morning and one episode of diarrhea. Pt denies any abdominal pain or other symptoms.

## 2023-01-21 NOTE — ED Provider Notes (Signed)
 [facility] Provider Note   CSN: [id number] Arrival date & time: 01/21/23  0620     History  Chief Complaint  Patient presents with   Nausea    Vincent [name] is a 68 y.o. male.  Patient here with episode of nausea and vomiting.  History of diabetes high cholesterol CAD.  Denies any chest pain abdominal pain.  Had 1 episode of diarrhea as well.  May be suspicious food intake.  No sick contacts that he is aware of.  Nothing makes it worse or better.  Denies any weakness numbness tingling.  No headaches.  No pain with urination.  The history is provided by the patient.       Home Medications Prior to Admission medications   Medication Sig Start Date End Date Taking? Authorizing Provider  ondansetron  (ZOFRAN -ODT) 4 MG disintegrating tablet Take 1 tablet (4 mg total) by mouth every 8 (eight) hours as needed. 01/21/23  Yes Ayano Douthitt, DO  amLODIPine -Valsartan -HCTZ 10-320-25 MG TABS Take 1 tablet by mouth daily. 08/08/22   Frann Mabel Mt, DO  aspirin  81 MG chewable tablet Chew 1 tablet (81 mg total) by mouth daily. 01/25/17   Arloa Suzen RAMAN, NP  atorvastatin  (LIPITOR ) 80 MG tablet Take 1 tablet (80 mg total) by mouth daily at 6 PM. Patient taking differently: Take 80 mg by mouth in the morning. 07/17/22 07/17/23  Frann Mabel Mt, DO  diclofenac  Sodium (VOLTAREN  ARTHRITIS PAIN) 1 % GEL Apply 2 grams topically 4 (four) times daily. 07/26/21   Frann Mabel Mt, DO  diltiazem  (CARDIZEM  CD) 240 MG 24 hr capsule Take 1 capsule (240 mg total) by mouth daily. *Need appointment for further refills; 1st attempt.* 12/03/22   Monetta Redell PARAS, MD  levocetirizine (XYZAL ) 5 MG tablet Take 1 tablet (5 mg total) by mouth every evening. 07/18/22   Frann Mabel Mt, DO  meloxicam  (MOBIC ) 15 MG tablet Take 1 tablet (15 mg total) by mouth daily. 10/12/22   Frann Mabel Mt, DO  metoprolol  tartrate (LOPRESSOR ) 100 MG tablet Take  1 tablet (100 mg total) by mouth once for 1 dose. Please take 2 hours before CT Patient not taking: Reported on 10/10/2022 08/03/21 10/10/22  Monetta Redell PARAS, MD  nitroGLYCERIN  (NITROSTAT ) 0.4 MG SL tablet Place 1 tablet (0.4 mg total) under the tongue every 5 (five) minutes as needed for chest pain. 08/03/21   Monetta Redell PARAS, MD  oxybutynin  (DITROPAN  XL) 5 MG 24 hr tablet Take 1 tablet (5 mg total) by mouth at bedtime. Patient taking differently: Take 5 mg by mouth in the morning. 08/08/22   Wendling, Mabel Mt, DO  potassium chloride  (KLOR-CON  10) 10 MEQ tablet Take 1 tablet (10 mEq total) by mouth daily. 10/12/22   Frann Mabel Mt, DO  tamsulosin  (FLOMAX ) 0.4 MG CAPS capsule Take 1 capsule (0.4 mg total) by mouth daily. 07/18/22   Frann Mabel Mt, DO  tiZANidine  (ZANAFLEX ) 4 MG tablet Take 1 tablet (4 mg total) by mouth every 6 (six) hours as needed for muscle spasms. 10/12/22   Frann Mabel Mt, DO      Allergies    Patient has no known allergies.    Review of Systems   Review of Systems  Physical Exam Updated Vital Signs BP (!) 159/92 (BP Location: Right Arm)   Pulse 75   Temp 97.8 F (36.6 C) (Oral)   Resp 18   Ht 5' 11 (1.803 m)   Wt 104.3 kg  SpO2 99%   BMI 32.08 kg/m  Physical Exam Vitals and nursing note reviewed.  Constitutional:      General: He is not in acute distress.    Appearance: He is well-developed. He is not ill-appearing.  HENT:     Head: Normocephalic and atraumatic.     Nose: Nose normal.     Mouth/Throat:     Mouth: Mucous membranes are moist.  Eyes:     Extraocular Movements: Extraocular movements intact.     Conjunctiva/sclera: Conjunctivae normal.     Pupils: Pupils are equal, round, and reactive to light.  Cardiovascular:     Rate and Rhythm: Normal rate and regular rhythm.     Pulses: Normal pulses.     Heart sounds: Normal heart sounds. No murmur heard. Pulmonary:     Effort: Pulmonary effort is normal. No respiratory  distress.     Breath sounds: Normal breath sounds.  Abdominal:     Palpations: Abdomen is soft.     Tenderness: There is no abdominal tenderness.  Musculoskeletal:        General: No swelling.     Cervical back: Normal range of motion and neck supple.  Skin:    General: Skin is warm and dry.     Capillary Refill: Capillary refill takes less than 2 seconds.  Neurological:     General: No focal deficit present.     Mental Status: He is alert.  Psychiatric:        Mood and Affect: Mood normal.     ED Results / Procedures / Treatments   Labs (all labs ordered are listed, but only abnormal results are displayed) Labs Reviewed  CBC WITH DIFFERENTIAL/PLATELET - Abnormal; Notable for the following components:      Result Value   Platelets 102 (*)    All other components within normal limits  COMPREHENSIVE METABOLIC PANEL - Abnormal; Notable for the following components:   Glucose, Bld 155 (*)    All other components within normal limits  RESP PANEL BY RT-PCR (RSV, FLU A&B, COVID)  [id number]    EKG EKG Interpretation Date/Time:  Monday January 21 2023 07:23:25 EST Ventricular Rate:  72 PR Interval:  176 QRS Duration:  95 QT Interval:  401 QTC Calculation: 439 R Axis:   42  Text Interpretation: Sinus rhythm Ventricular trigeminy LAE, consider biatrial enlargement Confirmed by Ruthe Cornet [phone]) on 01/21/2023 7:28:44 AM  Radiology No results found.  Procedures Procedures    Medications Ordered in ED Medications  ondansetron  (ZOFRAN -ODT) disintegrating tablet 4 mg (4 mg Oral Given 01/21/23 0635)  ondansetron  (ZOFRAN ) injection 4 mg (4 mg Intravenous Given 01/21/23 0732)    ED Course/ Medical Decision Making/ A&P                                 Medical Decision Making Risk Prescription drug management.   Vincent [name] is here with nausea vomiting diarrhea.  Overall unremarkable vitals.  History of CAD, diabetes.  Episode occurred prior to arrival.  Denies any sick  contacts.  Maybe suspicious food intake but not really aware of anything otherwise.  Denies any chest pain abdominal pain.  1 episode of loose stool nonbloody nonbilious.  1 episode of emesis.  Differential diagnosis likely viral process versus foodborne related illness.  Do not have any concern for ACS PE or intra-abdominal infection or process at this time including pancreatitis cholecystitis or appendicitis given history  and physical.  However we will check basic labs and give Zofran  check an EKG and reevaluate.  Abdominal exam is benign.  EKG shows sinus rhythm.  No ischemic changes.  No significant anemia electrolyte abnormality kidney injury or leukocytosis per my review and interpretation of labs.  On repeat exam he is tolerated some p.o.  He has no abdominal tenderness on repeat exam.  I do not think there is any other acute process going on and I suspect that this is a viral process.  Discharged in good condition.  Understands return precautions.  This chart was dictated using voice recognition software.  Despite best efforts to proofread,  errors can occur which can change the documentation meaning.         Final Clinical Impression(s) / ED Diagnoses Final diagnoses:  Nausea vomiting and diarrhea    Rx / DC Orders ED Discharge Orders          Ordered    ondansetron  (ZOFRAN -ODT) 4 MG disintegrating tablet  Every 8 hours PRN        01/21/23 0742              Ruthe Cornet, DO 01/21/23 0745

## 2023-01-22 ENCOUNTER — Telehealth: Payer: Self-pay | Admitting: *Deleted

## 2023-01-22 NOTE — Transitions of Care (Post Inpatient/ED Visit) (Signed)
   01/22/2023  Name: Vincent [name] MRN: [id number] DOB: Nov 10, 1955  Today's TOC FU Call Status: Today's TOC FU Call Status:: Unsuccessful Call (1st Attempt) Unsuccessful Call (1st Attempt) Date: 01/22/23  Attempted to reach the patient regarding the most recent Inpatient/ED visit.  Follow Up Plan: Additional outreach attempts will be made to reach the patient to complete the Transitions of Care (Post Inpatient/ED visit) call.   Signature Ordell Prichett, [facility]

## 2023-01-23 ENCOUNTER — Encounter: Payer: Self-pay | Admitting: Neurology

## 2023-01-24 ENCOUNTER — Telehealth: Payer: Self-pay | Admitting: *Deleted

## 2023-01-24 NOTE — Transitions of Care (Post Inpatient/ED Visit) (Signed)
   01/24/2023  Name: Vincent [name] MRN: [id number] DOB: December 04, 1955  Today's TOC FU Call Status: Today's TOC FU Call Status:: Unsuccessful Call (2nd Attempt) Unsuccessful Call (2nd Attempt) Date: 01/24/23  Attempted to reach the patient regarding the most recent Inpatient/ED visit.  Follow Up Plan: No further outreach attempts will be made at this time. We have been unable to contact the patient.  Signature Allecia Bells, [facility]

## 2023-01-29 NOTE — Progress Notes (Signed)
Dr. Lockie Mola saw this patient, I'm not the doctor

## 2023-01-30 ENCOUNTER — Telehealth: Payer: Self-pay

## 2023-01-30 ENCOUNTER — Other Ambulatory Visit: Payer: Self-pay

## 2023-01-30 ENCOUNTER — Ambulatory Visit: Payer: 59 | Admitting: Family Medicine

## 2023-01-30 ENCOUNTER — Other Ambulatory Visit (HOSPITAL_BASED_OUTPATIENT_CLINIC_OR_DEPARTMENT_OTHER): Payer: Self-pay

## 2023-01-30 ENCOUNTER — Other Ambulatory Visit: Payer: Self-pay | Admitting: Family Medicine

## 2023-01-30 DIAGNOSIS — I10 Essential (primary) hypertension: Secondary | ICD-10-CM

## 2023-01-30 MED ORDER — SILDENAFIL CITRATE 100 MG PO TABS
50.0000 mg | ORAL_TABLET | Freq: Every day | ORAL | 2 refills | Status: DC | PRN
  Filled 2023-01-30: qty 10, 10d supply, fill #0

## 2023-01-30 NOTE — Telephone Encounter (Signed)
Pt is also requesting a Rx for Viagra, pcp is aware and gave the okay to send in to pts pharmacy

## 2023-01-30 NOTE — Progress Notes (Signed)
Pt in office today for a pulse check per verbal order per PCP.    HR: 71 BP L arm: 152/82 R arm:150/78  Pt states he has not yet taken his blood pressure medication today. Pt advised per pcp to continue to take his BP medication.

## 2023-02-01 ENCOUNTER — Other Ambulatory Visit (HOSPITAL_BASED_OUTPATIENT_CLINIC_OR_DEPARTMENT_OTHER): Payer: Self-pay

## 2023-02-18 DIAGNOSIS — G4733 Obstructive sleep apnea (adult) (pediatric): Secondary | ICD-10-CM | POA: Diagnosis not present

## 2023-02-26 ENCOUNTER — Emergency Department (HOSPITAL_COMMUNITY): Payer: 59

## 2023-02-26 ENCOUNTER — Other Ambulatory Visit (HOSPITAL_BASED_OUTPATIENT_CLINIC_OR_DEPARTMENT_OTHER): Payer: Self-pay

## 2023-02-26 ENCOUNTER — Encounter: Payer: Self-pay | Admitting: Family Medicine

## 2023-02-26 ENCOUNTER — Encounter (HOSPITAL_COMMUNITY): Payer: Self-pay

## 2023-02-26 ENCOUNTER — Inpatient Hospital Stay (HOSPITAL_COMMUNITY)
Admission: EM | Admit: 2023-02-26 | Discharge: 2023-03-09 | DRG: 233 | Disposition: A | Discharge status: Home / Self Care | Payer: 59 | Attending: Thoracic Surgery (Cardiothoracic Vascular Surgery) | Admitting: Thoracic Surgery (Cardiothoracic Vascular Surgery)

## 2023-02-26 ENCOUNTER — Other Ambulatory Visit: Payer: Self-pay

## 2023-02-26 ENCOUNTER — Ambulatory Visit (INDEPENDENT_AMBULATORY_CARE_PROVIDER_SITE_OTHER): Payer: 59 | Admitting: Family Medicine

## 2023-02-26 VITALS — BP 126/78 | HR 95 | Temp 98.0°F | Resp 16 | Ht 71.0 in | Wt 219.8 lb

## 2023-02-26 DIAGNOSIS — I214 Non-ST elevation (NSTEMI) myocardial infarction: Principal | ICD-10-CM | POA: Diagnosis present

## 2023-02-26 DIAGNOSIS — I340 Nonrheumatic mitral (valve) insufficiency: Secondary | ICD-10-CM | POA: Diagnosis not present

## 2023-02-26 DIAGNOSIS — N179 Acute kidney failure, unspecified: Secondary | ICD-10-CM | POA: Diagnosis not present

## 2023-02-26 DIAGNOSIS — Z79899 Other long term (current) drug therapy: Secondary | ICD-10-CM | POA: Diagnosis not present

## 2023-02-26 DIAGNOSIS — R338 Other retention of urine: Secondary | ICD-10-CM | POA: Diagnosis not present

## 2023-02-26 DIAGNOSIS — I2510 Atherosclerotic heart disease of native coronary artery without angina pectoris: Secondary | ICD-10-CM | POA: Diagnosis present

## 2023-02-26 DIAGNOSIS — R918 Other nonspecific abnormal finding of lung field: Secondary | ICD-10-CM | POA: Diagnosis not present

## 2023-02-26 DIAGNOSIS — R079 Chest pain, unspecified: Secondary | ICD-10-CM

## 2023-02-26 DIAGNOSIS — E7800 Pure hypercholesterolemia, unspecified: Secondary | ICD-10-CM | POA: Diagnosis present

## 2023-02-26 DIAGNOSIS — I5023 Acute on chronic systolic (congestive) heart failure: Secondary | ICD-10-CM | POA: Diagnosis present

## 2023-02-26 DIAGNOSIS — Z7902 Long term (current) use of antithrombotics/antiplatelets: Secondary | ICD-10-CM

## 2023-02-26 DIAGNOSIS — D62 Acute posthemorrhagic anemia: Secondary | ICD-10-CM | POA: Diagnosis not present

## 2023-02-26 DIAGNOSIS — I080 Rheumatic disorders of both mitral and aortic valves: Secondary | ICD-10-CM | POA: Diagnosis not present

## 2023-02-26 DIAGNOSIS — R739 Hyperglycemia, unspecified: Secondary | ICD-10-CM

## 2023-02-26 DIAGNOSIS — G4733 Obstructive sleep apnea (adult) (pediatric): Secondary | ICD-10-CM | POA: Diagnosis present

## 2023-02-26 DIAGNOSIS — I493 Ventricular premature depolarization: Secondary | ICD-10-CM | POA: Diagnosis present

## 2023-02-26 DIAGNOSIS — Z9911 Dependence on respirator [ventilator] status: Secondary | ICD-10-CM

## 2023-02-26 DIAGNOSIS — E1165 Type 2 diabetes mellitus with hyperglycemia: Secondary | ICD-10-CM | POA: Diagnosis present

## 2023-02-26 DIAGNOSIS — J939 Pneumothorax, unspecified: Secondary | ICD-10-CM | POA: Diagnosis not present

## 2023-02-26 DIAGNOSIS — I25118 Atherosclerotic heart disease of native coronary artery with other forms of angina pectoris: Secondary | ICD-10-CM | POA: Diagnosis not present

## 2023-02-26 DIAGNOSIS — I252 Old myocardial infarction: Secondary | ICD-10-CM

## 2023-02-26 DIAGNOSIS — N401 Enlarged prostate with lower urinary tract symptoms: Secondary | ICD-10-CM | POA: Diagnosis present

## 2023-02-26 DIAGNOSIS — I471 Supraventricular tachycardia, unspecified: Secondary | ICD-10-CM | POA: Diagnosis not present

## 2023-02-26 DIAGNOSIS — R7303 Prediabetes: Secondary | ICD-10-CM | POA: Diagnosis not present

## 2023-02-26 DIAGNOSIS — Z7982 Long term (current) use of aspirin: Secondary | ICD-10-CM | POA: Diagnosis not present

## 2023-02-26 DIAGNOSIS — J9811 Atelectasis: Secondary | ICD-10-CM | POA: Diagnosis not present

## 2023-02-26 DIAGNOSIS — D696 Thrombocytopenia, unspecified: Secondary | ICD-10-CM

## 2023-02-26 DIAGNOSIS — I5021 Acute systolic (congestive) heart failure: Secondary | ICD-10-CM | POA: Diagnosis not present

## 2023-02-26 DIAGNOSIS — Z01810 Encounter for preprocedural cardiovascular examination: Secondary | ICD-10-CM | POA: Diagnosis not present

## 2023-02-26 DIAGNOSIS — J90 Pleural effusion, not elsewhere classified: Secondary | ICD-10-CM | POA: Diagnosis not present

## 2023-02-26 DIAGNOSIS — E119 Type 2 diabetes mellitus without complications: Secondary | ICD-10-CM | POA: Diagnosis not present

## 2023-02-26 DIAGNOSIS — Z452 Encounter for adjustment and management of vascular access device: Secondary | ICD-10-CM | POA: Diagnosis not present

## 2023-02-26 DIAGNOSIS — I10 Essential (primary) hypertension: Secondary | ICD-10-CM | POA: Diagnosis not present

## 2023-02-26 DIAGNOSIS — Z833 Family history of diabetes mellitus: Secondary | ICD-10-CM

## 2023-02-26 DIAGNOSIS — K5900 Constipation, unspecified: Secondary | ICD-10-CM | POA: Diagnosis not present

## 2023-02-26 DIAGNOSIS — I110 Hypertensive heart disease with heart failure: Secondary | ICD-10-CM | POA: Diagnosis present

## 2023-02-26 DIAGNOSIS — D689 Coagulation defect, unspecified: Secondary | ICD-10-CM | POA: Diagnosis not present

## 2023-02-26 DIAGNOSIS — Z951 Presence of aortocoronary bypass graft: Secondary | ICD-10-CM | POA: Diagnosis not present

## 2023-02-26 DIAGNOSIS — I25110 Atherosclerotic heart disease of native coronary artery with unstable angina pectoris: Secondary | ICD-10-CM | POA: Diagnosis not present

## 2023-02-26 DIAGNOSIS — Z9889 Other specified postprocedural states: Secondary | ICD-10-CM | POA: Diagnosis not present

## 2023-02-26 DIAGNOSIS — Z48812 Encounter for surgical aftercare following surgery on the circulatory system: Secondary | ICD-10-CM | POA: Diagnosis not present

## 2023-02-26 DIAGNOSIS — R9431 Abnormal electrocardiogram [ECG] [EKG]: Secondary | ICD-10-CM | POA: Diagnosis not present

## 2023-02-26 DIAGNOSIS — Z8249 Family history of ischemic heart disease and other diseases of the circulatory system: Secondary | ICD-10-CM

## 2023-02-26 DIAGNOSIS — E785 Hyperlipidemia, unspecified: Secondary | ICD-10-CM | POA: Diagnosis not present

## 2023-02-26 HISTORY — DX: Non-ST elevation (NSTEMI) myocardial infarction: I21.4

## 2023-02-26 LAB — CBC
HCT: 42.6 % (ref 39.0–52.0)
Hemoglobin: 14 g/dL (ref 13.0–17.0)
MCH: 27.9 pg (ref 26.0–34.0)
MCHC: 32.9 g/dL (ref 30.0–36.0)
MCV: 84.9 fL (ref 80.0–100.0)
Platelets: 170 10*3/uL (ref 150–400)
RBC: 5.02 MIL/uL (ref 4.22–5.81)
RDW: 13.4 % (ref 11.5–15.5)
WBC: 5.5 10*3/uL (ref 4.0–10.5)
nRBC: 0 % (ref 0.0–0.2)

## 2023-02-26 LAB — BASIC METABOLIC PANEL
Anion gap: 15 (ref 5–15)
BUN: 11 mg/dL (ref 8–23)
CO2: 25 mmol/L (ref 22–32)
Calcium: 9.3 mg/dL (ref 8.9–10.3)
Chloride: 103 mmol/L (ref 98–111)
Creatinine, Ser: 0.96 mg/dL (ref 0.61–1.24)
GFR, Estimated: >60 mL/min (ref >60)
Glucose, Bld: 89 mg/dL (ref 70–99)
Potassium: 3.4 mmol/L — ABNORMAL LOW (ref 3.5–5.1)
Sodium: 143 mmol/L (ref 135–145)

## 2023-02-26 LAB — TROPONIN I (HIGH SENSITIVITY)
Troponin I (High Sensitivity): 374 ng/L (ref <18)
Troponin I (High Sensitivity): 430 ng/L (ref <18)

## 2023-02-26 MED ORDER — ACETAMINOPHEN 325 MG PO TABS
650.0000 mg | ORAL_TABLET | ORAL | Status: DC | PRN
  Administered 2023-03-02: 650 mg via ORAL
  Filled 2023-02-26: qty 2

## 2023-02-26 MED ORDER — NITROGLYCERIN 0.4 MG SL SUBL
0.4000 mg | SUBLINGUAL_TABLET | SUBLINGUAL | 3 refills | Status: DC | PRN
  Filled 2023-02-26: qty 25, 5d supply, fill #0

## 2023-02-26 MED ORDER — HEPARIN (PORCINE) 25000 UT/250ML-% IV SOLN
1200.0000 [IU]/h | INTRAVENOUS | Status: DC
  Administered 2023-02-26: 1200 [IU]/h via INTRAVENOUS
  Filled 2023-02-26: qty 250

## 2023-02-26 MED ORDER — ASPIRIN 81 MG PO TBEC: 81.0000 mg | DELAYED_RELEASE_TABLET | Freq: Every day | ORAL | Status: DC

## 2023-02-26 MED ORDER — HEPARIN BOLUS VIA INFUSION
4000.0000 [IU] | Freq: Once | INTRAVENOUS | Status: AC
  Administered 2023-02-26: 4000 [IU] via INTRAVENOUS
  Filled 2023-02-26: qty 4000

## 2023-02-26 MED ORDER — ONDANSETRON HCL 4 MG/2ML IJ SOLN: 4.0000 mg | Freq: Four times a day (QID) | INTRAMUSCULAR | Status: DC | PRN

## 2023-02-26 MED ORDER — ASPIRIN 81 MG PO CHEW
324.0000 mg | CHEWABLE_TABLET | Freq: Once | ORAL | Status: AC
  Administered 2023-02-26: 324 mg via ORAL
  Filled 2023-02-26: qty 4

## 2023-02-26 MED ORDER — NITROGLYCERIN 0.4 MG SL SUBL: 0.4000 mg | SUBLINGUAL_TABLET | SUBLINGUAL | Status: DC | PRN
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
## 2023-02-26 NOTE — ED Notes (Addendum)
 Dr. Rubin Payor (EDP) and charge nurse notified on patient's elevated Troponin result .
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
## 2023-02-26 NOTE — ED Provider Triage Note (Signed)
 Emergency Medicine Provider Triage Evaluation Note  Vincent [name] , a 68 y.o. male  was evaluated in triage.  Pt complains of abnormal EKG. was being evaluated earlier today by his PCP for chest pain.  He states has been on and off for the past 3 to 4 weeks and seems to be worse when he is doing something exertional.  Also has been having intermittent shortness of breath as well.  States that this time he neither is short of breath or having chest pain.  Was sent here primarily because of the EKG findings at the clinic.  Review of Systems  Positive: See above Negative: See above  Physical Exam  BP 114/61   Pulse 81   Temp 98 F (36.7 C) (Oral)   Resp 16   Ht 5\' 11"  (1.803 m)   Wt 99.7 kg   SpO2 98%   BMI 30.66 kg/m  Gen:   Awake, no distress   Resp:  Normal effort  MSK:   Moves extremities without difficulty  Other:    Medical Decision Making  Medically screening exam initiated at 6:40 PM.  Appropriate orders placed.  Vincent [name] was informed that the remainder of the evaluation will be completed by another provider, this initial triage assessment does not replace that evaluation, and the importance of remaining in the ED until their evaluation is complete.  Work up started   Vincent [name], [place] 02/26/23 [phone]

## 2023-02-26 NOTE — ED Notes (Signed)
 Pt ambulated to RM 27 from [facility] lobby with steady gait, NAD noted, A&O x4

## 2023-02-26 NOTE — ED Notes (Signed)
 Extra lab tubes obtained and sent to lab during IV start for additional labs that may need to be added on at a later time
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
## 2023-02-26 NOTE — ED Triage Notes (Signed)
 Pt sent by PCP for abnormal EKG. Pt denies chest pain or SOB

## 2023-02-26 NOTE — Patient Instructions (Signed)
 If you do not hear anything about your referral in the next 1-2 weeks, call our office and ask for an update.  Use the nitro as needed. If it helps for short periods of time, but pain returns, seek immediate care.  Pepcid/famotidine 20 mg 1-2 times daily can help with reflux symptoms.   The only lifestyle changes that have data behind them are weight loss for the overweight/obese and elevating the head of the bed. Finding out which foods/positions are triggers is important.  Let us know if you need anything.
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
## 2023-02-27 ENCOUNTER — Encounter (HOSPITAL_COMMUNITY): Payer: Self-pay | Admitting: Cardiology

## 2023-02-27 ENCOUNTER — Inpatient Hospital Stay (HOSPITAL_COMMUNITY): Payer: 59

## 2023-02-27 ENCOUNTER — Inpatient Hospital Stay (HOSPITAL_COMMUNITY)
Admission: EM | Disposition: A | Discharge status: Home / Self Care | Payer: Self-pay | Source: Home / Self Care | Attending: Thoracic Surgery (Cardiothoracic Vascular Surgery)

## 2023-02-27 DIAGNOSIS — I214 Non-ST elevation (NSTEMI) myocardial infarction: Secondary | ICD-10-CM

## 2023-02-27 DIAGNOSIS — I10 Essential (primary) hypertension: Secondary | ICD-10-CM | POA: Diagnosis not present

## 2023-02-27 DIAGNOSIS — E7800 Pure hypercholesterolemia, unspecified: Secondary | ICD-10-CM

## 2023-02-27 DIAGNOSIS — R7303 Prediabetes: Secondary | ICD-10-CM

## 2023-02-27 DIAGNOSIS — I25110 Atherosclerotic heart disease of native coronary artery with unstable angina pectoris: Secondary | ICD-10-CM | POA: Diagnosis not present

## 2023-02-27 DIAGNOSIS — I2510 Atherosclerotic heart disease of native coronary artery without angina pectoris: Secondary | ICD-10-CM

## 2023-02-27 HISTORY — PX: LEFT HEART CATH AND CORONARY ANGIOGRAPHY: CATH118249

## 2023-02-27 LAB — CBC
HCT: 39.6 % (ref 39.0–52.0)
Hemoglobin: 12.7 g/dL — ABNORMAL LOW (ref 13.0–17.0)
MCH: 27.4 pg (ref 26.0–34.0)
MCHC: 32.1 g/dL (ref 30.0–36.0)
MCV: 85.5 fL (ref 80.0–100.0)
Platelets: 143 10*3/uL — ABNORMAL LOW (ref 150–400)
RBC: 4.63 MIL/uL (ref 4.22–5.81)
RDW: 13.7 % (ref 11.5–15.5)
WBC: 3.7 10*3/uL — ABNORMAL LOW (ref 4.0–10.5)
nRBC: 0 % (ref 0.0–0.2)

## 2023-02-27 LAB — COMPREHENSIVE METABOLIC PANEL
ALT: 20 U/L (ref 0–44)
AST: 23 U/L (ref 15–41)
Albumin: 3.3 g/dL — ABNORMAL LOW (ref 3.5–5.0)
Alkaline Phosphatase: 46 U/L (ref 38–126)
Anion gap: 8 (ref 5–15)
BUN: 13 mg/dL (ref 8–23)
CO2: 28 mmol/L (ref 22–32)
Calcium: 8.9 mg/dL (ref 8.9–10.3)
Chloride: 105 mmol/L (ref 98–111)
Creatinine, Ser: 1.33 mg/dL — ABNORMAL HIGH (ref 0.61–1.24)
GFR, Estimated: 59 mL/min — ABNORMAL LOW (ref >60)
Glucose, Bld: 150 mg/dL — ABNORMAL HIGH (ref 70–99)
Potassium: 3.3 mmol/L — ABNORMAL LOW (ref 3.5–5.1)
Sodium: 141 mmol/L (ref 135–145)
Total Bilirubin: 0.7 mg/dL (ref 0.0–1.2)
Total Protein: 6.2 g/dL — ABNORMAL LOW (ref 6.5–8.1)

## 2023-02-27 LAB — ECHOCARDIOGRAM COMPLETE
AR max vel: 2.09 cm2
AV Area VTI: 2.14 cm2
AV Area mean vel: 2.19 cm2
AV Mean grad: 4 mm[Hg]
AV Peak grad: 7.5 mm[Hg]
Ao pk vel: 1.37 m/s
Area-P 1/2: 3.4 cm2
Height: 71 in
S' Lateral: 3.9 cm
Weight: 3516.78 [oz_av]

## 2023-02-27 LAB — LIPID PANEL
Cholesterol: 123 mg/dL (ref 0–200)
HDL: 47 mg/dL (ref >40)
LDL Cholesterol: 66 mg/dL (ref 0–99)
Total CHOL/HDL Ratio: 2.6 {ratio}
Triglycerides: 49 mg/dL (ref <150)
VLDL: 10 mg/dL (ref 0–40)

## 2023-02-27 LAB — HEPARIN LEVEL (UNFRACTIONATED): Heparin Unfractionated: 0.32 [IU]/mL (ref 0.30–0.70)

## 2023-02-27 LAB — HEMOGLOBIN A1C
Hgb A1c MFr Bld: 6.3 % — ABNORMAL HIGH (ref 4.8–5.6)
Mean Plasma Glucose: 134.11 mg/dL

## 2023-02-27 LAB — TSH: TSH: 1.323 u[IU]/mL (ref 0.350–4.500)

## 2023-02-27 SURGERY — LEFT HEART CATH AND CORONARY ANGIOGRAPHY
Anesthesia: LOCAL

## 2023-02-27 MED ORDER — LIDOCAINE HCL (PF) 1 % IJ SOLN
INTRAMUSCULAR | Status: DC | PRN
  Administered 2023-02-27: 5 mL

## 2023-02-27 MED ORDER — MIDAZOLAM HCL 2 MG/2ML IJ SOLN
INTRAMUSCULAR | Status: DC | PRN
  Administered 2023-02-27: 2 mg via INTRAVENOUS

## 2023-02-27 MED ORDER — FENTANYL CITRATE (PF) 100 MCG/2ML IJ SOLN
INTRAMUSCULAR | Status: AC
Start: 2023-02-27 — End: ?
  Filled 2023-02-27: qty 2

## 2023-02-27 MED ORDER — HYDRALAZINE HCL 20 MG/ML IJ SOLN: 10.0000 mg | INTRAMUSCULAR | Status: AC | PRN

## 2023-02-27 MED ORDER — AMLODIPINE BESYLATE 10 MG PO TABS
10.0000 mg | ORAL_TABLET | Freq: Every day | ORAL | Status: DC
  Administered 2023-02-27 – 2023-03-03 (×5): 10 mg via ORAL
  Filled 2023-02-27: qty 2
  Filled 2023-02-27 (×4): qty 1

## 2023-02-27 MED ORDER — LABETALOL HCL 5 MG/ML IV SOLN: 10.0000 mg | INTRAVENOUS | Status: AC | PRN

## 2023-02-27 MED ORDER — SODIUM CHLORIDE 0.9 % WEIGHT BASED INFUSION: 1.0000 mL/kg/h | INTRAVENOUS | Status: DC

## 2023-02-27 MED ORDER — SODIUM CHLORIDE 0.9 % IV SOLN
INTRAVENOUS | Status: AC | PRN
  Administered 2023-02-27: 50 mL/h via INTRAVENOUS

## 2023-02-27 MED ORDER — POTASSIUM CHLORIDE CRYS ER 20 MEQ PO TBCR
40.0000 meq | EXTENDED_RELEASE_TABLET | Freq: Once | ORAL | Status: AC
  Administered 2023-02-27: 40 meq via ORAL
  Filled 2023-02-27: qty 2

## 2023-02-27 MED ORDER — MIDAZOLAM HCL 2 MG/2ML IJ SOLN
INTRAMUSCULAR | Status: AC
  Filled 2023-02-27: qty 2

## 2023-02-27 MED ORDER — ATORVASTATIN CALCIUM 80 MG PO TABS
80.0000 mg | ORAL_TABLET | Freq: Every day | ORAL | Status: DC
Start: 2023-02-27 — End: 2023-02-27

## 2023-02-27 MED ORDER — TAMSULOSIN HCL 0.4 MG PO CAPS
0.4000 mg | ORAL_CAPSULE | Freq: Every day | ORAL | Status: DC
  Administered 2023-02-27 – 2023-03-03 (×5): 0.4 mg via ORAL
  Filled 2023-02-27 (×5): qty 1

## 2023-02-27 MED ORDER — SODIUM CHLORIDE 0.9 % IV SOLN: INTRAVENOUS | Status: AC

## 2023-02-27 MED ORDER — VERAPAMIL HCL 2.5 MG/ML IV SOLN
INTRAVENOUS | Status: DC | PRN
  Administered 2023-02-27: 10 mL via INTRA_ARTERIAL

## 2023-02-27 MED ORDER — VERAPAMIL HCL 2.5 MG/ML IV SOLN
INTRAVENOUS | Status: AC
  Filled 2023-02-27: qty 2

## 2023-02-27 MED ORDER — SODIUM CHLORIDE 0.9% FLUSH
3.0000 mL | Freq: Two times a day (BID) | INTRAVENOUS | Status: DC
  Administered 2023-02-27 – 2023-03-03 (×5): 3 mL via INTRAVENOUS

## 2023-02-27 MED ORDER — HEPARIN SODIUM (PORCINE) 1000 UNIT/ML IJ SOLN
INTRAMUSCULAR | Status: AC
  Filled 2023-02-27: qty 10

## 2023-02-27 MED ORDER — ATORVASTATIN CALCIUM 80 MG PO TABS
80.0000 mg | ORAL_TABLET | Freq: Every evening | ORAL | Status: DC
  Administered 2023-02-27 – 2023-03-08 (×10): 80 mg via ORAL
  Filled 2023-02-27 (×10): qty 1

## 2023-02-27 MED ORDER — HEPARIN (PORCINE) IN NACL 1000-0.9 UT/500ML-% IV SOLN
INTRAVENOUS | Status: DC | PRN
  Administered 2023-02-27 (×2): 500 mL

## 2023-02-27 MED ORDER — IOHEXOL 350 MG/ML SOLN
INTRAVENOUS | Status: DC | PRN
  Administered 2023-02-27: 70 mL

## 2023-02-27 MED ORDER — SODIUM CHLORIDE 0.9 % IV SOLN
250.0000 mL | INTRAVENOUS | Status: AC | PRN
Start: 2023-02-27 — End: 2023-02-28

## 2023-02-27 MED ORDER — SODIUM CHLORIDE 0.9 % WEIGHT BASED INFUSION: 3.0000 mL/kg/h | INTRAVENOUS | Status: DC

## 2023-02-27 MED ORDER — SODIUM CHLORIDE 0.9% FLUSH: 3.0000 mL | INTRAVENOUS | Status: DC | PRN

## 2023-02-27 MED ORDER — ASPIRIN 81 MG PO CHEW
81.0000 mg | CHEWABLE_TABLET | ORAL | Status: AC
  Administered 2023-02-27: 81 mg via ORAL
  Filled 2023-02-27: qty 1

## 2023-02-27 MED ORDER — FENTANYL CITRATE (PF) 100 MCG/2ML IJ SOLN
INTRAMUSCULAR | Status: DC | PRN
  Administered 2023-02-27: 25 ug via INTRAVENOUS

## 2023-02-27 MED ORDER — LIDOCAINE HCL (PF) 1 % IJ SOLN
INTRAMUSCULAR | Status: AC
  Filled 2023-02-27: qty 30

## 2023-02-27 MED ORDER — HEPARIN (PORCINE) 25000 UT/250ML-% IV SOLN
1650.0000 [IU]/h | INTRAVENOUS | Status: DC
  Administered 2023-02-27 (×2): 1200 [IU]/h via INTRAVENOUS
  Administered 2023-02-28 – 2023-03-01 (×2): 1400 [IU]/h via INTRAVENOUS
  Administered 2023-03-02: 1600 [IU]/h via INTRAVENOUS
  Administered 2023-03-03: 1650 [IU]/h via INTRAVENOUS
  Administered 2023-03-03: 1600 [IU]/h via INTRAVENOUS
  Filled 2023-02-27 (×6): qty 250

## 2023-02-27 MED ORDER — ASPIRIN 81 MG PO CHEW
81.0000 mg | CHEWABLE_TABLET | Freq: Every day | ORAL | Status: DC
  Administered 2023-02-28 – 2023-03-03 (×4): 81 mg via ORAL
  Filled 2023-02-27 (×4): qty 1

## 2023-02-27 SURGICAL SUPPLY — 9 items
CATH 5FR JL3.5 JR4 ANG PIG MP (CATHETERS) IMPLANT
CATH INFINITI 5FR AL1 (CATHETERS) IMPLANT
DEVICE RAD COMP TR BAND LRG (VASCULAR PRODUCTS) IMPLANT
GLIDESHEATH SLEND SS 6F .021 (SHEATH) IMPLANT
GUIDEWIRE INQWIRE 1.5J.035X260 (WIRE) IMPLANT
INQWIRE 1.5J .035X260CM (WIRE) ×1 IMPLANT
KIT SYRINGE INJ CVI SPIKEX1 (MISCELLANEOUS) IMPLANT
PACK CARDIAC CATHETERIZATION (CUSTOM PROCEDURE TRAY) ×1 IMPLANT
SET ATX-X65L (MISCELLANEOUS) IMPLANT
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
## 2023-02-27 NOTE — Progress Notes (Signed)
 PHARMACY - ANTICOAGULATION CONSULT NOTE  Pharmacy Consult for heparin Indication:  NSTEMI  Labs: Recent Labs    02/26/23 1837 02/26/23 2051 02/27/23 0517  HGB 14.0  --  12.7*  HCT 42.6  --  39.6  PLT 170  --  143*  HEPARINUNFRC  --   --  0.32  CREATININE 0.96  --   --   TROPONINIHS 374* 430*  --    Assessment/Plan:  68yo male therapeutic on heparin with initial dosing for NSTEMI. Will continue infusion at current rate of 1200 units/hr and confirm stable with additional level.  Vernard Gambles, PharmD, BCPS 02/27/2023 5:49 AM

## 2023-02-27 NOTE — ED Notes (Signed)
Patient leaving the floor in stable condition, AOX4, with his belongings, family, and staff.

## 2023-02-27 NOTE — Plan of Care (Signed)

## 2023-02-27 NOTE — Interval H&P Note (Signed)
 History and Physical Interval Note:  02/27/2023 12:37 PM  Vincent [name]  has presented today for surgery, with the diagnosis of nstemi.  The various methods of treatment have been discussed with the patient and family. After consideration of risks, benefits and other options for treatment, the patient has consented to  Procedure(s): LEFT HEART CATH AND CORONARY ANGIOGRAPHY (N/A) as a surgical intervention.  The patient's history has been reviewed, patient examined, no change in status, stable for surgery.  I have reviewed the patient's chart and labs.  Questions were answered to the patient's satisfaction.     Tonny Bollman

## 2023-02-27 NOTE — Progress Notes (Signed)
 Rounding Note    Patient Name: Vincent [name] Date of Encounter: 02/27/2023  [facility] Cardiologist: Norman Herrlich MD   Subjective   Patient feels well this am. No chest pain or dyspnea. Reports 4 week history of SSCP with dyspnea typically with exertion and resolves with rest. Hasn't used Ntg. Symptoms worse in the past 2 days.  Inpatient Medications    Scheduled Meds:  amLODipine  10 mg Oral Daily   aspirin  81 mg Oral Pre-Cath   aspirin EC  81 mg Oral Daily   potassium chloride  40 mEq Oral Once   Continuous Infusions:  sodium chloride     Followed by   sodium chloride     heparin 1,200 Units/hr (02/26/23 2131)   PRN Meds: acetaminophen, nitroGLYCERIN, ondansetron (ZOFRAN) IV   Vital Signs    Vitals:   02/27/23 0400 02/27/23 0500 02/27/23 0600 02/27/23 0700  BP: 129/74 122/66 (!) 142/79 (!) 140/93  Pulse: 61 (!) 58 65 69  Resp: 17 16 16 19   Temp:      TempSrc:      SpO2: 100% 100% 99% 100%  Weight:      Height:        Intake/Output Summary (Last 24 hours) at 02/27/2023 0759 Last data filed at 02/26/2023 2302 Gross per 24 hour  Intake --  Output 150 ml  Net -150 ml      02/26/2023    6:29 PM 02/26/2023    1:12 PM 01/21/2023    6:28 AM  Last 3 Weights  Weight (lbs) 219 lb 12.8 oz 219 lb 12.8 oz 230 lb  Weight (kg) 99.7 kg 99.701 kg 104.327 kg      Telemetry    NSR with frequent PVCs, couplets, bigeminy - Personally Reviewed  ECG    NSR with PVCs. New inferolateral T wave inversion - Personally Reviewed  Physical Exam   GEN: No acute distress.   Neck: No JVD Cardiac: RRR, no murmurs, rubs, or gallops.  Respiratory: Clear to auscultation bilaterally. GI: Soft, nontender, non-distended  MS: No edema; No deformity. Neuro:  Nonfocal  Psych: Normal affect   Labs    High Sensitivity Troponin:   Recent Labs  Lab 02/26/23 1837 02/26/23 2051  TROPONINIHS 374* 430*     Chemistry Recent Labs  Lab 02/26/23 1837 02/27/23 0517   NA 143 141  K 3.4* 3.3*  CL 103 105  CO2 25 28  GLUCOSE 89 150*  BUN 11 13  CREATININE 0.96 1.33*  CALCIUM 9.3 8.9  PROT  --  6.2*  ALBUMIN  --  3.3*  AST  --  23  ALT  --  20  ALKPHOS  --  46  BILITOT  --  0.7  GFRNONAA >60 59*  ANIONGAP 15 8    Lipids No results for input(s): "CHOL", "TRIG", "HDL", "LABVLDL", "LDLCALC", "CHOLHDL" in the last 168 hours.  Hematology Recent Labs  Lab 02/26/23 1837 02/27/23 0517  WBC 5.5 3.7*  RBC 5.02 4.63  HGB 14.0 12.7*  HCT 42.6 39.6  MCV 84.9 85.5  MCH 27.9 27.4  MCHC 32.9 32.1  RDW 13.4 13.7  PLT 170 143*   Thyroid  Recent Labs  Lab 02/27/23 0517  TSH 1.323    BNPNo results for input(s): "BNP", "PROBNP" in the last 168 hours.  DDimer No results for input(s): "DDIMER" in the last 168 hours.   Radiology    DG Chest 2 View Result Date: 02/26/2023 CLINICAL DATA:  Abnormal  EKG EXAM: CHEST - 2 VIEW COMPARISON:  11/25/2016 FINDINGS: The heart size and mediastinal contours are within normal limits. Both lungs are clear. The visualized skeletal structures are unremarkable. IMPRESSION: No active cardiopulmonary disease. Electronically Signed   By: Jasmine Pang M.D.   On: 02/26/2023 19:49    Cardiac Studies   TTE 11/26/2016   Left ventricle:  The cavity size was normal. Systolic function was  normal. The estimated ejection fraction was in the range of 55% to  60%.  Regional wall motion abnormalities:  Probable mild  hypokinesis of the apical lateral myocardium; consistent with  ischemia in the distribution of ramus intermedius or diagonal  coronary artery. Doppler parameters are consistent with abnormal  left ventricular relaxation (grade 1 diastolic dysfunction).  Indeterminate ventricular filling pressure by Doppler parameters.       LHC 11/25/2016   MPRESSION:[name] has noncritical disease in his RCA and LAD. He does have a high-grade lesion in a relatively small to medium sized diagonal branch in the proximal to  midportion. The ostium and proximal portion had diffuse 60% stenosis that was approximately 1.75 mm. The distal diagonal branch. Stenosis was about 2-2.25 mm. The patient was pain-free at the end of the case. His LV function was normal. At this point I recommend medical therapy with 24 hours of heparin and antiplatelet therapy. She'll return chest pain attempt could be made to perform PTA plus or minus stenting. It should be noted that the patient did have a moderate ostial left main stenosis in the 40%. The sheath was removed and a TR band was placed on the right wrist which is patent hemostasis. The patient left the lab in stable condition. He will be treated with routine post-MI pharmacology including hypo-to statin drug, beta-blockade and ACE inhibition in addition to dual antibiotic therapy. He left the lab in stable condition.   Patient Profile     68 y.o. male with known CAD presents with progressive angina and NSTEMI. History of HTN, HLD, prediabetes.   Assessment & Plan    NSTEMI. Troponin elevated to 430. Classic history. Ecg with new T wave inversion in inferolateral leads. Known CAD from cath in 2018. I reviewed prior study. Had severe disease in diagonal with moderate diffuse disease in the proximal LAD and RCA. Now pain free on ASA, heparin, amlodipine. Doubt he would tolerate beta blocker with low resting HR. Plan cardiac cath today to assess CAD and determine optimal treatment. Echo pending. The procedure and risks were reviewed including but not limited to death, myocardial infarction, stroke, arrythmias, bleeding, transfusion, emergency surgery, dye allergy, or renal dysfunction. The patient voices understanding and is agreeable to proceed. HTN. On ARB/ HCT/amlodipine at home. Will hold HCT and ARB for now with creatinine up a little.  Hypercholesterolemia. On high dose statin but still LDL 130 range. Will need lipid referral as outpatient to consider PCSK 9 inhibitor.  Prediabetes.       For questions or updates, please contact [facility] Please consult [email or web address] for contact info under        Signed, Bellarose Burtt [place], MD  02/27/2023, 7:59 AM

## 2023-02-27 NOTE — H&P (View-Only) (Signed)
 Rounding Note    Patient Name: Vincent [name] Date of Encounter: 02/27/2023  [facility] Cardiologist: Vincent Herrlich MD   Subjective   Patient feels well this am. No chest pain or dyspnea. Reports 4 week history of SSCP with dyspnea typically with exertion and resolves with rest. Hasn't used Ntg. Symptoms worse in the past 2 days.  Inpatient Medications    Scheduled Meds:  amLODipine  10 mg Oral Daily   aspirin  81 mg Oral Pre-Cath   aspirin EC  81 mg Oral Daily   potassium chloride  40 mEq Oral Once   Continuous Infusions:  sodium chloride     Followed by   sodium chloride     heparin 1,200 Units/hr (02/26/23 2131)   PRN Meds: acetaminophen, nitroGLYCERIN, ondansetron (ZOFRAN) IV   Vital Signs    Vitals:   02/27/23 0400 02/27/23 0500 02/27/23 0600 02/27/23 0700  BP: 129/74 122/66 (!) 142/79 (!) 140/93  Pulse: 61 (!) 58 65 69  Resp: 17 16 16 19   Temp:      TempSrc:      SpO2: 100% 100% 99% 100%  Weight:      Height:        Intake/Output Summary (Last 24 hours) at 02/27/2023 0759 Last data filed at 02/26/2023 2302 Gross per 24 hour  Intake --  Output 150 ml  Net -150 ml      02/26/2023    6:29 PM 02/26/2023    1:12 PM 01/21/2023    6:28 AM  Last 3 Weights  Weight (lbs) 219 lb 12.8 oz 219 lb 12.8 oz 230 lb  Weight (kg) 99.7 kg 99.701 kg 104.327 kg      Telemetry    NSR with frequent PVCs, couplets, bigeminy - Personally Reviewed  ECG    NSR with PVCs. New inferolateral T wave inversion - Personally Reviewed  Physical Exam   GEN: No acute distress.   Neck: No JVD Cardiac: RRR, no murmurs, rubs, or gallops.  Respiratory: Clear to auscultation bilaterally. GI: Soft, nontender, non-distended  MS: No edema; No deformity. Neuro:  Nonfocal  Psych: Normal affect   Labs    High Sensitivity Troponin:   Recent Labs  Lab 02/26/23 1837 02/26/23 2051  TROPONINIHS 374* 430*     Chemistry Recent Labs  Lab 02/26/23 1837 02/27/23 0517   NA 143 141  K 3.4* 3.3*  CL 103 105  CO2 25 28  GLUCOSE 89 150*  BUN 11 13  CREATININE 0.96 1.33*  CALCIUM 9.3 8.9  PROT  --  6.2*  ALBUMIN  --  3.3*  AST  --  23  ALT  --  20  ALKPHOS  --  46  BILITOT  --  0.7  GFRNONAA >60 59*  ANIONGAP 15 8    Lipids No results for input(s): "CHOL", "TRIG", "HDL", "LABVLDL", "LDLCALC", "CHOLHDL" in the last 168 hours.  Hematology Recent Labs  Lab 02/26/23 1837 02/27/23 0517  WBC 5.5 3.7*  RBC 5.02 4.63  HGB 14.0 12.7*  HCT 42.6 39.6  MCV 84.9 85.5  MCH 27.9 27.4  MCHC 32.9 32.1  RDW 13.4 13.7  PLT 170 143*   Thyroid  Recent Labs  Lab 02/27/23 0517  TSH 1.323    BNPNo results for input(s): "BNP", "PROBNP" in the last 168 hours.  DDimer No results for input(s): "DDIMER" in the last 168 hours.   Radiology    DG Chest 2 View Result Date: 02/26/2023 CLINICAL DATA:  Abnormal  EKG EXAM: CHEST - 2 VIEW COMPARISON:  11/25/2016 FINDINGS: The heart size and mediastinal contours are within normal limits. Both lungs are clear. The visualized skeletal structures are unremarkable. IMPRESSION: No active cardiopulmonary disease. Electronically Signed   By: Vincent [name] M.D.   On: 02/26/2023 19:49    Cardiac Studies   TTE 11/26/2016   Left ventricle:  The cavity size was normal. Systolic function was  normal. The estimated ejection fraction was in the range of 55% to  60%.  Regional wall motion abnormalities:  Probable mild  hypokinesis of the apical lateral myocardium; consistent with  ischemia in the distribution of ramus intermedius or diagonal  coronary artery. Doppler parameters are consistent with abnormal  left ventricular relaxation (grade 1 diastolic dysfunction).  Indeterminate ventricular filling pressure by Doppler parameters.       LHC 11/25/2016   MPRESSION:Vincent [name] has noncritical disease in his RCA and LAD. He does have a high-grade lesion in a relatively small to medium sized diagonal branch in the proximal to  midportion. The ostium and proximal portion had diffuse 60% stenosis that was approximately 1.75 mm. The distal diagonal branch. Stenosis was about 2-2.25 mm. The patient was pain-free at the end of the case. His LV function was normal. At this point I recommend medical therapy with 24 hours of heparin and antiplatelet therapy. She'll return chest pain attempt could be made to perform PTA plus or minus stenting. It should be noted that the patient did have a moderate ostial left main stenosis in the 40%. The sheath was removed and a TR band was placed on the right wrist which is patent hemostasis. The patient left the lab in stable condition. He will be treated with routine post-MI pharmacology including hypo-to statin drug, beta-blockade and ACE inhibition in addition to dual antibiotic therapy. He left the lab in stable condition.   Patient Profile     68 y.o. male with known CAD presents with progressive angina and NSTEMI. History of HTN, HLD, prediabetes.   Assessment & Plan    NSTEMI. Troponin elevated to 430. Classic history. Ecg with new T wave inversion in inferolateral leads. Known CAD from cath in 2018. I reviewed prior study. Had severe disease in diagonal with moderate diffuse disease in the proximal LAD and RCA. Now pain free on ASA, heparin, amlodipine. Doubt he would tolerate beta blocker with low resting HR. Plan cardiac cath today to assess CAD and determine optimal treatment. Echo pending. The procedure and risks were reviewed including but not limited to death, myocardial infarction, stroke, arrythmias, bleeding, transfusion, emergency surgery, dye allergy, or renal dysfunction. The patient voices understanding and is agreeable to proceed. HTN. On ARB/ HCT/amlodipine at home. Will hold HCT and ARB for now with creatinine up a little.  Hypercholesterolemia. On high dose statin but still LDL 130 range. Will need lipid referral as outpatient to consider PCSK 9 inhibitor.  Prediabetes.       For questions or updates, please contact [facility] Please consult [email or web address] for contact info under        Signed, Vincent Burtt [place], MD  02/27/2023, 7:59 AM
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
## 2023-02-28 ENCOUNTER — Inpatient Hospital Stay (HOSPITAL_COMMUNITY): Payer: 59

## 2023-02-28 DIAGNOSIS — I214 Non-ST elevation (NSTEMI) myocardial infarction: Secondary | ICD-10-CM | POA: Diagnosis not present

## 2023-02-28 DIAGNOSIS — Z01810 Encounter for preprocedural cardiovascular examination: Secondary | ICD-10-CM

## 2023-02-28 LAB — HEPARIN LEVEL (UNFRACTIONATED)
Heparin Unfractionated: 0.17 [IU]/mL — ABNORMAL LOW (ref 0.30–0.70)
Heparin Unfractionated: 0.31 [IU]/mL (ref 0.30–0.70)

## 2023-02-28 LAB — BASIC METABOLIC PANEL
Anion gap: 9 (ref 5–15)
BUN: 13 mg/dL (ref 8–23)
CO2: 27 mmol/L (ref 22–32)
Calcium: 9 mg/dL (ref 8.9–10.3)
Chloride: 105 mmol/L (ref 98–111)
Creatinine, Ser: 0.95 mg/dL (ref 0.61–1.24)
GFR, Estimated: >60 mL/min (ref >60)
Glucose, Bld: 125 mg/dL — ABNORMAL HIGH (ref 70–99)
Potassium: 4.1 mmol/L (ref 3.5–5.1)
Sodium: 141 mmol/L (ref 135–145)

## 2023-02-28 LAB — CBC
HCT: 40.3 % (ref 39.0–52.0)
Hemoglobin: 13.3 g/dL (ref 13.0–17.0)
MCH: 27.8 pg (ref 26.0–34.0)
MCHC: 33 g/dL (ref 30.0–36.0)
MCV: 84.3 fL (ref 80.0–100.0)
Platelets: 150 10*3/uL (ref 150–400)
RBC: 4.78 MIL/uL (ref 4.22–5.81)
RDW: 13.6 % (ref 11.5–15.5)
WBC: 4.2 10*3/uL (ref 4.0–10.5)
nRBC: 0 % (ref 0.0–0.2)

## 2023-02-28 LAB — VAS US DOPPLER PRE CABG
Left ABI: 1.12
Right ABI: 1.03

## 2023-02-28 NOTE — Plan of Care (Signed)
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
## 2023-02-28 NOTE — Progress Notes (Addendum)
 Rounding Note    Patient Name: Vincent [name] Date of Encounter: 02/28/2023  [facility] Cardiologist: None   Subjective   Patient denies any symptoms this am. He is eager to get more information about scheduling CABG and recovery process.   Inpatient Medications    Scheduled Meds:  amLODipine  10 mg Oral Daily   aspirin  81 mg Oral Daily   atorvastatin  80 mg Oral QPM   sodium chloride flush  3 mL Intravenous Q12H   tamsulosin  0.4 mg Oral Daily   Continuous Infusions:  sodium chloride     heparin 1,350 Units/hr (02/28/23 0414)   PRN Meds: sodium chloride, acetaminophen, nitroGLYCERIN, ondansetron (ZOFRAN) IV, sodium chloride flush   Vital Signs    Vitals:   02/27/23 2013 02/27/23 2326 02/28/23 0349 02/28/23 0813  BP: 129/84 127/69 136/79 139/77  Pulse: 64 (!) 53 (!) 54 67  Resp: 18 18 18 20   Temp: 98.2 F (36.8 C) 98.1 F (36.7 C) 97.9 F (36.6 C) 97.8 F (36.6 C)  TempSrc: Oral Oral Oral Oral  SpO2: 98% 98% 98% 98%  Weight:      Height:        Intake/Output Summary (Last 24 hours) at 02/28/2023 0838 Last data filed at 02/28/2023 0414 Gross per 24 hour  Intake 245.05 ml  Output 300 ml  Net -54.95 ml      02/27/2023    1:30 PM 02/26/2023    6:29 PM 02/26/2023    1:12 PM  Last 3 Weights  Weight (lbs) 214 lb 12.8 oz 219 lb 12.8 oz 219 lb 12.8 oz  Weight (kg) 97.433 kg 99.7 kg 99.701 kg      Telemetry    Sinus bradycardia, HR 60's with Bigeminy PVC occasionally - Personally Reviewed   Physical Exam   GEN: Laying in bed with No acute distress.   Neck: No JVD Cardiac: RRR, no murmurs, rubs, or gallops; +2 radial pulse Respiratory: Clear to auscultation bilaterally. GI: Soft, nontender, non-distended  MS: No edema; No deformity. R radial access site without hematoma or irregularities Neuro:  Nonfocal  Psych: Normal affect   Labs    High Sensitivity Troponin:   Recent Labs  Lab 02/26/23 1837 02/26/23 2051  TROPONINIHS 374* 430*      Chemistry Recent Labs  Lab 02/26/23 1837 02/27/23 0517 02/28/23 0020  NA 143 141 141  K 3.4* 3.3* 4.1  CL 103 105 105  CO2 25 28 27   GLUCOSE 89 150* 125*  BUN 11 13 13   CREATININE 0.96 1.33* 0.95  CALCIUM 9.3 8.9 9.0  PROT  --  6.2*  --   ALBUMIN  --  3.3*  --   AST  --  23  --   ALT  --  20  --   ALKPHOS  --  46  --   BILITOT  --  0.7  --   GFRNONAA >60 59* >60  ANIONGAP 15 8 9     Lipids  Recent Labs  Lab 02/27/23 0517  CHOL 123  TRIG 49  HDL 47  LDLCALC 66  CHOLHDL 2.6    Hematology Recent Labs  Lab 02/26/23 1837 02/27/23 0517 02/28/23 0020  WBC 5.5 3.7* 4.2  RBC 5.02 4.63 4.78  HGB 14.0 12.7* 13.3  HCT 42.6 39.6 40.3  MCV 84.9 85.5 84.3  MCH 27.9 27.4 27.8  MCHC 32.9 32.1 33.0  RDW 13.4 13.7 13.6  PLT 170 143* 150   Thyroid  Recent  Labs  Lab 02/27/23 0517  TSH 1.323    BNPNo results for input(s): "BNP", "PROBNP" in the last 168 hours.  DDimer No results for input(s): "DDIMER" in the last 168 hours.   Radiology    ECHOCARDIOGRAM COMPLETE Result Date: 02/27/2023    ECHOCARDIOGRAM REPORT   Patient Name:   Vincent [name] Date of Exam: 02/27/2023 Medical Rec #:  [id number]     Height:       71.0 in Accession #:    [id number]    Weight:       219.8 lb Date of Birth:  01-29-1955      BSA:          2.195 m Patient Age:    67 years      BP:           147/84 mmHg Patient Gender: M             HR:           66 bpm. Exam Location:  Inpatient Procedure: 2D Echo, Cardiac Doppler and Color Doppler (Both Spectral and Color            Flow Doppler were utilized during procedure). Indications:    NSTEMI I21.4  History:        Patient has no prior history of Echocardiogram examinations.                 Previous Myocardial Infarction and CAD; Risk Factors:Diabetes                 and Hypertension.  Sonographer:    Darlys Gales Referring Phys: Donavan Burnet IMPRESSIONS  1. Left ventricular ejection fraction, by estimation, is 45 to 50%. The left ventricle has mildly  decreased function. The left ventricle demonstrates regional wall motion abnormalities (see scoring diagram/findings for description). Left ventricular diastolic parameters are consistent with Grade I diastolic dysfunction (impaired relaxation).  2. Right ventricular systolic function is normal. The right ventricular size is normal.  3. The mitral valve is normal in structure. Mild mitral valve regurgitation. No evidence of mitral stenosis.  4. The aortic valve is tricuspid. Aortic valve regurgitation is mild. No aortic stenosis is present. Aortic valve area, by VTI measures 2.14 cm. Aortic valve mean gradient measures 4.0 mmHg. Aortic valve Vmax measures 1.37 m/s.  5. The inferior vena cava is normal in size with greater than 50% respiratory variability, suggesting right atrial pressure of 3 mmHg. FINDINGS  Left Ventricle: Left ventricular ejection fraction, by estimation, is 45 to 50%. The left ventricle has mildly decreased function. The left ventricle demonstrates regional wall motion abnormalities. Strain imaging was not performed. The left ventricular  internal cavity size was normal in size. There is no left ventricular hypertrophy. Left ventricular diastolic parameters are consistent with Grade I diastolic dysfunction (impaired relaxation).  LV Wall Scoring: The basal inferolateral segment, apical lateral segment, mid anterolateral segment, and basal inferior segment are akinetic. The basal anterolateral segment is normal. Right Ventricle: The right ventricular size is normal. No increase in right ventricular wall thickness. Right ventricular systolic function is normal. Left Atrium: Left atrial size was normal in size. Right Atrium: Right atrial size was normal in size. Pericardium: There is no evidence of pericardial effusion. Mitral Valve: The mitral valve is normal in structure. Mild mitral valve regurgitation. No evidence of mitral valve stenosis. Tricuspid Valve: The tricuspid valve is normal in  structure. Tricuspid valve regurgitation is trivial. No evidence of tricuspid stenosis. Aortic Valve:  The aortic valve is tricuspid. Aortic valve regurgitation is mild. No aortic stenosis is present. Aortic valve mean gradient measures 4.0 mmHg. Aortic valve peak gradient measures 7.5 mmHg. Aortic valve area, by VTI measures 2.14 cm. Pulmonic Valve: The pulmonic valve was normal in structure. Pulmonic valve regurgitation is not visualized. No evidence of pulmonic stenosis. Aorta: The aortic root is normal in size and structure. Venous: The inferior vena cava is normal in size with greater than 50% respiratory variability, suggesting right atrial pressure of 3 mmHg. IAS/Shunts: No atrial level shunt detected by color flow Doppler. Additional Comments: 3D imaging was not performed.  LEFT VENTRICLE PLAX 2D LVIDd:         5.90 cm   Diastology LVIDs:         3.90 cm   LV e' medial:    4.57 cm/s LV PW:         1.10 cm   LV E/e' medial:  15.8 LV IVS:        1.00 cm   LV e' lateral:   9.68 cm/s LVOT diam:     2.00 cm   LV E/e' lateral: 7.5 LV SV:         66 LV SV Index:   30 LVOT Area:     3.14 cm  RIGHT VENTRICLE RV S prime:     10.60 cm/s TAPSE (M-mode): 2.0 cm LEFT ATRIUM             Index        RIGHT ATRIUM           Index LA Vol (A2C):   60.5 ml 27.56 ml/m  RA Area:     13.40 cm LA Vol (A4C):   51.9 ml 23.65 ml/m  RA Volume:   33.00 ml  15.04 ml/m LA Biplane Vol: 59.2 ml 26.97 ml/m  AORTIC VALVE AV Area (Vmax):    2.09 cm AV Area (Vmean):   2.19 cm AV Area (VTI):     2.14 cm AV Vmax:           137.00 cm/s AV Vmean:          101.000 cm/s AV VTI:            0.308 m AV Peak Grad:      7.5 mmHg AV Mean Grad:      4.0 mmHg LVOT Vmax:         91.00 cm/s LVOT Vmean:        70.300 cm/s LVOT VTI:          0.210 m LVOT/AV VTI ratio: 0.68  AORTA Ao Root diam: 3.60 cm MITRAL VALVE               TRICUSPID VALVE MV Area (PHT): 3.40 cm    TR Peak grad:   24.4 mmHg MV Decel Time: 223 msec    TR Vmax:        247.00 cm/s MV  E velocity: 72.30 cm/s MV A velocity: 89.10 cm/s  SHUNTS MV E/A ratio:  0.81        Systemic VTI:  0.21 m                            Systemic Diam: 2.00 cm Donato Schultz MD Electronically signed by Donato Schultz MD Signature Date/Time: 02/27/2023/1:35:07 PM    Final    CARDIAC CATHETERIZATION Result Date: 02/27/2023   Dist RCA lesion is 50%  stenosed. 1.  Severe 75% proximal left main stenosis 2.  Moderate diffuse proximal LAD stenosis with severe first diagonal stenosis, unchanged from previous cath study 3.  Mild nonobstructive left circumflex stenosis 4.  Critical proximal 95% stenosis of a dominant RCA with total occlusion of the acute marginal branch and mild diffuse distal vessel disease with a graftable PDA 5.  Mild segmental LV dysfunction with basal inferior akinesis and LVEF estimated at 45 to 50% Recommendations: Cardiac surgical consultation for CABG in the setting of left main and multivessel CAD.  Resume IV heparin 2 hours after TR band off.   DG Chest 2 View Result Date: 02/26/2023 CLINICAL DATA:  Abnormal EKG EXAM: CHEST - 2 VIEW COMPARISON:  11/25/2016 FINDINGS: The heart size and mediastinal contours are within normal limits. Both lungs are clear. The visualized skeletal structures are unremarkable. IMPRESSION: No active cardiopulmonary disease. Electronically Signed   By: Jasmine Pang M.D.   On: 02/26/2023 19:49    Cardiac Studies   ECHO 02/27/23 IMPRESSIONS  Left Ventricle: The left ventricular size is normal. There is mild left ventricular systolic dysfunction. LV end diastolic pressure is normal. The left ventricular ejection fraction is 45-50% by visual estimate. There are LV function abnormalities due to segmental dysfunction.   1. Left ventricular ejection fraction, by estimation, is 45 to 50%. The  left ventricle has mildly decreased function. The left ventricle  demonstrates regional wall motion abnormalities (see scoring  diagram/findings for description). Left ventricular   diastolic parameters are consistent with Grade I diastolic dysfunction  (impaired relaxation).   2. Right ventricular systolic function is normal. The right ventricular  size is normal.   3. The mitral valve is normal in structure. Mild mitral valve  regurgitation. No evidence of mitral stenosis.   4. The aortic valve is tricuspid. Aortic valve regurgitation is mild. No  aortic stenosis is present. Aortic valve area, by VTI measures 2.14 cm.  Aortic valve mean gradient measures 4.0 mmHg. Aortic valve Vmax measures  1.37 m/s.   5. The inferior vena cava is normal in size with greater than 50%  respiratory variability, suggesting right atrial pressure of 3 mmHg.   Diagnostic Dominance: Right Left Main  Ost LM to Mid LM lesion is 75% stenosed.    Left Anterior Descending  Prox LAD lesion is 60% stenosed.    First Diagonal Branch  Ost 1st Diag lesion is 60% stenosed.  Ost 1st Diag to 1st Diag lesion is 90% stenosed.    Left Circumflex  Prox Cx to Mid Cx lesion is 40% stenosed.    Right Coronary Artery  Prox RCA to Mid RCA lesion is 95% stenosed.  Dist RCA lesion is 50% stenosed.    Acute Marginal Branch  Acute Mrg lesion is 100% stenosed.    Intervention   No interventions have been documented.    Intervention  Assessment & Plan    69 y.o. male with known CAD presents with progressive angina and NSTEMI. History of HTN, HLD, prediabetes.   NSTEMI.  Known CAD  - Cath 2018: severe disease in diagonal with moderate diffuse disease in the proximal LAD and RCA. - Presented to ED on 2/18 with mid chest CP associated with exertion.  - initial EKG: new T wave inversion in inferolateral leads - hsTN 430 > 374 -  Currently on IV heparin, ASA 81 mg, amlodipine 10, and Atorvastatin 80 mg  - in setting of low resting HR, suspect he will not tolerate beta blocker -  Echo 02/27/23: LVEF 45-50%. LV mildly decreased function. +RWMA. G1DD. Mild AR  - LCA 02/27/23: Ost Lm to Mid LM  75%. Prox LAD 60%. Ost 1st Dia 60%. Ost 1st Diag to 1st Diag Lesion 90%. Prox LCX to Mid LCX 40%. Prox RCA to Mid RCA 95%. Distal RCA 50%. Acute Mrg lesion is 100%. No intervention.  - per Gershon Crane, PA-C, patient appears to be good candidate for CABG. Awaiting surgeon report.  - this am pt denies any symptoms.  - Reported only time he ever experienced CP was associated with exertion at work.  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  - He is advised to avoid heavy lifting for 3 month path CABG. Also, post-CABG hospital stay is approx. 5 days.   HTN.  - On ARB/ HCT/amlodipine at home.  - Held HCT and ARB yesterday with Cr 1.33. Improved to 0.95 this am. - BP remains stable 130's/70's - Currently on amlodipine 10 mg  Hypercholesterolemia.  - On high dose statin but still LDL 130 range. Will need lipid referral as outpatient to consider PCSK 9 inhibitor.  - Discussed goal LDL of < 55. Patient will follow up with outpatient lipid clinic.   Otherwise managed by PCP  - Prediabetes. (A1C: 6.3)     For questions or updates, please contact [facility] Please consult [email or web address] for contact info under        Signed, Basilio Cairo, PA-C  02/28/2023, 8:38 AM

## 2023-02-28 NOTE — Progress Notes (Signed)
 Pt received OHS book, Move in the Tube sheet, OHS careguide, and incentive spirometer (IS). Pt was educated on approx length of surgery and stay, importance of ambulation and using IS, restrictions, home needs, and CRPII.  Pt IS value  DELRICO MINEHART MS, ACSM-CEP 02/28/2023 11:05 AM

## 2023-03-01 DIAGNOSIS — I214 Non-ST elevation (NSTEMI) myocardial infarction: Secondary | ICD-10-CM | POA: Diagnosis not present

## 2023-03-01 LAB — CBC
HCT: 39.9 % (ref 39.0–52.0)
Hemoglobin: 13.3 g/dL (ref 13.0–17.0)
MCH: 27.8 pg (ref 26.0–34.0)
MCHC: 33.3 g/dL (ref 30.0–36.0)
MCV: 83.5 fL (ref 80.0–100.0)
Platelets: 146 10*3/uL — ABNORMAL LOW (ref 150–400)
RBC: 4.78 MIL/uL (ref 4.22–5.81)
RDW: 13.3 % (ref 11.5–15.5)
WBC: 5.5 10*3/uL (ref 4.0–10.5)
nRBC: 0 % (ref 0.0–0.2)

## 2023-03-01 LAB — HEPARIN LEVEL (UNFRACTIONATED): Heparin Unfractionated: 0.31 [IU]/mL (ref 0.30–0.70)

## 2023-03-01 LAB — LIPOPROTEIN A (LPA): Lipoprotein (a): 76 nmol/L — ABNORMAL HIGH (ref <75.0)

## 2023-03-01 MED ORDER — HYDRALAZINE HCL 10 MG PO TABS: 10.0000 mg | ORAL_TABLET | Freq: Three times a day (TID) | ORAL | Status: DC

## 2023-03-01 MED ORDER — METOPROLOL TARTRATE 12.5 MG HALF TABLET
12.5000 mg | ORAL_TABLET | Freq: Two times a day (BID) | ORAL | Status: DC
  Administered 2023-03-01 – 2023-03-03 (×6): 12.5 mg via ORAL
  Filled 2023-03-01 (×6): qty 1
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
## 2023-03-01 NOTE — Progress Notes (Signed)
     [address]       [name] [address]             [phone]       OR on Monday  [facility] [place]

## 2023-03-01 NOTE — Progress Notes (Signed)
   Patient Name: Vincent [name] Date of Encounter: 03/01/2023 [facility] Cardiologist: None   Interval Summary  .    Patient resting comfortably in bed. No chest pain or dyspnea this morning.   Vital Signs .    Vitals:   02/28/23 1900 03/01/23 0012 03/01/23 0301 03/01/23 0742  BP: 131/75 126/88 (!) 140/79 (!) 141/83  Pulse: 70 81 65 80  Resp: 18 20 18 16   Temp: 98.2 F (36.8 C) 98.1 F (36.7 C) 98.5 F (36.9 C) 98.3 F (36.8 C)  TempSrc: Oral Oral Oral Oral  SpO2: 94% 97% 96% 92%  Weight:      Height:        Intake/Output Summary (Last 24 hours) at 03/01/2023 0929 Last data filed at 03/01/2023 0746 Gross per 24 hour  Intake 961.63 ml  Output 800 ml  Net 161.63 ml      02/27/2023    1:30 PM 02/26/2023    6:29 PM 02/26/2023    1:12 PM  Last 3 Weights  Weight (lbs) 214 lb 12.8 oz 219 lb 12.8 oz 219 lb 12.8 oz  Weight (kg) 97.433 kg 99.7 kg 99.701 kg      Telemetry/ECG    Sinus rhythm/sinus bradycardia with PVCs- Personally Reviewed  Physical Exam .   GEN: No acute distress.   Neck: No JVD Cardiac: RRR, no murmurs, rubs, or gallops.  Respiratory: Clear to auscultation bilaterally. GI: Soft, nontender, non-distended  MS: No edema  Assessment & Plan .   68 y.o. male with known CAD presents with progressive angina and NSTEMI. History of HTN, HLD, prediabetes.   NSTEMI  Coronary artery disease Patient s/p LHC 2018, 60% stenosis of ostial first diag, 90% stenosis of mid-distal first diag, 40% LM stenosis, 40% prox RCA stenosis, 50% mid LAD stenosis. Patient was medically managed at that time. Presented to the ED with angina, found to have troponin elevation to 430 with new inferolateral TWI. LHC with Ost Lm to Mid LM 75%. Prox LAD 60%. Ost 1st Dia 60%. Ost 1st Diag to 1st Diag Lesion 90%. Prox LCX to Mid LCX 40%. Prox RCA to Mid RCA 95%. Distal RCA 50%. Acute Mrg lesion 100% stenosed. Now pending CT surgery. Continue heparin pending CT surgery next  week. Continue ASA 81mg , Atorvastatin 80mg  Start low dose beta blocker today. Not previously started due to concern of bradycardia but patient appears to have normal HR response to exertion. No heart block. HR seems to be upper 50s-60s at rest only.    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 The basal inferolateral segment, apical lateral segment, mid anterolateral segment, and basal inferior segment are akinetic. Appears euvolemic on exam. Consider GDMT including ACEi/ARB/ARNI, MRA, SGLT2 after surgery.  Hypertension Blood pressure moderately elevated today with home ARB/hydrochlorothiazide held. Continue Amlodipine Add low dose beta blocker As above, will need HF GDMT following surgery  Hypercholesterolemia LDL 130 on high intensity statin. Plan for lipid clinic referral for PCSK9 at discharge.   For questions or updates, please contact [facility] Please consult [email or web address] for contact info under        Signed, Perlie Gold, PA-C

## 2023-03-01 NOTE — Plan of Care (Signed)
  Problem: Cardiac: Goal: Ability to achieve and maintain adequate cardiovascular perfusion will improve Outcome: Progressing   Problem: Clinical Measurements: Goal: Ability to maintain clinical measurements within normal limits will improve Outcome: Progressing Goal: Will remain free from infection Outcome: Progressing Goal: Diagnostic test results will improve Outcome: Progressing Goal: Respiratory complications will improve Outcome: Progressing Goal: Cardiovascular complication will be avoided Outcome: Progressing   Problem: Activity: Goal: Risk for activity intolerance will decrease Outcome: Completed/Met

## 2023-03-01 NOTE — Progress Notes (Signed)
 Mobility Specialist Progress Note;   03/01/23 1115  Mobility  Activity Ambulated independently in hallway  Level of Assistance Standby assist, set-up cues, supervision of patient - no hands on  Assistive Device None  Distance Ambulated (ft) 400 ft  Activity Response Tolerated well  Mobility Referral Yes  Mobility visit 1 Mobility  Mobility Specialist Start Time (ACUTE ONLY) 1115  Mobility Specialist Stop Time (ACUTE ONLY) 1130  Mobility Specialist Time Calculation (min) (ACUTE ONLY) 15 min   Pt eager for mobility. Required no physical assistance during ambulation, SV. VSS throughout. No c/o when asked. Pt returned back to sitting on EOB with all needs met.   Caesar Bookman Mobility Specialist Please contact via SecureChat or [employer] [phone]

## 2023-03-02 DIAGNOSIS — I214 Non-ST elevation (NSTEMI) myocardial infarction: Secondary | ICD-10-CM | POA: Diagnosis not present

## 2023-03-02 LAB — CBC
HCT: 38.1 % — ABNORMAL LOW (ref 39.0–52.0)
Hemoglobin: 12.6 g/dL — ABNORMAL LOW (ref 13.0–17.0)
MCH: 27.8 pg (ref 26.0–34.0)
MCHC: 33.1 g/dL (ref 30.0–36.0)
MCV: 83.9 fL (ref 80.0–100.0)
Platelets: 133 10*3/uL — ABNORMAL LOW (ref 150–400)
RBC: 4.54 MIL/uL (ref 4.22–5.81)
RDW: 13.2 % (ref 11.5–15.5)
WBC: 5.3 10*3/uL (ref 4.0–10.5)
nRBC: 0 % (ref 0.0–0.2)

## 2023-03-02 LAB — URINALYSIS, ROUTINE W REFLEX MICROSCOPIC
Bilirubin Urine: NEGATIVE
Glucose, UA: NEGATIVE mg/dL
Hgb urine dipstick: NEGATIVE
Ketones, ur: NEGATIVE mg/dL
Leukocytes,Ua: NEGATIVE
Nitrite: NEGATIVE
Protein, ur: NEGATIVE mg/dL
Specific Gravity, Urine: 1.019 (ref 1.005–1.030)
pH: 7 (ref 5.0–8.0)

## 2023-03-02 LAB — HEPARIN LEVEL (UNFRACTIONATED)
Heparin Unfractionated: 0.28 [IU]/mL — ABNORMAL LOW (ref 0.30–0.70)
Heparin Unfractionated: >1.1 [IU]/mL — ABNORMAL HIGH (ref 0.30–0.70)
Heparin Unfractionated: 0.43 [IU]/mL (ref 0.30–0.70)

## 2023-03-02 LAB — SURGICAL PCR SCREEN
MRSA, PCR: NEGATIVE
Staphylococcus aureus: NEGATIVE
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
## 2023-03-02 NOTE — Plan of Care (Signed)
  Problem: Education: Goal: Understanding of CV disease, CV risk reduction, and recovery process will improve Outcome: Progressing Goal: Individualized Educational Video(s) Outcome: Progressing   Problem: Activity: Goal: Ability to return to baseline activity level will improve Outcome: Completed/Met   Problem: Cardiovascular: Goal: Ability to achieve and maintain adequate cardiovascular perfusion will improve Outcome: Completed/Met Goal: Vascular access site(s) Level 0-1 will be maintained Outcome: Completed/Met

## 2023-03-02 NOTE — Progress Notes (Signed)
   Patient Name: Vincent [name] Date of Encounter: 03/02/2023 [facility] Cardiologist: None   Interval Summary  .    No problems with angina or dyspnea overnight.  Rare PVCs, no severe bradycardia.  Vital Signs .    Vitals:   03/01/23 0301 03/01/23 0742 03/01/23 2059 03/02/23 0357  BP: (!) 140/79 (!) 141/83 (!) 141/61 129/79  Pulse: 65 80 80 62  Resp: 18 16 20 18   Temp: 98.5 F (36.9 C) 98.3 F (36.8 C) 98.6 F (37 C) 98.3 F (36.8 C)  TempSrc: Oral Oral Oral Oral  SpO2: 96% 92% 94% 98%  Weight:      Height:        Intake/Output Summary (Last 24 hours) at 03/02/2023 0826 Last data filed at 03/02/2023 0357 Gross per 24 hour  Intake 356 ml  Output 200 ml  Net 156 ml      02/27/2023    1:30 PM 02/26/2023    6:29 PM 02/26/2023    1:12 PM  Last 3 Weights  Weight (lbs) 214 lb 12.8 oz 219 lb 12.8 oz 219 lb 12.8 oz  Weight (kg) 97.433 kg 99.7 kg 99.701 kg      Telemetry/ECG    Occasional PVCs, mild sinus bradycardia at night- Personally Reviewed  Physical Exam .   GEN: No acute distress.   Neck: No JVD Cardiac: RRR, no murmurs, rubs, or gallops.  Respiratory: Clear to auscultation bilaterally. GI: Soft, nontender, non-distended  MS: No edema  Assessment & Plan .   68 year old gentleman presenting with small non-STEMI, mildly reduced left ventricular systolic function, found to have severe multivessel coronary artery disease including severe proximal LAD artery stenosis and critical proximal RCA stenosis. Currently asymptomatic. Appearing for CABG on Monday.  [place] continue antiplatelet, anticoagulant, beta-blocker and lipid-lowering therapy and monitor on telemetry. For questions or updates, please contact [facility] Please consult [email or web address] for contact info under        Signed, Thurmon Fair, MD

## 2023-03-03 DIAGNOSIS — I214 Non-ST elevation (NSTEMI) myocardial infarction: Secondary | ICD-10-CM | POA: Diagnosis not present

## 2023-03-03 LAB — CBC
HCT: 39.5 % (ref 39.0–52.0)
Hemoglobin: 13.2 g/dL (ref 13.0–17.0)
MCH: 28.3 pg (ref 26.0–34.0)
MCHC: 33.4 g/dL (ref 30.0–36.0)
MCV: 84.8 fL (ref 80.0–100.0)
Platelets: 127 10*3/uL — ABNORMAL LOW (ref 150–400)
RBC: 4.66 MIL/uL (ref 4.22–5.81)
RDW: 13.4 % (ref 11.5–15.5)
WBC: 5.1 10*3/uL (ref 4.0–10.5)
nRBC: 0 % (ref 0.0–0.2)

## 2023-03-03 LAB — BLOOD GAS, ARTERIAL
Acid-Base Excess: 6.4 mmol/L — ABNORMAL HIGH (ref 0.0–2.0)
Bicarbonate: 30.5 mmol/L — ABNORMAL HIGH (ref 20.0–28.0)
Drawn by: 35849
O2 Saturation: 100 %
Patient temperature: 37
pCO2 arterial: 41 mmHg (ref 32–48)
pH, Arterial: 7.48 — ABNORMAL HIGH (ref 7.35–7.45)
pO2, Arterial: 176 mmHg — ABNORMAL HIGH (ref 83–108)

## 2023-03-03 LAB — PROTIME-INR
INR: 1.1 (ref 0.8–1.2)
Prothrombin Time: 14.4 s (ref 11.4–15.2)

## 2023-03-03 LAB — HEPARIN LEVEL (UNFRACTIONATED): Heparin Unfractionated: 0.31 [IU]/mL (ref 0.30–0.70)

## 2023-03-03 LAB — ABO/RH: ABO/RH(D): O POS

## 2023-03-03 LAB — APTT: aPTT: 79 s — ABNORMAL HIGH (ref 24–36)

## 2023-03-03 LAB — PREPARE RBC (CROSSMATCH)

## 2023-03-03 LAB — SARS CORONAVIRUS 2 BY RT PCR: SARS Coronavirus 2 by RT PCR: NEGATIVE

## 2023-03-03 MED ORDER — VANCOMYCIN HCL 1.5 G IV SOLR
1500.0000 mg | INTRAVENOUS | Status: AC
  Administered 2023-03-04: 1500 mg via INTRAVENOUS
  Filled 2023-03-03: qty 30

## 2023-03-03 MED ORDER — CEFAZOLIN SODIUM-DEXTROSE 2-4 GM/100ML-% IV SOLN
2.0000 g | INTRAVENOUS | Status: AC
Start: 2023-03-04 — End: 2023-03-05
  Administered 2023-03-04: 2 g via INTRAVENOUS
  Filled 2023-03-03: qty 100

## 2023-03-03 MED ORDER — MILRINONE LACTATE IN DEXTROSE 20-5 MG/100ML-% IV SOLN
0.3000 ug/kg/min | INTRAVENOUS | Status: DC
  Filled 2023-03-03: qty 100

## 2023-03-03 MED ORDER — CHLORHEXIDINE GLUCONATE CLOTH 2 % EX PADS: 6.0000 | MEDICATED_PAD | Freq: Once | CUTANEOUS | Status: DC

## 2023-03-03 MED ORDER — NITROGLYCERIN IN D5W 200-5 MCG/ML-% IV SOLN
2.0000 ug/min | INTRAVENOUS | Status: DC
  Filled 2023-03-03: qty 250

## 2023-03-03 MED ORDER — MANNITOL 20 % IV SOLN
INTRAVENOUS | Status: DC
  Filled 2023-03-03: qty 13

## 2023-03-03 MED ORDER — DEXMEDETOMIDINE HCL IN NACL 400 MCG/100ML IV SOLN
0.1000 ug/kg/h | INTRAVENOUS | Status: AC
  Administered 2023-03-04: .7 ug/kg/h via INTRAVENOUS
  Filled 2023-03-03: qty 100

## 2023-03-03 MED ORDER — INSULIN REGULAR(HUMAN) IN NACL 100-0.9 UT/100ML-% IV SOLN
INTRAVENOUS | Status: AC
  Administered 2023-03-04: 2.6 [IU]/h via INTRAVENOUS
  Filled 2023-03-03: qty 100

## 2023-03-03 MED ORDER — PLASMA-LYTE A IV SOLN
INTRAVENOUS | Status: DC
  Filled 2023-03-03: qty 2.5

## 2023-03-03 MED ORDER — NOREPINEPHRINE 4 MG/250ML-% IV SOLN
0.0000 ug/min | INTRAVENOUS | Status: DC
  Filled 2023-03-03: qty 250

## 2023-03-03 MED ORDER — POTASSIUM CHLORIDE 2 MEQ/ML IV SOLN
80.0000 meq | INTRAVENOUS | Status: DC
  Filled 2023-03-03: qty 40

## 2023-03-03 MED ORDER — TEMAZEPAM 15 MG PO CAPS: 15.0000 mg | ORAL_CAPSULE | Freq: Once | ORAL | Status: DC | PRN

## 2023-03-03 MED ORDER — TRANEXAMIC ACID (OHS) PUMP PRIME SOLUTION
2.0000 mg/kg | INTRAVENOUS | Status: DC
  Filled 2023-03-03: qty 1.95

## 2023-03-03 MED ORDER — PHENYLEPHRINE HCL-NACL 20-0.9 MG/250ML-% IV SOLN
30.0000 ug/min | INTRAVENOUS | Status: AC
  Administered 2023-03-04: 40 ug/min via INTRAVENOUS
  Filled 2023-03-03: qty 250

## 2023-03-03 MED ORDER — EPINEPHRINE HCL 5 MG/250ML IV SOLN IN NS
0.0000 ug/min | INTRAVENOUS | Status: DC
Start: 2023-03-04 — End: 2023-03-05
  Filled 2023-03-03: qty 250

## 2023-03-03 MED ORDER — CHLORHEXIDINE GLUCONATE CLOTH 2 % EX PADS
6.0000 | MEDICATED_PAD | Freq: Once | CUTANEOUS | Status: DC
Start: 2023-03-03 — End: 2023-03-04

## 2023-03-03 MED ORDER — CHLORHEXIDINE GLUCONATE CLOTH 2 % EX PADS
6.0000 | MEDICATED_PAD | Freq: Once | CUTANEOUS | Status: AC
  Administered 2023-03-03: 6 via TOPICAL

## 2023-03-03 MED ORDER — BISACODYL 5 MG PO TBEC: 5.0000 mg | DELAYED_RELEASE_TABLET | Freq: Once | ORAL | Status: DC

## 2023-03-03 MED ORDER — TRANEXAMIC ACID 1000 MG/10ML IV SOLN
1.5000 mg/kg/h | INTRAVENOUS | Status: AC
  Administered 2023-03-04: 1.5 mg/kg/h via INTRAVENOUS
  Filled 2023-03-03: qty 20

## 2023-03-03 MED ORDER — HEPARIN 30,000 UNITS/1000 ML (OHS) CELLSAVER SOLUTION
Status: DC
  Filled 2023-03-03: qty 1000

## 2023-03-03 MED ORDER — TRANEXAMIC ACID (OHS) BOLUS VIA INFUSION
15.0000 mg/kg | INTRAVENOUS | Status: AC
  Administered 2023-03-04: 1461 mg via INTRAVENOUS
  Filled 2023-03-03: qty 1461

## 2023-03-03 MED ORDER — CHLORHEXIDINE GLUCONATE 0.12 % MT SOLN
15.0000 mL | Freq: Once | OROMUCOSAL | Status: AC
  Administered 2023-03-04: 15 mL via OROMUCOSAL
  Filled 2023-03-03: qty 15

## 2023-03-03 MED ORDER — METOPROLOL TARTRATE 12.5 MG HALF TABLET
12.5000 mg | ORAL_TABLET | Freq: Once | ORAL | Status: AC
  Administered 2023-03-04: 12.5 mg via ORAL
  Filled 2023-03-03: qty 1

## 2023-03-03 MED FILL — Heparin Sodium (Porcine) Inj 1000 Unit/ML: INTRAMUSCULAR | Qty: 10 | Status: AC

## 2023-03-03 NOTE — Progress Notes (Signed)
   Patient Name: Vincent [name] Date of Encounter: 03/03/2023 [facility] Cardiologist: None   Interval Summary  .    No angina or other cardiovascular complaints overnight  Vital Signs .    Vitals:   03/02/23 0945 03/02/23 2106 03/03/23 0426 03/03/23 0952  BP: 127/78 139/72 (!) 145/85 133/72  Pulse: 61 80 66 78  Resp: 16 18 20    Temp: 97.6 F (36.4 C) 98 F (36.7 C) 98.5 F (36.9 C)   TempSrc: Oral Oral Oral   SpO2: 93% 96% 95%   Weight:      Height:        Intake/Output Summary (Last 24 hours) at 03/03/2023 1045 Last data filed at 03/03/2023 0940 Gross per 24 hour  Intake 118 ml  Output --  Net 118 ml      02/27/2023    1:30 PM 02/26/2023    6:29 PM 02/26/2023    1:12 PM  Last 3 Weights  Weight (lbs) 214 lb 12.8 oz 219 lb 12.8 oz 219 lb 12.8 oz  Weight (kg) 97.433 kg 99.7 kg 99.701 kg      Telemetry/ECG     - Personally Reviewed  Physical Exam .   GEN: No acute distress.   Neck: No JVD Cardiac: RRR, no murmurs, rubs, or gallops.  Respiratory: Clear to auscultation bilaterally. GI: Soft, nontender, non-distended  MS: No edema  Assessment & Plan .    68 year old gentleman presenting with small line STEMI and mildly reduced LV systolic function found to have severe multivessel coronary artery disease including severe proximal LAD artery stenosis and critical proximal RCA stenosis, awaiting bypass surgery tomorrow. Asymptomatic today.  Continue antiplatelet/ anticoagulant/beta-blocker/lipid-lowering therapy and telemetry monitoring until bypass surgery. Questions regarding procedure and postoperative progress reviewed. For questions or updates, please contact [facility] Please consult [email or web address] for contact info under        Signed, Thurmon Fair, MD
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
## 2023-03-03 NOTE — Plan of Care (Signed)
  Problem: Education: Goal: Understanding of cardiac disease, CV risk reduction, and recovery process will improve Outcome: Progressing   Problem: Education: Goal: Knowledge of General Education information will improve Description: Including pain rating scale, medication(s)/side effects and non-pharmacologic comfort measures Outcome: Progressing   Problem: Coping: Goal: Level of anxiety will decrease Outcome: Progressing   Problem: Nutrition: Goal: Adequate nutrition will be maintained Outcome: Completed/Met   Problem: Elimination: Goal: Will not experience complications related to bowel motility Outcome: Completed/Met Goal: Will not experience complications related to urinary retention Outcome: Completed/Met   Problem: Pain Managment: Goal: General experience of comfort will improve and/or be controlled Outcome: Completed/Met   Problem: Safety: Goal: Ability to remain free from injury will improve Outcome: Completed/Met

## 2023-03-03 NOTE — Plan of Care (Signed)
  Problem: Education: Goal: Understanding of cardiac disease, CV risk reduction, and recovery process will improve Outcome: Progressing Goal: Individualized Educational Video(s) Outcome: Progressing   Problem: Activity: Goal: Ability to tolerate increased activity will improve Outcome: Progressing   Problem: Cardiac: Goal: Ability to achieve and maintain adequate cardiovascular perfusion will improve Outcome: Progressing   Problem: Health Behavior/Discharge Planning: Goal: Ability to safely manage health-related needs after discharge will improve Outcome: Progressing   Problem: Education: Goal: Knowledge of General Education information will improve Description: Including pain rating scale, medication(s)/side effects and non-pharmacologic comfort measures Outcome: Progressing   Problem: Health Behavior/Discharge Planning: Goal: Ability to manage health-related needs will improve Outcome: Progressing   Problem: Clinical Measurements: Goal: Ability to maintain clinical measurements within normal limits will improve Outcome: Progressing Goal: Will remain free from infection Outcome: Progressing Goal: Diagnostic test results will improve Outcome: Progressing Goal: Respiratory complications will improve Outcome: Progressing Goal: Cardiovascular complication will be avoided Outcome: Progressing   Problem: Nutrition: Goal: Adequate nutrition will be maintained Outcome: Progressing

## 2023-03-04 ENCOUNTER — Inpatient Hospital Stay (HOSPITAL_COMMUNITY): Payer: 59 | Admitting: Certified Registered Nurse Anesthetist

## 2023-03-04 ENCOUNTER — Inpatient Hospital Stay (HOSPITAL_COMMUNITY): Payer: 59

## 2023-03-04 ENCOUNTER — Encounter (HOSPITAL_COMMUNITY): Payer: Self-pay | Admitting: Cardiology

## 2023-03-04 ENCOUNTER — Inpatient Hospital Stay (HOSPITAL_COMMUNITY)
Admission: EM | Disposition: A | Discharge status: Home / Self Care | Payer: Self-pay | Source: Home / Self Care | Attending: Thoracic Surgery (Cardiothoracic Vascular Surgery)

## 2023-03-04 ENCOUNTER — Other Ambulatory Visit: Payer: Self-pay

## 2023-03-04 DIAGNOSIS — E785 Hyperlipidemia, unspecified: Secondary | ICD-10-CM | POA: Diagnosis not present

## 2023-03-04 DIAGNOSIS — E119 Type 2 diabetes mellitus without complications: Secondary | ICD-10-CM

## 2023-03-04 DIAGNOSIS — Z951 Presence of aortocoronary bypass graft: Secondary | ICD-10-CM

## 2023-03-04 DIAGNOSIS — R739 Hyperglycemia, unspecified: Secondary | ICD-10-CM

## 2023-03-04 DIAGNOSIS — I25110 Atherosclerotic heart disease of native coronary artery with unstable angina pectoris: Secondary | ICD-10-CM | POA: Diagnosis not present

## 2023-03-04 DIAGNOSIS — I10 Essential (primary) hypertension: Secondary | ICD-10-CM | POA: Diagnosis not present

## 2023-03-04 DIAGNOSIS — I2510 Atherosclerotic heart disease of native coronary artery without angina pectoris: Secondary | ICD-10-CM

## 2023-03-04 DIAGNOSIS — I214 Non-ST elevation (NSTEMI) myocardial infarction: Secondary | ICD-10-CM | POA: Diagnosis not present

## 2023-03-04 DIAGNOSIS — Z9911 Dependence on respirator [ventilator] status: Secondary | ICD-10-CM

## 2023-03-04 DIAGNOSIS — I252 Old myocardial infarction: Secondary | ICD-10-CM

## 2023-03-04 HISTORY — DX: Dependence on respirator (ventilator) status: Z99.11

## 2023-03-04 HISTORY — PX: TEE WITHOUT CARDIOVERSION: SHX5443

## 2023-03-04 HISTORY — DX: Presence of aortocoronary bypass graft: Z95.1

## 2023-03-04 HISTORY — PX: CORONARY ARTERY BYPASS GRAFT: SHX141

## 2023-03-04 HISTORY — DX: Hyperglycemia, unspecified: R73.9

## 2023-03-04 HISTORY — DX: Atherosclerotic heart disease of native coronary artery without angina pectoris: I25.10

## 2023-03-04 LAB — GLUCOSE, CAPILLARY
Glucose-Capillary: 96 mg/dL (ref 70–99)
Glucose-Capillary: 88 mg/dL (ref 70–99)
Glucose-Capillary: 110 mg/dL — ABNORMAL HIGH (ref 70–99)
Glucose-Capillary: 97 mg/dL (ref 70–99)
Glucose-Capillary: 88 mg/dL (ref 70–99)
Glucose-Capillary: 108 mg/dL — ABNORMAL HIGH (ref 70–99)
Glucose-Capillary: 138 mg/dL — ABNORMAL HIGH (ref 70–99)
Glucose-Capillary: 123 mg/dL — ABNORMAL HIGH (ref 70–99)
Glucose-Capillary: 114 mg/dL — ABNORMAL HIGH (ref 70–99)

## 2023-03-04 LAB — POCT I-STAT 7, (LYTES, BLD GAS, ICA,H+H)
Acid-Base Excess: 2 mmol/L (ref 0.0–2.0)
Acid-Base Excess: 1 mmol/L (ref 0.0–2.0)
Acid-Base Excess: 2 mmol/L (ref 0.0–2.0)
Acid-Base Excess: 0 mmol/L (ref 0.0–2.0)
Acid-base deficit: 1 mmol/L (ref 0.0–2.0)
Acid-base deficit: 4 mmol/L — ABNORMAL HIGH (ref 0.0–2.0)
Acid-base deficit: 3 mmol/L — ABNORMAL HIGH (ref 0.0–2.0)
Bicarbonate: 27.7 mmol/L (ref 20.0–28.0)
Bicarbonate: 26.5 mmol/L (ref 20.0–28.0)
Bicarbonate: 26.8 mmol/L (ref 20.0–28.0)
Bicarbonate: 26.1 mmol/L (ref 20.0–28.0)
Bicarbonate: 24.3 mmol/L (ref 20.0–28.0)
Bicarbonate: 22.2 mmol/L (ref 20.0–28.0)
Bicarbonate: 23.1 mmol/L (ref 20.0–28.0)
Calcium, Ion: 1.21 mmol/L (ref 1.15–1.40)
Calcium, Ion: 1.06 mmol/L — ABNORMAL LOW (ref 1.15–1.40)
Calcium, Ion: 1.09 mmol/L — ABNORMAL LOW (ref 1.15–1.40)
Calcium, Ion: 1.26 mmol/L (ref 1.15–1.40)
Calcium, Ion: 1.24 mmol/L (ref 1.15–1.40)
Calcium, Ion: 1.17 mmol/L (ref 1.15–1.40)
Calcium, Ion: 1.16 mmol/L (ref 1.15–1.40)
HCT: 37 % — ABNORMAL LOW (ref 39.0–52.0)
HCT: 28 % — ABNORMAL LOW (ref 39.0–52.0)
HCT: 30 % — ABNORMAL LOW (ref 39.0–52.0)
HCT: 33 % — ABNORMAL LOW (ref 39.0–52.0)
HCT: 29 % — ABNORMAL LOW (ref 39.0–52.0)
HCT: 30 % — ABNORMAL LOW (ref 39.0–52.0)
HCT: 30 % — ABNORMAL LOW (ref 39.0–52.0)
Hemoglobin: 12.6 g/dL — ABNORMAL LOW (ref 13.0–17.0)
Hemoglobin: 9.5 g/dL — ABNORMAL LOW (ref 13.0–17.0)
Hemoglobin: 10.2 g/dL — ABNORMAL LOW (ref 13.0–17.0)
Hemoglobin: 11.2 g/dL — ABNORMAL LOW (ref 13.0–17.0)
Hemoglobin: 9.9 g/dL — ABNORMAL LOW (ref 13.0–17.0)
Hemoglobin: 10.2 g/dL — ABNORMAL LOW (ref 13.0–17.0)
Hemoglobin: 10.2 g/dL — ABNORMAL LOW (ref 13.0–17.0)
O2 Saturation: 100 %
O2 Saturation: 100 %
O2 Saturation: 100 %
O2 Saturation: 99 %
O2 Saturation: 97 %
O2 Saturation: 95 %
O2 Saturation: 93 %
Patient temperature: 36.1
Patient temperature: 37.3
Patient temperature: 37.7
Potassium: 3.8 mmol/L (ref 3.5–5.1)
Potassium: 4.1 mmol/L (ref 3.5–5.1)
Potassium: 4.2 mmol/L (ref 3.5–5.1)
Potassium: 3.8 mmol/L (ref 3.5–5.1)
Potassium: 3.8 mmol/L (ref 3.5–5.1)
Potassium: 4.2 mmol/L (ref 3.5–5.1)
Potassium: 4.2 mmol/L (ref 3.5–5.1)
Sodium: 142 mmol/L (ref 135–145)
Sodium: 140 mmol/L (ref 135–145)
Sodium: 140 mmol/L (ref 135–145)
Sodium: 141 mmol/L (ref 135–145)
Sodium: 142 mmol/L (ref 135–145)
Sodium: 142 mmol/L (ref 135–145)
Sodium: 140 mmol/L (ref 135–145)
TCO2: 29 mmol/L (ref 22–32)
TCO2: 28 mmol/L (ref 22–32)
TCO2: 28 mmol/L (ref 22–32)
TCO2: 28 mmol/L (ref 22–32)
TCO2: 26 mmol/L (ref 22–32)
TCO2: 24 mmol/L (ref 22–32)
TCO2: 25 mmol/L (ref 22–32)
pCO2 arterial: 48 mmHg (ref 32–48)
pCO2 arterial: 43.4 mmHg (ref 32–48)
pCO2 arterial: 40.1 mmHg (ref 32–48)
pCO2 arterial: 45.7 mmHg (ref 32–48)
pCO2 arterial: 39.3 mmHg (ref 32–48)
pCO2 arterial: 43.7 mmHg (ref 32–48)
pCO2 arterial: 47.2 mmHg (ref 32–48)
pH, Arterial: 7.369 (ref 7.35–7.45)
pH, Arterial: 7.393 (ref 7.35–7.45)
pH, Arterial: 7.434 (ref 7.35–7.45)
pH, Arterial: 7.365 (ref 7.35–7.45)
pH, Arterial: 7.395 (ref 7.35–7.45)
pH, Arterial: 7.316 — ABNORMAL LOW (ref 7.35–7.45)
pH, Arterial: 7.302 — ABNORMAL LOW (ref 7.35–7.45)
pO2, Arterial: 414 mmHg — ABNORMAL HIGH (ref 83–108)
pO2, Arterial: 359 mmHg — ABNORMAL HIGH (ref 83–108)
pO2, Arterial: 305 mmHg — ABNORMAL HIGH (ref 83–108)
pO2, Arterial: 153 mmHg — ABNORMAL HIGH (ref 83–108)
pO2, Arterial: 92 mmHg (ref 83–108)
pO2, Arterial: 82 mmHg — ABNORMAL LOW (ref 83–108)
pO2, Arterial: 75 mmHg — ABNORMAL LOW (ref 83–108)

## 2023-03-04 LAB — POCT I-STAT, CHEM 8
BUN: 15 mg/dL (ref 8–23)
BUN: 14 mg/dL (ref 8–23)
BUN: 14 mg/dL (ref 8–23)
BUN: 14 mg/dL (ref 8–23)
Calcium, Ion: 1.25 mmol/L (ref 1.15–1.40)
Calcium, Ion: 1.22 mmol/L (ref 1.15–1.40)
Calcium, Ion: 1.07 mmol/L — ABNORMAL LOW (ref 1.15–1.40)
Calcium, Ion: 1.32 mmol/L (ref 1.15–1.40)
Chloride: 103 mmol/L (ref 98–111)
Chloride: 103 mmol/L (ref 98–111)
Chloride: 104 mmol/L (ref 98–111)
Chloride: 104 mmol/L (ref 98–111)
Creatinine, Ser: 0.8 mg/dL (ref 0.61–1.24)
Creatinine, Ser: 0.8 mg/dL (ref 0.61–1.24)
Creatinine, Ser: 0.8 mg/dL (ref 0.61–1.24)
Creatinine, Ser: 0.8 mg/dL (ref 0.61–1.24)
Glucose, Bld: 119 mg/dL — ABNORMAL HIGH (ref 70–99)
Glucose, Bld: 93 mg/dL (ref 70–99)
Glucose, Bld: 99 mg/dL (ref 70–99)
Glucose, Bld: 117 mg/dL — ABNORMAL HIGH (ref 70–99)
HCT: 36 % — ABNORMAL LOW (ref 39.0–52.0)
HCT: 36 % — ABNORMAL LOW (ref 39.0–52.0)
HCT: 31 % — ABNORMAL LOW (ref 39.0–52.0)
HCT: 32 % — ABNORMAL LOW (ref 39.0–52.0)
Hemoglobin: 12.2 g/dL — ABNORMAL LOW (ref 13.0–17.0)
Hemoglobin: 12.2 g/dL — ABNORMAL LOW (ref 13.0–17.0)
Hemoglobin: 10.5 g/dL — ABNORMAL LOW (ref 13.0–17.0)
Hemoglobin: 10.9 g/dL — ABNORMAL LOW (ref 13.0–17.0)
Potassium: 3.8 mmol/L (ref 3.5–5.1)
Potassium: 3.8 mmol/L (ref 3.5–5.1)
Potassium: 4.2 mmol/L (ref 3.5–5.1)
Potassium: 3.7 mmol/L (ref 3.5–5.1)
Sodium: 141 mmol/L (ref 135–145)
Sodium: 141 mmol/L (ref 135–145)
Sodium: 140 mmol/L (ref 135–145)
Sodium: 140 mmol/L (ref 135–145)
TCO2: 29 mmol/L (ref 22–32)
TCO2: 26 mmol/L (ref 22–32)
TCO2: 28 mmol/L (ref 22–32)
TCO2: 25 mmol/L (ref 22–32)

## 2023-03-04 LAB — CBC
HCT: 30.5 % — ABNORMAL LOW (ref 39.0–52.0)
HCT: 33 % — ABNORMAL LOW (ref 39.0–52.0)
HCT: 37.2 % — ABNORMAL LOW (ref 39.0–52.0)
HCT: 38.9 % — ABNORMAL LOW (ref 39.0–52.0)
Hemoglobin: 10.1 g/dL — ABNORMAL LOW (ref 13.0–17.0)
Hemoglobin: 10.8 g/dL — ABNORMAL LOW (ref 13.0–17.0)
Hemoglobin: 12.4 g/dL — ABNORMAL LOW (ref 13.0–17.0)
Hemoglobin: 12.9 g/dL — ABNORMAL LOW (ref 13.0–17.0)
MCH: 28.1 pg (ref 26.0–34.0)
MCH: 28.1 pg (ref 26.0–34.0)
MCH: 28.2 pg (ref 26.0–34.0)
MCH: 28.2 pg (ref 26.0–34.0)
MCHC: 32.7 g/dL (ref 30.0–36.0)
MCHC: 33.1 g/dL (ref 30.0–36.0)
MCHC: 33.2 g/dL (ref 30.0–36.0)
MCHC: 33.3 g/dL (ref 30.0–36.0)
MCV: 84.5 fL (ref 80.0–100.0)
MCV: 84.9 fL (ref 80.0–100.0)
MCV: 85 fL (ref 80.0–100.0)
MCV: 85.7 fL (ref 80.0–100.0)
Platelets: 130 10*3/uL — ABNORMAL LOW (ref 150–400)
Platelets: 131 10*3/uL — ABNORMAL LOW (ref 150–400)
Platelets: 81 10*3/uL — ABNORMAL LOW (ref 150–400)
Platelets: 96 10*3/uL — ABNORMAL LOW (ref 150–400)
RBC: 3.59 MIL/uL — ABNORMAL LOW (ref 4.22–5.81)
RBC: 3.85 MIL/uL — ABNORMAL LOW (ref 4.22–5.81)
RBC: 4.4 MIL/uL (ref 4.22–5.81)
RBC: 4.58 MIL/uL (ref 4.22–5.81)
RDW: 13.4 % (ref 11.5–15.5)
RDW: 13.4 % (ref 11.5–15.5)
RDW: 13.5 % (ref 11.5–15.5)
RDW: 13.5 % (ref 11.5–15.5)
WBC: 10 10*3/uL (ref 4.0–10.5)
WBC: 4.4 10*3/uL (ref 4.0–10.5)
WBC: 4.6 10*3/uL (ref 4.0–10.5)
WBC: 9.2 10*3/uL (ref 4.0–10.5)
nRBC: 0 % (ref 0.0–0.2)
nRBC: 0 % (ref 0.0–0.2)
nRBC: 0 % (ref 0.0–0.2)
nRBC: 0 % (ref 0.0–0.2)

## 2023-03-04 LAB — HEMOGLOBIN AND HEMATOCRIT, BLOOD
HCT: 30.8 % — ABNORMAL LOW (ref 39.0–52.0)
Hemoglobin: 10.2 g/dL — ABNORMAL LOW (ref 13.0–17.0)

## 2023-03-04 LAB — POCT I-STAT EG7
Acid-Base Excess: 4 mmol/L — ABNORMAL HIGH (ref 0.0–2.0)
Bicarbonate: 29.6 mmol/L — ABNORMAL HIGH (ref 20.0–28.0)
Calcium, Ion: 1.09 mmol/L — ABNORMAL LOW (ref 1.15–1.40)
HCT: 31 % — ABNORMAL LOW (ref 39.0–52.0)
Hemoglobin: 10.5 g/dL — ABNORMAL LOW (ref 13.0–17.0)
O2 Saturation: 76 %
Potassium: 3.6 mmol/L (ref 3.5–5.1)
Sodium: 144 mmol/L (ref 135–145)
TCO2: 31 mmol/L (ref 22–32)
pCO2, Ven: 48.1 mmHg (ref 44–60)
pH, Ven: 7.397 (ref 7.25–7.43)
pO2, Ven: 42 mmHg (ref 32–45)

## 2023-03-04 LAB — BASIC METABOLIC PANEL
Anion gap: 7 (ref 5–15)
Anion gap: 9 (ref 5–15)
BUN: 14 mg/dL (ref 8–23)
BUN: 17 mg/dL (ref 8–23)
CO2: 24 mmol/L (ref 22–32)
CO2: 27 mmol/L (ref 22–32)
Calcium: 8.3 mg/dL — ABNORMAL LOW (ref 8.9–10.3)
Calcium: 8.8 mg/dL — ABNORMAL LOW (ref 8.9–10.3)
Chloride: 105 mmol/L (ref 98–111)
Chloride: 106 mmol/L (ref 98–111)
Creatinine, Ser: 0.96 mg/dL (ref 0.61–1.24)
Creatinine, Ser: 0.97 mg/dL (ref 0.61–1.24)
GFR, Estimated: >60 mL/min (ref >60)
GFR, Estimated: >60 mL/min (ref >60)
Glucose, Bld: 124 mg/dL — ABNORMAL HIGH (ref 70–99)
Glucose, Bld: 143 mg/dL — ABNORMAL HIGH (ref 70–99)
Potassium: 3.8 mmol/L (ref 3.5–5.1)
Potassium: 4.8 mmol/L (ref 3.5–5.1)
Sodium: 139 mmol/L (ref 135–145)
Sodium: 139 mmol/L (ref 135–145)

## 2023-03-04 LAB — APTT: aPTT: 37 s — ABNORMAL HIGH (ref 24–36)

## 2023-03-04 LAB — ECHO INTRAOPERATIVE TEE
Height: 72 in
Weight: 3436.8 [oz_av]

## 2023-03-04 LAB — MAGNESIUM: Magnesium: 2.6 mg/dL — ABNORMAL HIGH (ref 1.7–2.4)

## 2023-03-04 LAB — PROTIME-INR
INR: 1.4 — ABNORMAL HIGH (ref 0.8–1.2)
Prothrombin Time: 17.7 s — ABNORMAL HIGH (ref 11.4–15.2)

## 2023-03-04 LAB — PLATELET COUNT: Platelets: 100 10*3/uL — ABNORMAL LOW (ref 150–400)

## 2023-03-04 LAB — HEPARIN LEVEL (UNFRACTIONATED): Heparin Unfractionated: 0.37 [IU]/mL (ref 0.30–0.70)

## 2023-03-04 SURGERY — CORONARY ARTERY BYPASS GRAFTING (CABG)
Anesthesia: General | Site: Chest

## 2023-03-04 MED ORDER — MIDAZOLAM HCL 2 MG/2ML IJ SOLN: 2.0000 mg | INTRAMUSCULAR | Status: DC | PRN

## 2023-03-04 MED ORDER — PROTAMINE SULFATE 10 MG/ML IV SOLN
INTRAVENOUS | Status: AC
  Filled 2023-03-04: qty 50

## 2023-03-04 MED ORDER — FENTANYL CITRATE (PF) 250 MCG/5ML IJ SOLN
INTRAMUSCULAR | Status: AC
  Filled 2023-03-04: qty 5

## 2023-03-04 MED ORDER — POTASSIUM CHLORIDE 10 MEQ/50ML IV SOLN
10.0000 meq | INTRAVENOUS | Status: AC
  Administered 2023-03-04 (×3): 10 meq via INTRAVENOUS

## 2023-03-04 MED ORDER — SODIUM CHLORIDE (PF) 0.9 % IJ SOLN
INTRAMUSCULAR | Status: AC
Start: 2023-03-04 — End: ?
  Filled 2023-03-04: qty 10

## 2023-03-04 MED ORDER — CEFAZOLIN SODIUM-DEXTROSE 2-4 GM/100ML-% IV SOLN
2.0000 g | Freq: Three times a day (TID) | INTRAVENOUS | Status: AC
  Administered 2023-03-04 – 2023-03-06 (×6): 2 g via INTRAVENOUS
  Filled 2023-03-04 (×6): qty 100

## 2023-03-04 MED ORDER — TAMSULOSIN HCL 0.4 MG PO CAPS
0.4000 mg | ORAL_CAPSULE | Freq: Every day | ORAL | Status: DC
  Administered 2023-03-05 – 2023-03-09 (×5): 0.4 mg via ORAL
  Filled 2023-03-04 (×5): qty 1

## 2023-03-04 MED ORDER — MIDAZOLAM HCL (PF) 5 MG/ML IJ SOLN
INTRAMUSCULAR | Status: DC | PRN
  Administered 2023-03-04: 1 mg via INTRAVENOUS
  Administered 2023-03-04 (×3): 2 mg via INTRAVENOUS
  Administered 2023-03-04: 3 mg via INTRAVENOUS

## 2023-03-04 MED ORDER — VASOPRESSIN 20 UNIT/ML IV SOLN
INTRAVENOUS | Status: AC
  Filled 2023-03-04: qty 1

## 2023-03-04 MED ORDER — ACETAMINOPHEN 500 MG PO TABS
1000.0000 mg | ORAL_TABLET | Freq: Four times a day (QID) | ORAL | Status: DC
  Administered 2023-03-04 – 2023-03-09 (×12): 1000 mg via ORAL
  Filled 2023-03-04 (×14): qty 2

## 2023-03-04 MED ORDER — INSULIN REGULAR(HUMAN) IN NACL 100-0.9 UT/100ML-% IV SOLN: INTRAVENOUS | Status: DC

## 2023-03-04 MED ORDER — HEPARIN SODIUM (PORCINE) 1000 UNIT/ML IJ SOLN
INTRAMUSCULAR | Status: AC
Start: 2023-03-04 — End: ?
  Filled 2023-03-04: qty 10

## 2023-03-04 MED ORDER — SODIUM CHLORIDE 0.9% FLUSH
3.0000 mL | Freq: Two times a day (BID) | INTRAVENOUS | Status: DC
  Administered 2023-03-04: 10 mL via INTRAVENOUS
  Administered 2023-03-04: 3 mL via INTRAVENOUS

## 2023-03-04 MED ORDER — DEXTROSE 50 % IV SOLN: 0.0000 mL | INTRAVENOUS | Status: DC | PRN

## 2023-03-04 MED ORDER — SODIUM CHLORIDE 0.9% FLUSH: 10.0000 mL | INTRAVENOUS | Status: DC | PRN

## 2023-03-04 MED ORDER — SODIUM CHLORIDE 0.9% FLUSH: 3.0000 mL | INTRAVENOUS | Status: DC | PRN

## 2023-03-04 MED ORDER — NICARDIPINE HCL IN NACL 20-0.86 MG/200ML-% IV SOLN
0.0000 mg/h | INTRAVENOUS | Status: DC
  Filled 2023-03-04: qty 200

## 2023-03-04 MED ORDER — 0.9 % SODIUM CHLORIDE (POUR BTL) OPTIME
TOPICAL | Status: DC | PRN
Start: 2023-03-04 — End: 2023-03-04
  Administered 2023-03-04: 5000 mL

## 2023-03-04 MED ORDER — NOREPINEPHRINE 4 MG/250ML-% IV SOLN
0.0000 ug/min | INTRAVENOUS | Status: DC
  Administered 2023-03-05: 2 ug/min via INTRAVENOUS

## 2023-03-04 MED ORDER — ACETAMINOPHEN 160 MG/5ML PO SOLN: 650.0000 mg | Freq: Once | ORAL | Status: DC

## 2023-03-04 MED ORDER — NITROGLYCERIN 0.2 MG/ML ON CALL CATH LAB
INTRAVENOUS | Status: DC | PRN
  Administered 2023-03-04: 40 ug via INTRAVENOUS

## 2023-03-04 MED ORDER — HEPARIN SODIUM (PORCINE) 1000 UNIT/ML IJ SOLN
INTRAMUSCULAR | Status: AC
  Filled 2023-03-04: qty 1

## 2023-03-04 MED ORDER — PROTAMINE SULFATE 10 MG/ML IV SOLN
INTRAVENOUS | Status: DC | PRN
  Administered 2023-03-04: 20 mg via INTRAVENOUS
  Administered 2023-03-04: 330 mg via INTRAVENOUS

## 2023-03-04 MED ORDER — ONDANSETRON HCL 4 MG/2ML IJ SOLN
4.0000 mg | Freq: Four times a day (QID) | INTRAMUSCULAR | Status: DC | PRN
  Filled 2023-03-04: qty 2

## 2023-03-04 MED ORDER — METOPROLOL TARTRATE 25 MG/10 ML ORAL SUSPENSION: 12.5000 mg | Freq: Two times a day (BID) | ORAL | Status: DC

## 2023-03-04 MED ORDER — EPHEDRINE 5 MG/ML INJ
INTRAVENOUS | Status: AC
  Filled 2023-03-04: qty 5

## 2023-03-04 MED ORDER — EPHEDRINE SULFATE-NACL 50-0.9 MG/10ML-% IV SOSY
PREFILLED_SYRINGE | INTRAVENOUS | Status: DC | PRN
  Administered 2023-03-04: 5 mg via INTRAVENOUS
  Administered 2023-03-04: 10 mg via INTRAVENOUS
  Administered 2023-03-04 (×2): 5 mg via INTRAVENOUS

## 2023-03-04 MED ORDER — PHENYLEPHRINE 80 MCG/ML (10ML) SYRINGE FOR IV PUSH (FOR BLOOD PRESSURE SUPPORT)
PREFILLED_SYRINGE | INTRAVENOUS | Status: AC
  Filled 2023-03-04: qty 10

## 2023-03-04 MED ORDER — ASPIRIN 81 MG PO CHEW
324.0000 mg | CHEWABLE_TABLET | Freq: Once | ORAL | Status: AC
  Administered 2023-03-04: 324 mg via ORAL
  Filled 2023-03-04: qty 4

## 2023-03-04 MED ORDER — ROCURONIUM BROMIDE 10 MG/ML (PF) SYRINGE
PREFILLED_SYRINGE | INTRAVENOUS | Status: AC
  Filled 2023-03-04: qty 30

## 2023-03-04 MED ORDER — VANCOMYCIN HCL IN DEXTROSE 1-5 GM/200ML-% IV SOLN
1000.0000 mg | Freq: Once | INTRAVENOUS | Status: AC
  Administered 2023-03-04: 1000 mg via INTRAVENOUS
  Filled 2023-03-04: qty 200

## 2023-03-04 MED ORDER — NITROGLYCERIN IN D5W 200-5 MCG/ML-% IV SOLN: 0.0000 ug/min | INTRAVENOUS | Status: DC

## 2023-03-04 MED ORDER — PROPOFOL 10 MG/ML IV BOLUS
INTRAVENOUS | Status: AC
  Filled 2023-03-04: qty 20

## 2023-03-04 MED ORDER — PHENYLEPHRINE 80 MCG/ML (10ML) SYRINGE FOR IV PUSH (FOR BLOOD PRESSURE SUPPORT)
PREFILLED_SYRINGE | INTRAVENOUS | Status: DC | PRN
  Administered 2023-03-04: 40 ug via INTRAVENOUS
  Administered 2023-03-04: 80 ug via INTRAVENOUS
  Administered 2023-03-04: 120 ug via INTRAVENOUS
  Administered 2023-03-04: 40 ug via INTRAVENOUS
  Administered 2023-03-04: 80 ug via INTRAVENOUS
  Administered 2023-03-04: 160 ug via INTRAVENOUS
  Administered 2023-03-04: 80 ug via INTRAVENOUS
  Administered 2023-03-04: 40 ug via INTRAVENOUS

## 2023-03-04 MED ORDER — SODIUM CHLORIDE (PF) 0.9 % IJ SOLN: OROMUCOSAL | Status: DC | PRN

## 2023-03-04 MED ORDER — TRAMADOL HCL 50 MG PO TABS
50.0000 mg | ORAL_TABLET | ORAL | Status: DC | PRN
  Administered 2023-03-06: 100 mg via ORAL
  Filled 2023-03-04: qty 2

## 2023-03-04 MED ORDER — PROPOFOL 10 MG/ML IV BOLUS
INTRAVENOUS | Status: DC | PRN
  Administered 2023-03-04 (×2): 20 mg via INTRAVENOUS
  Administered 2023-03-04: 30 mg via INTRAVENOUS
  Administered 2023-03-04 (×4): 20 mg via INTRAVENOUS
  Administered 2023-03-04: 30 mg via INTRAVENOUS
  Administered 2023-03-04: 20 mg via INTRAVENOUS
  Administered 2023-03-04: 120 mg via INTRAVENOUS
  Administered 2023-03-04 (×2): 20 mg via INTRAVENOUS

## 2023-03-04 MED ORDER — DEXMEDETOMIDINE HCL IN NACL 400 MCG/100ML IV SOLN: 0.0000 ug/kg/h | INTRAVENOUS | Status: DC

## 2023-03-04 MED ORDER — PANTOPRAZOLE SODIUM 40 MG IV SOLR
40.0000 mg | Freq: Every day | INTRAVENOUS | Status: AC
  Administered 2023-03-04 – 2023-03-05 (×2): 40 mg via INTRAVENOUS
  Filled 2023-03-04 (×2): qty 10

## 2023-03-04 MED ORDER — SODIUM CHLORIDE 0.9% FLUSH
3.0000 mL | Freq: Two times a day (BID) | INTRAVENOUS | Status: DC
  Administered 2023-03-04: 5 mL via INTRAVENOUS
  Administered 2023-03-04: 3 mL via INTRAVENOUS
  Administered 2023-03-06: 10 mL via INTRAVENOUS

## 2023-03-04 MED ORDER — ALBUMIN HUMAN 5 % IV SOLN: 250.0000 mL | INTRAVENOUS | Status: DC | PRN

## 2023-03-04 MED ORDER — SODIUM CHLORIDE 0.9 % IV SOLN
250.0000 mL | INTRAVENOUS | Status: AC
  Administered 2023-03-05: 250 mL via INTRAVENOUS

## 2023-03-04 MED ORDER — ASPIRIN 81 MG PO CHEW: 324.0000 mg | CHEWABLE_TABLET | Freq: Every day | ORAL | Status: DC

## 2023-03-04 MED ORDER — SODIUM CHLORIDE 0.9% FLUSH
3.0000 mL | Freq: Two times a day (BID) | INTRAVENOUS | Status: DC
  Administered 2023-03-05 – 2023-03-09 (×4): 3 mL via INTRAVENOUS

## 2023-03-04 MED ORDER — CHLORHEXIDINE GLUCONATE 0.12 % MT SOLN
15.0000 mL | OROMUCOSAL | Status: AC
  Administered 2023-03-04: 15 mL via OROMUCOSAL
  Filled 2023-03-04: qty 15

## 2023-03-04 MED ORDER — EPINEPHRINE HCL 5 MG/250ML IV SOLN IN NS: 0.0000 ug/min | INTRAVENOUS | Status: DC

## 2023-03-04 MED ORDER — ACETAMINOPHEN 160 MG/5ML PO SOLN: 1000.0000 mg | Freq: Four times a day (QID) | ORAL | Status: DC

## 2023-03-04 MED ORDER — ACETAMINOPHEN 160 MG/5ML PO SOLN
650.0000 mg | Freq: Once | ORAL | Status: AC
  Administered 2023-03-04: 650 mg via ORAL
  Filled 2023-03-04: qty 20.3

## 2023-03-04 MED ORDER — NICARDIPINE HCL IN NACL 20-0.86 MG/200ML-% IV SOLN
INTRAVENOUS | Status: DC | PRN
  Administered 2023-03-04: 2.5 mg/h via INTRAVENOUS

## 2023-03-04 MED ORDER — PLASMA-LYTE A IV SOLN: INTRAVENOUS | Status: DC | PRN

## 2023-03-04 MED ORDER — ONDANSETRON HCL 4 MG/2ML IJ SOLN
INTRAMUSCULAR | Status: DC | PRN
  Administered 2023-03-04: 4 mg via INTRAVENOUS

## 2023-03-04 MED ORDER — ONDANSETRON HCL 4 MG/2ML IJ SOLN
INTRAMUSCULAR | Status: AC
  Filled 2023-03-04: qty 2

## 2023-03-04 MED ORDER — ALBUMIN HUMAN 5 % IV SOLN
INTRAVENOUS | Status: DC | PRN
Start: 2023-03-04 — End: 2023-03-04

## 2023-03-04 MED ORDER — LACTATED RINGERS IV SOLN: INTRAVENOUS | Status: DC | PRN

## 2023-03-04 MED ORDER — MORPHINE SULFATE (PF) 2 MG/ML IV SOLN
1.0000 mg | INTRAVENOUS | Status: DC | PRN
  Administered 2023-03-04: 4 mg via INTRAVENOUS
  Administered 2023-03-05 – 2023-03-06 (×2): 2 mg via INTRAVENOUS
  Filled 2023-03-04 (×2): qty 1
  Filled 2023-03-04: qty 2

## 2023-03-04 MED ORDER — ASPIRIN 325 MG PO TBEC: 325.0000 mg | DELAYED_RELEASE_TABLET | Freq: Every day | ORAL | Status: DC

## 2023-03-04 MED ORDER — MAGNESIUM SULFATE 4 GM/100ML IV SOLN
4.0000 g | Freq: Once | INTRAVENOUS | Status: AC
  Administered 2023-03-04: 4 g via INTRAVENOUS
  Filled 2023-03-04: qty 100

## 2023-03-04 MED ORDER — SODIUM CHLORIDE 0.9% FLUSH
10.0000 mL | Freq: Two times a day (BID) | INTRAVENOUS | Status: DC
  Administered 2023-03-04 – 2023-03-05 (×3): 10 mL
  Administered 2023-03-05: 30 mL

## 2023-03-04 MED ORDER — PANTOPRAZOLE SODIUM 40 MG PO TBEC
40.0000 mg | DELAYED_RELEASE_TABLET | Freq: Every day | ORAL | Status: DC
  Administered 2023-03-06 – 2023-03-09 (×4): 40 mg via ORAL
  Filled 2023-03-04 (×4): qty 1

## 2023-03-04 MED ORDER — SODIUM CHLORIDE 0.9% FLUSH
3.0000 mL | Freq: Two times a day (BID) | INTRAVENOUS | Status: DC
  Administered 2023-03-04 (×2): 10 mL via INTRAVENOUS
  Administered 2023-03-05: 3 mL via INTRAVENOUS

## 2023-03-04 MED ORDER — CHLORHEXIDINE GLUCONATE CLOTH 2 % EX PADS: 6.0000 | MEDICATED_PAD | Freq: Every day | CUTANEOUS | Status: DC

## 2023-03-04 MED ORDER — CHLORHEXIDINE GLUCONATE CLOTH 2 % EX PADS
6.0000 | MEDICATED_PAD | Freq: Every day | CUTANEOUS | Status: DC
  Administered 2023-03-04 – 2023-03-05 (×2): 6 via TOPICAL

## 2023-03-04 MED ORDER — HEPARIN SODIUM (PORCINE) 1000 UNIT/ML IJ SOLN
INTRAMUSCULAR | Status: DC | PRN
  Administered 2023-03-04: 39000 [IU] via INTRAVENOUS

## 2023-03-04 MED ORDER — FENTANYL CITRATE (PF) 250 MCG/5ML IJ SOLN
INTRAMUSCULAR | Status: DC | PRN
Start: 2023-03-04 — End: 2023-03-04
  Administered 2023-03-04 (×5): 100 ug via INTRAVENOUS
  Administered 2023-03-04: 150 ug via INTRAVENOUS
  Administered 2023-03-04 (×3): 50 ug via INTRAVENOUS
  Administered 2023-03-04: 150 ug via INTRAVENOUS
  Administered 2023-03-04: 100 ug via INTRAVENOUS
  Administered 2023-03-04 (×2): 150 ug via INTRAVENOUS

## 2023-03-04 MED ORDER — BISACODYL 10 MG RE SUPP: 10.0000 mg | Freq: Every day | RECTAL | Status: DC

## 2023-03-04 MED ORDER — SODIUM CHLORIDE 0.9 % IV SOLN: INTRAVENOUS | Status: DC | PRN

## 2023-03-04 MED ORDER — ROCURONIUM BROMIDE 10 MG/ML (PF) SYRINGE
PREFILLED_SYRINGE | INTRAVENOUS | Status: DC | PRN
  Administered 2023-03-04: 100 mg via INTRAVENOUS
  Administered 2023-03-04 (×3): 50 mg via INTRAVENOUS

## 2023-03-04 MED ORDER — DOCUSATE SODIUM 100 MG PO CAPS
200.0000 mg | ORAL_CAPSULE | Freq: Every day | ORAL | Status: DC
  Administered 2023-03-05 – 2023-03-08 (×4): 200 mg via ORAL
  Filled 2023-03-04 (×5): qty 2

## 2023-03-04 MED ORDER — METOCLOPRAMIDE HCL 5 MG/ML IJ SOLN
10.0000 mg | Freq: Four times a day (QID) | INTRAMUSCULAR | Status: AC
  Administered 2023-03-04 – 2023-03-05 (×6): 10 mg via INTRAVENOUS
  Filled 2023-03-04 (×5): qty 2

## 2023-03-04 MED ORDER — MIDAZOLAM HCL (PF) 10 MG/2ML IJ SOLN
INTRAMUSCULAR | Status: AC
  Filled 2023-03-04: qty 2

## 2023-03-04 MED ORDER — BISACODYL 5 MG PO TBEC
10.0000 mg | DELAYED_RELEASE_TABLET | Freq: Every day | ORAL | Status: DC
  Administered 2023-03-05 – 2023-03-07 (×3): 10 mg via ORAL
  Filled 2023-03-04 (×4): qty 2

## 2023-03-04 MED ORDER — SODIUM CHLORIDE 0.9% FLUSH
3.0000 mL | Freq: Two times a day (BID) | INTRAVENOUS | Status: DC
  Administered 2023-03-04 (×2): 10 mL via INTRAVENOUS

## 2023-03-04 MED ORDER — METOPROLOL TARTRATE 5 MG/5ML IV SOLN: 2.5000 mg | INTRAVENOUS | Status: DC | PRN

## 2023-03-04 MED ORDER — OXYCODONE HCL 5 MG PO TABS
5.0000 mg | ORAL_TABLET | ORAL | Status: DC | PRN
  Administered 2023-03-04: 5 mg via ORAL
  Administered 2023-03-04 – 2023-03-06 (×6): 10 mg via ORAL
  Filled 2023-03-04 (×3): qty 2
  Filled 2023-03-04: qty 1
  Filled 2023-03-04 (×3): qty 2

## 2023-03-04 MED ORDER — METOPROLOL TARTRATE 12.5 MG HALF TABLET
12.5000 mg | ORAL_TABLET | Freq: Two times a day (BID) | ORAL | Status: DC
  Filled 2023-03-04: qty 1

## 2023-03-04 SURGICAL SUPPLY — 77 items
BAG DECANTER FOR FLEXI CONT (MISCELLANEOUS) ×2 IMPLANT
BLADE CLIPPER SURG (BLADE) ×2 IMPLANT
BLADE STERNUM SYSTEM 6 (BLADE) ×2 IMPLANT
BNDG ELASTIC 4INX 5YD STR LF (GAUZE/BANDAGES/DRESSINGS) IMPLANT
BNDG ELASTIC 6INX 5YD STR LF (GAUZE/BANDAGES/DRESSINGS) ×2 IMPLANT
BNDG GAUZE DERMACEA FLUFF 4 (GAUZE/BANDAGES/DRESSINGS) ×2 IMPLANT
CABLE SURGICAL S-101-97-12 (CABLE) ×2 IMPLANT
CANNULA MC2 2 STG 29/37 NON-V (CANNULA) ×2 IMPLANT
CANNULA NON VENT 20FR 12 (CANNULA) ×2 IMPLANT
CANNULA NON VENT 22FR 12 (CANNULA) IMPLANT
CATH ROBINSON RED A/P 18FR (CATHETERS) ×4 IMPLANT
CLIP RETRACTION 3.0MM CORONARY (MISCELLANEOUS) IMPLANT
CLIP TI MEDIUM 24 (CLIP) IMPLANT
CLIP TI WIDE RED SMALL 24 (CLIP) IMPLANT
CONN ST 1/2X1/2 BEN (MISCELLANEOUS) ×2 IMPLANT
CONNECTOR BLAKE 2:1 CARIO BLK (MISCELLANEOUS) ×2 IMPLANT
CONTAINER PROTECT SURGISLUSH (MISCELLANEOUS) ×4 IMPLANT
COVER CAMERA OR STL DISP (MISCELLANEOUS) IMPLANT
DERMABOND ADVANCED .7 DNX6 (GAUZE/BANDAGES/DRESSINGS) IMPLANT
DRAIN CHANNEL 19F RND (DRAIN) ×6 IMPLANT
DRAIN CONNECTOR BLAKE 1:1 (MISCELLANEOUS) ×2 IMPLANT
DRAPE INCISE IOBAN 66X45 STRL (DRAPES) IMPLANT
DRAPE SLUSH/WARMER DISC (DRAPES) IMPLANT
DRAPE SRG 135X102X78XABS (DRAPES) ×2 IMPLANT
DRSG AQUACEL AG ADV 3.5X10 (GAUZE/BANDAGES/DRESSINGS) ×2 IMPLANT
ELECT BLADE 4.0 EZ CLEAN MEGAD (MISCELLANEOUS) ×2 IMPLANT
ELECT REM PT RETURN 9FT ADLT (ELECTROSURGICAL) ×4 IMPLANT
ELECTRODE BLDE 4.0 EZ CLN MEGD (MISCELLANEOUS) ×2 IMPLANT
ELECTRODE REM PT RTRN 9FT ADLT (ELECTROSURGICAL) ×4 IMPLANT
FELT TEFLON 1X6 (MISCELLANEOUS) ×4 IMPLANT
GAUZE 4X4 16PLY ~~LOC~~+RFID DBL (SPONGE) ×2 IMPLANT
GAUZE SPONGE 4X4 12PLY STRL (GAUZE/BANDAGES/DRESSINGS) ×4 IMPLANT
GLOVE BIO SURGEON STRL SZ 6.5 (GLOVE) IMPLANT
GLOVE BIO SURGEON STRL SZ7 (GLOVE) ×4 IMPLANT
GLOVE BIOGEL M STRL SZ7.5 (GLOVE) ×4 IMPLANT
GLOVE BIOGEL PI IND STRL 6.5 (GLOVE) IMPLANT
GOWN STRL REUS W/ TWL LRG LVL3 (GOWN DISPOSABLE) ×8 IMPLANT
GOWN STRL REUS W/ TWL XL LVL3 (GOWN DISPOSABLE) ×4 IMPLANT
HEMOSTAT POWDER SURGIFOAM 1G (HEMOSTASIS) ×4 IMPLANT
INSERT SUTURE HOLDER (MISCELLANEOUS) ×2 IMPLANT
KIT BASIN OR (CUSTOM PROCEDURE TRAY) ×2 IMPLANT
KIT SUCTION CATH 14FR (SUCTIONS) ×2 IMPLANT
KIT TURNOVER KIT B (KITS) ×2 IMPLANT
KIT VASOVIEW HEMOPRO 2 VH 4000 (KITS) ×2 IMPLANT
LEAD PACING MYOCARDI (MISCELLANEOUS) ×2 IMPLANT
MARKER GRAFT CORONARY BYPASS (MISCELLANEOUS) ×6 IMPLANT
NS IRRIG 1000ML POUR BTL (IV SOLUTION) ×10 IMPLANT
PACK E OPEN HEART (SUTURE) ×2 IMPLANT
PACK OPEN HEART (CUSTOM PROCEDURE TRAY) ×2 IMPLANT
PAD ARMBOARD 7.5X6 YLW CONV (MISCELLANEOUS) ×4 IMPLANT
PAD ELECT DEFIB RADIOL ZOLL (MISCELLANEOUS) ×2 IMPLANT
PENCIL BUTTON HOLSTER BLD 10FT (ELECTRODE) ×2 IMPLANT
POSITIONER HEAD DONUT 9IN (MISCELLANEOUS) ×2 IMPLANT
PUNCH AORTIC ROTATE 4.0MM (MISCELLANEOUS) ×2 IMPLANT
SET MPS 3-ND DEL (MISCELLANEOUS) IMPLANT
SPONGE T-LAP 18X18 ~~LOC~~+RFID (SPONGE) ×8 IMPLANT
SUPPORT HEART JANKE-BARRON (MISCELLANEOUS) ×2 IMPLANT
SUT BONE WAX W31G (SUTURE) ×2 IMPLANT
SUT ETHIBOND X763 2 0 SH 1 (SUTURE) ×4 IMPLANT
SUT MNCRL AB 3-0 PS2 18 (SUTURE) ×4 IMPLANT
SUT PDS AB 1 CTX 36 (SUTURE) ×4 IMPLANT
SUT PROLENE 4 0 SH DA (SUTURE) ×2 IMPLANT
SUT PROLENE 5 0 C 1 36 (SUTURE) ×6 IMPLANT
SUT PROLENE 7 0 BV 1 (SUTURE) IMPLANT
SUT PROLENE 7 0 BV1 MDA (SUTURE) ×2 IMPLANT
SUT STEEL 6MS V (SUTURE) ×4 IMPLANT
SUT STEEL STERNAL CCS#1 18IN (SUTURE) IMPLANT
SUT VIC AB 2-0 CT1 TAPERPNT 27 (SUTURE) IMPLANT
SYSTEM SAHARA CHEST DRAIN ATS (WOUND CARE) ×2 IMPLANT
TAPE CLOTH SURG 4X10 WHT LF (GAUZE/BANDAGES/DRESSINGS) IMPLANT
TAPE PAPER 2X10 WHT MICROPORE (GAUZE/BANDAGES/DRESSINGS) IMPLANT
TOWEL GREEN STERILE (TOWEL DISPOSABLE) ×2 IMPLANT
TOWEL GREEN STERILE FF (TOWEL DISPOSABLE) ×2 IMPLANT
TRAY FOLEY SLVR 16FR TEMP STAT (SET/KITS/TRAYS/PACK) ×2 IMPLANT
TUBING LAP HI FLOW INSUFFLATIO (TUBING) ×2 IMPLANT
UNDERPAD 30X36 HEAVY ABSORB (UNDERPADS AND DIAPERS) ×2 IMPLANT
WATER STERILE IRR 1000ML POUR (IV SOLUTION) ×4 IMPLANT

## 2023-03-04 NOTE — Hospital Course (Addendum)
 HPI: This 68 year old male with a past medical history of coronary artery disease (STEMI 2018), DM, hyperlipidemia, hypertension, OSA (now on CPAP) who presented to the emergency room with worsening substernal chest pain.  Symptoms began approximately 4 weeks ago.  Over time, the symptoms have progressed in frequency.  The pain is substernal without radiation.  It does worsen with exertion and is relieved with rest.  No previous tobacco abuse.  He ruled in for non-STEMI.  Peak troponin I was 430.  He he underwent cardiac catheterization on today's date and was found to have severe left main as well as critical proximal 95% stenosis of a dominant RCA with total occlusion of the acute marginal branch and mild diffuse distal vessel disease.  Echocardiogram showed LVEF 45-50%, mild MR and AI.   He is currently on heparin drip. Cardiothoracic surgery was asked to evaluate for consideratio of coronary artery bypass grafting surgery. Dr. Cliffton Asters explained possible risks, benefits, and complications of the surgery with the patient and he agreed to proceed with surgery. Pre operative carotid duplex US showed no significant internal carotid artery stenosis bilaterally.  Hospital Course; Patient underwent a CABG x 4. He was transported from the OR to [facility] ICU in stable condition. He was extubated early the evening of surgery. A line and foley were removed on post op day one. Chest tubes remained and once output decreased, were removed on 02/26. He was started on Lopressor. He was transitioned off the Insulin drip. Pre op HGA1C was 6.3. Per patient, he is diet controlled. He had lower extremity edema and was above his pre op weight so he was given daily Lasix. Epicardial pacing wires were removed on post op day one. He was started on Plavix (s/p NSTEMI) and ec asa was decreased to 81 mg daily on 02/25.He was stable for transfer from the ICU to 4E for further convalescence on 02/26. He has been ambulating on room air with  good oxygenation. He has been tolerating a diet and has had a bowel movement. All wounds are clean, dry, healing without signs of infection. Lopressor was titrated for better HR/BP control.
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
## 2023-03-04 NOTE — Anesthesia Procedure Notes (Signed)
 Central Venous Catheter Insertion Performed by: Shelton Silvas, MD, anesthesiologist Start/End2/24/2025 7:10 AM, 03/04/2023 7:20 AM Patient location: Pre-op. Preanesthetic checklist: patient identified, IV checked, site marked, risks and benefits discussed, surgical consent, monitors and equipment checked, pre-op evaluation, timeout performed and anesthesia consent Position: Trendelenburg Lidocaine 1% used for infiltration and patient sedated Hand hygiene performed , maximum sterile barriers used  and Seldinger technique used Catheter size: 8.5 Fr Total catheter length 10. Central line was placed.Sheath introducer Swan type:thermodilution PA Cath depth:50 Procedure performed using ultrasound guided technique. Ultrasound Notes:anatomy identified, needle tip was noted to be adjacent to the nerve/plexus identified, no ultrasound evidence of intravascular and/or intraneural injection and image(s) printed for medical record Attempts: 1 Following insertion, line sutured, dressing applied and Biopatch. Post procedure assessment: blood return through all ports, free fluid flow and no air  Patient tolerated the procedure well with no immediate complications.

## 2023-03-04 NOTE — Brief Op Note (Signed)
 02/26/2023 - 03/04/2023  12:06 PM  PATIENT:  Vincent [name]  67 y.o. male  PRE-OPERATIVE DIAGNOSIS:  CORONARY ARTERY DISEASE  POST-OPERATIVE DIAGNOSIS:  CORONARY ARTERY DISEASE  PROCEDURE:  TRANSESOPHAGEAL ECHOCARDIOGRAM (TEE), CORONARY ARTERY BYPASS GRAFTING X  4 (LIMA to LAD, SVG to DIAGONAL, SVG to OM, and SVG to PDA USING LEFT INTERNAL MAMMARY ARTERY AND ENDOSCOPICALLY HARVESTED RIGHT GREATER SAPHENOUS VEIN   Vein harvest time: 48 min Vein prep time: 16 min  SURGEON:  Surgeons and Role:    Corliss Skains, MD - Primary  PHYSICIAN ASSISTANT: Doree Fudge PA-C  ASSISTANTS: Virgilio Frees RNFA   ANESTHESIA:   general  EBL:  Per perfusion and anesthesia record  DRAINS:  Chest tubes placed in the mediastinal and pleural spaces    COUNTS CORRECT:  YES  DICTATION: .Dragon Dictation  PLAN OF CARE: Admit to inpatient   PATIENT DISPOSITION:  ICU - intubated and hemodynamically stable.   Delay start of Pharmacological VTE agent (>24hrs) due to surgical blood loss or risk of bleeding: no  BASELINE WEIGHT: 97 kg
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
## 2023-03-04 NOTE — Discharge Instructions (Signed)

## 2023-03-04 NOTE — Anesthesia Procedure Notes (Signed)
 Central Venous Catheter Insertion Performed by: Shelton Silvas, MD, anesthesiologist Start/End2/24/2025 7:20 AM, 03/04/2023 7:22 AM Patient location: Pre-op. Preanesthetic checklist: patient identified, IV checked, site marked, risks and benefits discussed, surgical consent, monitors and equipment checked, pre-op evaluation, timeout performed and anesthesia consent Position: Trendelenburg Lidocaine 1% used for infiltration and patient sedated Hand hygiene performed , maximum sterile barriers used  and Seldinger technique used Total catheter length 16. Triple lumen Procedure performed without using ultrasound guided technique. Attempts: 1 Post procedure assessment: blood return through all ports  Patient tolerated the procedure well with no immediate complications. Additional procedure comments: Inserted through the catheter.Marland Kitchen

## 2023-03-04 NOTE — Anesthesia Postprocedure Evaluation (Signed)
 Anesthesia Post Note  Patient: [employer]  Procedure(s) Performed: CORONARY ARTERY BYPASS GRAFTING X 4, USING LEFT INTERNAL MAMMARY ARTERY AND ENDOSCOPICALLY HARVESTED RIGHT GREATER SAPHENOUS VEIN (Chest) TRANSESOPHAGEAL ECHOCARDIOGRAM (TEE)     Patient location during evaluation: SICU Anesthesia Type: General Level of consciousness: sedated Pain management: pain level controlled Vital Signs Assessment: post-procedure vital signs reviewed and stable Respiratory status: patient remains intubated per anesthesia plan Cardiovascular status: stable Postop Assessment: no apparent nausea or vomiting Anesthetic complications: no  No notable events documented.  Last Vitals:  Vitals:   03/03/23 2036 03/04/23 0444  BP: 138/84 133/80  Pulse:    Resp: 17 18  Temp: 37.2 C 36.7 C  SpO2: 97%     Last Pain:  Vitals:   03/04/23 0643  TempSrc:   PainSc: 0-No pain                 Shelton Silvas

## 2023-03-04 NOTE — Procedures (Signed)
 Extubation Procedure Note  Patient Details:   Name: Vincent [name] DOB: 1955-09-03 MRN: [id number]   Airway Documentation:    Vent end date: 03/04/23 Vent end time: 1735   Evaluation  O2 sats: stable throughout Complications: No apparent complications Patient did tolerate procedure well. Bilateral Breath Sounds: Clear, Diminished   Yes Pt extubated per rapid wean protocol. NIF -24 and VC 1L. Pt with positive cuff leak. Upon extubation, pt able to speak name, produce strong cough, no stridor heard, was placed on 3L [place].   Loletha Carrow 03/04/2023, 5:45 PM

## 2023-03-04 NOTE — Transfer of Care (Signed)
 Immediate Anesthesia Transfer of Care Note  Patient: Vincent [name]  Procedure(s) Performed: CORONARY ARTERY BYPASS GRAFTING X 4, USING LEFT INTERNAL MAMMARY ARTERY AND ENDOSCOPICALLY HARVESTED RIGHT GREATER SAPHENOUS VEIN (Chest) TRANSESOPHAGEAL ECHOCARDIOGRAM (TEE)  Patient Location: ICU  Anesthesia Type:General  Level of Consciousness: sedated and Patient remains intubated per anesthesia plan  Airway & Oxygen Therapy: Patient remains intubated per anesthesia plan and Patient placed on Ventilator (see vital sign flow sheet for setting)  Post-op Assessment: Report given to RN and Post -op Vital signs reviewed and stable  Post vital signs: Reviewed and stable  Last Vitals:  Vitals Value Taken Time  BP    Temp 36.1 C 03/04/23 1347  Pulse 62 03/04/23 1347  Resp 16 03/04/23 1347  SpO2 99 % 03/04/23 1347  Vitals shown include unfiled device data.  Last Pain:  Vitals:   03/04/23 0643  TempSrc:   PainSc: 0-No pain      Patients Stated Pain Goal: 0 (03/02/23 2236)  Complications: No notable events documented.

## 2023-03-04 NOTE — Anesthesia Preprocedure Evaluation (Addendum)
 Anesthesia Evaluation  Patient identified by MRN, date of birth, ID band Patient awake    Reviewed: Allergy & Precautions, NPO status , Patient's Chart, lab work & pertinent test results  Airway Mallampati: I  TM Distance: >3 FB Neck ROM: Full    Dental  (+) Missing, Dental Advisory Given,    Pulmonary sleep apnea    breath sounds clear to auscultation       Cardiovascular hypertension, Pt. on medications + Past MI   Rhythm:Regular Rate:Bradycardia  Echo:   1. Left ventricular ejection fraction, by estimation, is 45 to 50%. The  left ventricle has mildly decreased function. The left ventricle  demonstrates regional wall motion abnormalities (see scoring  diagram/findings for description). Left ventricular  diastolic parameters are consistent with Grade I diastolic dysfunction  (impaired relaxation).   2. Right ventricular systolic function is normal. The right ventricular  size is normal.   3. The mitral valve is normal in structure. Mild mitral valve  regurgitation. No evidence of mitral stenosis.   4. The aortic valve is tricuspid. Aortic valve regurgitation is mild. No  aortic stenosis is present. Aortic valve area, by VTI measures 2.14 cm.  Aortic valve mean gradient measures 4.0 mmHg. Aortic valve Vmax measures  1.37 m/s.   5. The inferior vena cava is normal in size with greater than 50%  respiratory variability, suggesting right atrial pressure of 3 mmHg.     Neuro/Psych  Neuromuscular disease  negative psych ROS   GI/Hepatic negative GI ROS, Neg liver ROS,,,  Endo/Other  diabetes    Renal/GU negative Renal ROS     Musculoskeletal negative musculoskeletal ROS (+)    Abdominal   Peds  Hematology negative hematology ROS (+)   Anesthesia Other Findings   Reproductive/Obstetrics                             Anesthesia Physical Anesthesia Plan  ASA: 4  Anesthesia Plan:  General   Post-op Pain Management: Tylenol PO (pre-op)*   Induction: Intravenous  PONV Risk Score and Plan: 3 and Ondansetron and Midazolam  Airway Management Planned: Oral ETT  Additional Equipment: Arterial line, TEE and Ultrasound Guidance Line Placement  Intra-op Plan:   Post-operative Plan: Post-operative intubation/ventilation  Informed Consent: I have reviewed the patients History and Physical, chart, labs and discussed the procedure including the risks, benefits and alternatives for the proposed anesthesia with the patient or authorized representative who has indicated his/her understanding and acceptance.     Dental advisory given  Plan Discussed with: CRNA  Anesthesia Plan Comments:        Anesthesia Quick Evaluation

## 2023-03-04 NOTE — Anesthesia Procedure Notes (Signed)
 Arterial Line Insertion Start/End2/24/2025 7:00 AM, 03/04/2023 7:05 AM Performed by: Randon Goldsmith, CRNA, CRNA  Patient location: Pre-op. Preanesthetic checklist: patient identified, IV checked, site marked, risks and benefits discussed, surgical consent, monitors and equipment checked, pre-op evaluation, timeout performed and anesthesia consent Lidocaine 1% used for infiltration Left, radial was placed Catheter size: 20 G Hand hygiene performed , maximum sterile barriers used  and Seldinger technique used Allen's test indicative of satisfactory collateral circulation Attempts: 1 Procedure performed without using ultrasound guided technique. Following insertion, dressing applied. Post procedure assessment: normal and unchanged  Patient tolerated the procedure well with no immediate complications.

## 2023-03-04 NOTE — Anesthesia Procedure Notes (Signed)
 Procedure Name: Intubation Date/Time: 03/04/2023 8:02 AM  Performed by: Randon Goldsmith, CRNAPre-anesthesia Checklist: Patient identified, Emergency Drugs available, Suction available and Patient being monitored Patient Re-evaluated:Patient Re-evaluated prior to induction Oxygen Delivery Method: Circle system utilized Preoxygenation: Pre-oxygenation with 100% oxygen Induction Type: IV induction Ventilation: Mask ventilation without difficulty Laryngoscope Size: Mac and 4 Grade View: Grade II Tube type: Oral Tube size: 8.0 mm Number of attempts: 1 Airway Equipment and Method: Stylet and Oral airway Placement Confirmation: ETT inserted through vocal cords under direct vision, positive ETCO2 and breath sounds checked- equal and bilateral Secured at: 23 cm Tube secured with: Tape Dental Injury: Teeth and Oropharynx as per pre-operative assessment

## 2023-03-04 NOTE — Progress Notes (Signed)
     [address]       [place] [address]             [phone]       No events  Vitals:   03/03/23 2036 03/04/23 0444  BP: 138/84 133/80  Pulse:    Resp: 17 18  Temp: 98.9 F (37.2 C) 98 F (36.7 C)  SpO2: 97%    Alert NAD Sinus  EWOB  OR today for CABG

## 2023-03-04 NOTE — Op Note (Signed)
 [address]       [name] [address]             [phone]                                          03/04/2023 Patient:  Vincent [name] Pre-Op Dx: NSTEMI 3V CAD HTN DM HLP   Post-op Dx:  same Procedure: CABG X 4.  LIMA LAD, RSVG PDA, OM, diagonal   Endoscopic greater saphenous vein harvest on the right   Surgeon and Role:      * Jedrick Hutcherson, Eliezer Lofts, MD - Primary    * Jacques Earthly , PA-C - assisting An experienced assistant was required given the complexity of this surgery and the standard of surgical care. The assistant was needed for exposure, dissection, suctioning, retraction of delicate tissues and sutures, instrument exchange and for overall help during this procedure.    Anesthesia  general EBL:  Blood Administration: none Xclamp Time:  50 min Pump Time:   Drains: 68 F blake drain:  L, mediastinal  Wires: V Counts: correct   Indications: 68yo male admitted with NSTEMI and Lm/3V CAD. Reduced LV function on echo, but no significant valvular disease. Will plan for CABG 4.   Findings: Calcified PDA, good LIMA, diag, and OM.  Good LIMA and vein  Operative Technique: All invasive lines were placed in pre-op holding.  After the risks, benefits and alternatives were thoroughly discussed, the patient was brought to the operative theatre.  Anesthesia was induced, and the patient was prepped and draped in normal sterile fashion.  An appropriate surgical pause was performed, and pre-operative antibiotics were dosed accordingly.  We began with simultaneous incisions along the right leg for harvesting of the greater saphenous vein and the chest for the sternotomy.  In regards to the sternotomy, this was carried down with bovie cautery, and the sternum was divided with a reciprocating saw.  Meticulous hemostasis was obtained.  The left internal thoracic artery was exposed and harvested in in pedicled fashion.  The patient was systemically  heparinized, and the artery was divided distally, and placed in a papaverine sponge.    The sternal elevator was removed, and a retractor was placed.  The pericardium was divided in the midline and fashioned into a cradle with pericardial stitches.   After we confirmed an appropriate ACT, the ascending aorta was cannulated in standard fashion.  The right atrial appendage was used for venous cannulation site.  Cardiopulmonary bypass was initiated, and the heart retractor was placed. The cross clamp was applied, and a dose of anterograde cardioplegia was given with good arrest of the heart.  We moved to the posterior wall of the heart, and found a good target on the PDA.  An arteriotomy was made, and the vein graft was anastomosed to it in an end to side fashion.  Next we exposed the lateral wall, and found a good target on the OM.  An end to side anastomosis with the vein graft was then created.  Next, we exposed the anterior wall of the heart and identified a good target on diagonal.   An arteriotomy was created.  The vein was anastomosed in an end to side fashion.  Finally, we exposed a good target on the LAD, and fashioned an end to side anastomosis between it and the  LITA.  We began to re-warm, and a re-animation dose of cardioplegia was given.  The heart was de-aired, and the cross clamp was removed.  Meticulous hemostasis was obtained.    A partial occludding clamp was then placed on the ascending aorta, and we created an end to side anastomosis between it and the proximal vein grafts.  Rings were placed on the proximal anastomosis.  Hemostasis was obtained, and we separated from cardiopulmonary bypass without event.  The heparin was reversed with protamine.  Chest tubes and wires were placed, and the sternum was re-approximated with sternal wires.  The soft tissue and skin were re-approximated wth absorbable suture.    The patient tolerated the procedure without any immediate complications, and was  transferred to the ICU in guarded condition.  Vincent [name]

## 2023-03-05 ENCOUNTER — Inpatient Hospital Stay (HOSPITAL_COMMUNITY): Payer: 59

## 2023-03-05 ENCOUNTER — Encounter (HOSPITAL_COMMUNITY): Payer: Self-pay | Admitting: Thoracic Surgery (Cardiothoracic Vascular Surgery)

## 2023-03-05 DIAGNOSIS — Z951 Presence of aortocoronary bypass graft: Secondary | ICD-10-CM | POA: Diagnosis not present

## 2023-03-05 DIAGNOSIS — E1165 Type 2 diabetes mellitus with hyperglycemia: Secondary | ICD-10-CM

## 2023-03-05 DIAGNOSIS — I5021 Acute systolic (congestive) heart failure: Secondary | ICD-10-CM | POA: Diagnosis not present

## 2023-03-05 DIAGNOSIS — D62 Acute posthemorrhagic anemia: Secondary | ICD-10-CM

## 2023-03-05 DIAGNOSIS — I25110 Atherosclerotic heart disease of native coronary artery with unstable angina pectoris: Secondary | ICD-10-CM | POA: Diagnosis not present

## 2023-03-05 LAB — CBC
HCT: 31 % — ABNORMAL LOW (ref 39.0–52.0)
HCT: 30.7 % — ABNORMAL LOW (ref 39.0–52.0)
Hemoglobin: 10.2 g/dL — ABNORMAL LOW (ref 13.0–17.0)
Hemoglobin: 10 g/dL — ABNORMAL LOW (ref 13.0–17.0)
MCH: 28.2 pg (ref 26.0–34.0)
MCH: 28.2 pg (ref 26.0–34.0)
MCHC: 32.9 g/dL (ref 30.0–36.0)
MCHC: 32.6 g/dL (ref 30.0–36.0)
MCV: 85.6 fL (ref 80.0–100.0)
MCV: 86.5 fL (ref 80.0–100.0)
Platelets: 102 10*3/uL — ABNORMAL LOW (ref 150–400)
Platelets: 88 10*3/uL — ABNORMAL LOW (ref 150–400)
RBC: 3.62 MIL/uL — ABNORMAL LOW (ref 4.22–5.81)
RBC: 3.55 MIL/uL — ABNORMAL LOW (ref 4.22–5.81)
RDW: 13.6 % (ref 11.5–15.5)
RDW: 13.7 % (ref 11.5–15.5)
WBC: 10 10*3/uL (ref 4.0–10.5)
WBC: 9.4 10*3/uL (ref 4.0–10.5)
nRBC: 0 % (ref 0.0–0.2)
nRBC: 0 % (ref 0.0–0.2)

## 2023-03-05 LAB — GLUCOSE, CAPILLARY
Glucose-Capillary: 107 mg/dL — ABNORMAL HIGH (ref 70–99)
Glucose-Capillary: 107 mg/dL — ABNORMAL HIGH (ref 70–99)
Glucose-Capillary: 108 mg/dL — ABNORMAL HIGH (ref 70–99)
Glucose-Capillary: 115 mg/dL — ABNORMAL HIGH (ref 70–99)
Glucose-Capillary: 115 mg/dL — ABNORMAL HIGH (ref 70–99)
Glucose-Capillary: 123 mg/dL — ABNORMAL HIGH (ref 70–99)
Glucose-Capillary: 125 mg/dL — ABNORMAL HIGH (ref 70–99)
Glucose-Capillary: 126 mg/dL — ABNORMAL HIGH (ref 70–99)
Glucose-Capillary: 127 mg/dL — ABNORMAL HIGH (ref 70–99)
Glucose-Capillary: 133 mg/dL — ABNORMAL HIGH (ref 70–99)
Glucose-Capillary: 133 mg/dL — ABNORMAL HIGH (ref 70–99)
Glucose-Capillary: 144 mg/dL — ABNORMAL HIGH (ref 70–99)

## 2023-03-05 LAB — BASIC METABOLIC PANEL
Anion gap: 8 (ref 5–15)
Anion gap: 9 (ref 5–15)
BUN: 14 mg/dL (ref 8–23)
BUN: 17 mg/dL (ref 8–23)
CO2: 23 mmol/L (ref 22–32)
CO2: 25 mmol/L (ref 22–32)
Calcium: 8 mg/dL — ABNORMAL LOW (ref 8.9–10.3)
Calcium: 8.4 mg/dL — ABNORMAL LOW (ref 8.9–10.3)
Chloride: 106 mmol/L (ref 98–111)
Chloride: 101 mmol/L (ref 98–111)
Creatinine, Ser: 0.88 mg/dL (ref 0.61–1.24)
Creatinine, Ser: 1.02 mg/dL (ref 0.61–1.24)
GFR, Estimated: >60 mL/min (ref >60)
GFR, Estimated: >60 mL/min (ref >60)
Glucose, Bld: 119 mg/dL — ABNORMAL HIGH (ref 70–99)
Glucose, Bld: 128 mg/dL — ABNORMAL HIGH (ref 70–99)
Potassium: 4.5 mmol/L (ref 3.5–5.1)
Potassium: 4.2 mmol/L (ref 3.5–5.1)
Sodium: 137 mmol/L (ref 135–145)
Sodium: 135 mmol/L (ref 135–145)

## 2023-03-05 LAB — MAGNESIUM
Magnesium: 2.1 mg/dL (ref 1.7–2.4)
Magnesium: 2 mg/dL (ref 1.7–2.4)

## 2023-03-05 MED ORDER — INSULIN ASPART 100 UNIT/ML IJ SOLN
0.0000 [IU] | INTRAMUSCULAR | Status: DC
Start: 2023-03-05 — End: 2023-03-06
  Administered 2023-03-05 – 2023-03-06 (×5): 2 [IU] via SUBCUTANEOUS

## 2023-03-05 MED ORDER — CLOPIDOGREL BISULFATE 75 MG PO TABS
75.0000 mg | ORAL_TABLET | Freq: Every day | ORAL | Status: DC
  Administered 2023-03-05 – 2023-03-09 (×5): 75 mg via ORAL
  Filled 2023-03-05 (×5): qty 1

## 2023-03-05 MED ORDER — FUROSEMIDE 10 MG/ML IJ SOLN
40.0000 mg | Freq: Once | INTRAMUSCULAR | Status: AC
  Administered 2023-03-05: 40 mg via INTRAVENOUS
  Filled 2023-03-05: qty 4

## 2023-03-05 MED ORDER — METOPROLOL TARTRATE 25 MG PO TABS
25.0000 mg | ORAL_TABLET | Freq: Two times a day (BID) | ORAL | Status: DC
  Administered 2023-03-05 – 2023-03-06 (×4): 25 mg via ORAL
  Filled 2023-03-05 (×4): qty 1

## 2023-03-05 MED ORDER — ASPIRIN 81 MG PO TBEC
81.0000 mg | DELAYED_RELEASE_TABLET | Freq: Every day | ORAL | Status: DC
  Administered 2023-03-05 – 2023-03-09 (×5): 81 mg via ORAL
  Filled 2023-03-05 (×5): qty 1

## 2023-03-05 MED FILL — Heparin Sodium (Porcine) Inj 1000 Unit/ML: INTRAMUSCULAR | Qty: 20 | Status: AC

## 2023-03-05 MED FILL — Sodium Chloride IV Soln 0.9%: INTRAVENOUS | Qty: 2000 | Status: AC

## 2023-03-05 MED FILL — Calcium Chloride Inj 10%: INTRAVENOUS | Qty: 10 | Status: AC

## 2023-03-05 MED FILL — Electrolyte-R (PH 7.4) Solution: INTRAVENOUS | Qty: 3000 | Status: AC

## 2023-03-05 MED FILL — Sodium Bicarbonate IV Soln 8.4%: INTRAVENOUS | Qty: 50 | Status: AC

## 2023-03-05 NOTE — Plan of Care (Signed)
  Problem: Education: Goal: Understanding of cardiac disease, CV risk reduction, and recovery process will improve Outcome: Progressing Goal: Individualized Educational Video(s) Outcome: Progressing   Problem: Activity: Goal: Ability to tolerate increased activity will improve Outcome: Progressing   Problem: Cardiac: Goal: Ability to achieve and maintain adequate cardiovascular perfusion will improve Outcome: Progressing   Problem: Health Behavior/Discharge Planning: Goal: Ability to safely manage health-related needs after discharge will improve Outcome: Progressing   Problem: Education: Goal: Knowledge of General Education information will improve Description: Including pain rating scale, medication(s)/side effects and non-pharmacologic comfort measures Outcome: Progressing   Problem: Health Behavior/Discharge Planning: Goal: Ability to manage health-related needs will improve Outcome: Progressing   Problem: Clinical Measurements: Goal: Ability to maintain clinical measurements within normal limits will improve Outcome: Progressing Goal: Will remain free from infection Outcome: Progressing Goal: Diagnostic test results will improve Outcome: Progressing Goal: Respiratory complications will improve Outcome: Progressing Goal: Cardiovascular complication will be avoided Outcome: Progressing   Problem: Coping: Goal: Level of anxiety will decrease Outcome: Progressing   Problem: Skin Integrity: Goal: Risk for impaired skin integrity will decrease Outcome: Progressing   Problem: Education: Goal: Understanding of CV disease, CV risk reduction, and recovery process will improve Outcome: Progressing Goal: Individualized Educational Video(s) Outcome: Progressing   Problem: Health Behavior/Discharge Planning: Goal: Ability to safely manage health-related needs after discharge will improve Outcome: Progressing   Problem: Education: Goal: Will demonstrate proper wound  care and an understanding of methods to prevent future damage Outcome: Progressing Goal: Knowledge of disease or condition will improve Outcome: Progressing Goal: Knowledge of the prescribed therapeutic regimen will improve Outcome: Progressing Goal: Individualized Educational Video(s) Outcome: Progressing   Problem: Activity: Goal: Risk for activity intolerance will decrease Outcome: Progressing   Problem: Cardiac: Goal: Will achieve and/or maintain hemodynamic stability Outcome: Progressing   Problem: Clinical Measurements: Goal: Postoperative complications will be avoided or minimized Outcome: Progressing   Problem: Respiratory: Goal: Respiratory status will improve Outcome: Progressing   Problem: Skin Integrity: Goal: Wound healing without signs and symptoms of infection Outcome: Progressing Goal: Risk for impaired skin integrity will decrease Outcome: Progressing   Problem: Urinary Elimination: Goal: Ability to achieve and maintain adequate renal perfusion and functioning will improve Outcome: Progressing   Problem: Safety: Goal: Non-violent Restraint(s) Outcome: Progressing

## 2023-03-05 NOTE — Progress Notes (Signed)
      [address]       [place] [address]             [phone]      POD # 1 CABG x 4  Resting comfortably  BP 125/68   Pulse 90   Temp 98.9 F (37.2 C) (Oral)   Resp (!) 27   Ht 6' (1.829 m)   Wt 103 kg   SpO2 96%   BMI 30.79 kg/m    Intake/Output Summary (Last 24 hours) at 03/05/2023 1958 Last data filed at 03/05/2023 1500 Gross per 24 hour  Intake 1201.4 ml  Output 2065 ml  Net -863.6 ml   K 4.2, creatinine 1.02 Hct 31 PLT 88K down from 102K  Stable POD # 1  Vincent [name] C. Dorris Fetch, MD [facility] [phone]
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
## 2023-03-05 NOTE — Progress Notes (Addendum)
 [facility] DAILY ICU PROGRESS NOTE                   [address]            [name] [address]          [phone]   1 Day Post-Op Procedure(s) (LRB): CORONARY ARTERY BYPASS GRAFTING X 4, USING LEFT INTERNAL MAMMARY ARTERY AND ENDOSCOPICALLY HARVESTED RIGHT GREATER SAPHENOUS VEIN (N/A) TRANSESOPHAGEAL ECHOCARDIOGRAM (TEE) (N/A)  Total Length of Stay:  LOS: 7 days   Subjective: Patient had nausea earlier. He has back pain this am and thinks it is from the chair. Girlfriend is at bedside.  Objective: Vital signs in last 24 hours: Temp:  [96.4 F (35.8 C)-100.2 F (37.9 C)] 99.9 F (37.7 C) (02/25 0645) Pulse Rate:  [44-94] 91 (02/25 0645) Cardiac Rhythm: Normal sinus rhythm (02/24 2000) Resp:  [13-30] 24 (02/25 0645) BP: (104-122)/(52-67) 104/52 (02/24 1605) SpO2:  [91 %-100 %] 92 % (02/25 0645) Arterial Line BP: (87-147)/(38-63) 146/56 (02/25 0645) FiO2 (%):  [40 %-50 %] 40 % (02/24 1710) Weight:  [103 kg] 103 kg (02/25 0500)  Filed Weights   02/26/23 1829 02/27/23 1330 03/05/23 0500  Weight: 99.7 kg 97.4 kg 103 kg    Weight change:    Hemodynamic parameters for last 24 hours: CO:  [3.9 L/min-9.6 L/min] 6.7 L/min CI:  [1.8 L/min/m2-4.3 L/min/m2] 3 L/min/m2  Intake/Output from previous day: 02/24 0701 - 02/25 0700 In: 5186.3 [P.O.:200; I.V.:3097.3; Blood:232; IV Piggyback:1657] Out: 2907 [Urine:1880; Blood:417; Chest Tube:610]  Intake/Output this shift: No intake/output data recorded.  Current Meds: Scheduled Meds:  acetaminophen  1,000 mg Oral Q6H   Or   acetaminophen (TYLENOL) oral liquid 160 mg/5 mL  1,000 mg Per Tube Q6H   aspirin EC  325 mg Oral Daily   Or   aspirin  324 mg Per Tube Daily   atorvastatin  80 mg Oral QPM   bisacodyl  10 mg Oral Daily   Or   bisacodyl  10 mg Rectal Daily   Chlorhexidine Gluconate Cloth  6 each Topical Daily   Chlorhexidine Gluconate Cloth  6 each Topical Daily   docusate sodium  200 mg Oral Daily    metoCLOPramide (REGLAN) injection  10 mg Intravenous Q6H   metoprolol tartrate  12.5 mg Oral BID   Or   metoprolol tartrate  12.5 mg Per Tube BID   [START ON 03/06/2023] pantoprazole  40 mg Oral Daily   pantoprazole (PROTONIX) IV  40 mg Intravenous QHS   sodium chloride flush  10-40 mL Intracatheter Q12H   sodium chloride flush  3 mL Intravenous Q12H   sodium chloride flush  3-10 mL Intravenous Q12H   sodium chloride flush  3-10 mL Intravenous Q12H   sodium chloride flush  3-10 mL Intravenous Q12H   sodium chloride flush  3-10 mL Intravenous Q12H   tamsulosin  0.4 mg Oral Daily   Continuous Infusions:  sodium chloride     albumin human      ceFAZolin (ANCEF) IV 200 mL/hr at 03/05/23 0600   dexmedetomidine (PRECEDEX) IV infusion Stopped (03/05/23 0405)   epinephrine Stopped (03/04/23 1419)   insulin 1 Units/hr (03/05/23 0600)   niCARDipine Stopped (03/04/23 1809)   nitroGLYCERIN Stopped (03/04/23 1419)   norepinephrine (LEVOPHED) Adult infusion Stopped (03/05/23 0542)   PRN Meds:.albumin human, dextrose, metoprolol tartrate, midazolam, morphine injection, ondansetron (ZOFRAN) IV, oxyCODONE, sodium chloride flush, sodium chloride flush, sodium chloride flush, sodium chloride flush, sodium  chloride flush, sodium chloride flush, traMADol  General appearance: alert, cooperative, and no distress Neurologic: intact Heart: RRR, murmur Lungs: Diminished bibasilar breath sounds Abdomen: Soft, non tender, sporadic bowel sounds present Extremities: Mild LE edema Wound: Aquacel intact. RLE wounds are clean and dry  Lab Results: CBC: Recent Labs    03/04/23 2001 03/05/23 0442  WBC 9.2 10.0  HGB 10.8* 10.2*  HCT 33.0* 31.0*  PLT 96* 102*   BMET:  Recent Labs    03/04/23 2001 03/05/23 0442  NA 139 137  K 4.8 4.5  CL 106 106  CO2 24 23  GLUCOSE 124* 119*  BUN 14 14  CREATININE 0.96 0.88  CALCIUM 8.3* 8.0*    CMET: Lab Results  Component Value Date   WBC 10.0 03/05/2023    HGB 10.2 (L) 03/05/2023   HCT 31.0 (L) 03/05/2023   PLT 102 (L) 03/05/2023   GLUCOSE 119 (H) 03/05/2023   CHOL 123 02/27/2023   TRIG 49 02/27/2023   HDL 47 02/27/2023   LDLDIRECT 136.0 10/10/2021   LDLCALC 66 02/27/2023   ALT 20 02/27/2023   AST 23 02/27/2023   NA 137 03/05/2023   K 4.5 03/05/2023   CL 106 03/05/2023   CREATININE 0.88 03/05/2023   BUN 14 03/05/2023   CO2 23 03/05/2023   TSH 1.323 02/27/2023   PSA 4.80 (H) 06/13/2022   INR 1.4 (H) 03/04/2023   HGBA1C 6.3 (H) 02/27/2023   MICROALBUR 1.7 10/10/2021      PT/INR:  Recent Labs    03/04/23 1347  LABPROT 17.7*  INR 1.4*   Radiology: DG Chest Port 1 View Result Date: 03/04/2023 CLINICAL DATA:  Postop day 0 status post CABG EXAM: PORTABLE CHEST 1 VIEW COMPARISON:  02/26/2023 FINDINGS: Endotracheal tube tip 3.4 cm above the carina. Right IJ central line tip projects over the SVC. Mediastinal drain and left basilar chest tubes noted. No significant pneumothorax currently observed. Low lung volumes are present, causing crowding of the pulmonary vasculature. Obscuration left hemidiaphragm compatible with left lower lobe airspace 0 CT passage E, most likely atelectasis. Bandlike subsegmental atelectasis at the right lung base. Moderate degenerative glenohumeral arthropathy bilaterally. IMPRESSION: 1. Satisfactory positioning of the support apparatus. 2. Low lung volumes with left lower lobe airspace opacity, most likely atelectasis. 3. Bandlike subsegmental atelectasis at the right lung base. Electronically Signed   By: Gaylyn Rong M.D.   On: 03/04/2023 16:22   ECHO INTRAOPERATIVE TEE Result Date: 03/04/2023  *INTRAOPERATIVE TRANSESOPHAGEAL REPORT *  Patient Name:   Vincent [name]  Date of Exam: 03/04/2023 Medical Rec #:  [id number]      Height:       72.0 in Accession #:    [id number]     Weight:       214.8 lb Date of Birth:  July 25, 1955       BSA:          2.20 m Patient Age:    67 years       BP:           130/75 mmHg  Patient Gender: M              HR:           77 bpm. Exam Location:  Anesthesiology Transesophogeal exam was perform intraoperatively during surgical procedure. Patient was closely monitored under general anesthesia during the entirety of examination. Indications:     CABG Performing Phys: [id number] Eliezer Lofts Maisey Deandrade Diagnosing Phys: Shona Simpson MD Complications: No  known complications during this procedure. POST-OP IMPRESSIONS _ Left Ventricle: LVEF 45-50%. No significant changes to previous wall motion abnormalities. _ Right Ventricle: The right ventricle appears unchanged from pre-bypass. _ Aorta: No dissection noted after cannula removed. _ Left Atrium: The left atrium appears unchanged from pre-bypass. _ Left Atrial Appendage: The left atrial appendage appears unchanged from pre-bypass. _ Aortic Valve: The aortic valve appears unchanged from pre-bypass. _ Mitral Valve: The mitral valve appears unchanged from pre-bypass. _ Tricuspid Valve: The tricuspid valve appears unchanged from pre-bypass. _ Pulmonic Valve: The pulmonic valve appears unchanged from pre-bypass. _ Interatrial Septum: The interatrial septum appears unchanged from pre-bypass. _ Interventricular Septum: The interventricular septum appears unchanged from pre-bypass. _ Pericardium: The pericardium appears unchanged from pre-bypass. PRE-OP FINDINGS  Left Ventricle: The left ventricle has mild-moderately reduced systolic function, with an ejection fraction of 40-45%. The cavity size was normal. There is no increase in left ventricular wall thickness. There is no left ventricular hypertrophy. Left ventricular diastolic function could was not evaluated. Mild to moderate hypokiness of the septal, inferior and lateral wall. Right Ventricle: The right ventricle has normal systolic function. The cavity was normal. There is no increase in right ventricular wall thickness. There is no aneurysm seen. Left Atrium: Left atrial size was normal in size. No left  atrial/left atrial appendage thrombus was detected. Left atrial appendage velocity is normal at greater than 40 cm/s. Right Atrium: Right atrial size was normal in size. Interatrial Septum: No atrial level shunt detected by color flow Doppler. There is no evidence of a patent foramen ovale. Pericardium: There is no evidence of pericardial effusion. There is no pleural effusion. Mitral Valve: The mitral valve is normal in structure. Mitral valve regurgitation is mild by color flow Doppler. The MR jet is centrally-directed. There is no evidence of mitral valve vegetation. There is no evidence of mitral stenosis. Tricuspid Valve: The tricuspid valve was normal in structure. Tricuspid valve regurgitation is trivial by color flow Doppler. There is no evidence of tricuspid valve vegetation. Aortic Valve: The aortic valve is tricuspid, minimal calcification with normal leaflet motion. Aortic valve regurgitation is mild by color flow Doppler. There is no stenosis of the aortic valve. There is no evidence of aortic valve vegetation. Pulmonic Valve: The pulmonic valve was normal in structure. Pulmonic valve regurgitation is not visualized by color flow Doppler. Aorta: The aortic root, ascending aorta and aortic arch are normal in size and structure. Pulmonary Artery: The pulmonary artery is of normal size. Shunts: There is no evidence of an atrial septal defect.  Shona Simpson MD Electronically signed by Shona Simpson MD Signature Date/Time: 03/04/2023/1:49:52 PM    Final      Assessment/Plan: S/P Procedure(s) (LRB): CORONARY ARTERY BYPASS GRAFTING X 4, USING LEFT INTERNAL MAMMARY ARTERY AND ENDOSCOPICALLY HARVESTED RIGHT GREATER SAPHENOUS VEIN (N/A) TRANSESOPHAGEAL ECHOCARDIOGRAM (TEE) (N/A) CV-CO/CI 8.4/3.8. SR. On Lopressor 12.5 mg bid Pulmonary-on 3 liters of oxygen via [place]. Wean as able. Chest tubes with 610 cc since surgery. CXR this am appears to show low ling volumes,cardiomegaly, bibasilar atelectasis and small  pleural effusions. Encourage incentive spirometer and flutter valve. Above pre op weight, LE edema-likely begin diuresis in am Expected post op blood loss anemia-H and H this am 10.2 and 31. DM-CBGs 107/123/126. Pre op HGA1C 6.3. Transitioning off Insulin drip 6. Mild thrombocytopenia-platelets this am 102,000  Ardelle Balls PA-C 03/05/2023 7:04 AM  Agree with above POD 1 progression  Dominiqua Cooner O Eliah Ozawa
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
## 2023-03-05 NOTE — Progress Notes (Signed)
 Pt stated he wear CPAP at home but not every night. Talked to pt about wearing hospital CPAP, pt stated he didn't want to wear CPAP for the night. RN made aware

## 2023-03-06 ENCOUNTER — Inpatient Hospital Stay (HOSPITAL_COMMUNITY): Payer: 59

## 2023-03-06 DIAGNOSIS — E1165 Type 2 diabetes mellitus with hyperglycemia: Secondary | ICD-10-CM | POA: Diagnosis not present

## 2023-03-06 DIAGNOSIS — D62 Acute posthemorrhagic anemia: Secondary | ICD-10-CM | POA: Diagnosis not present

## 2023-03-06 DIAGNOSIS — D696 Thrombocytopenia, unspecified: Secondary | ICD-10-CM

## 2023-03-06 DIAGNOSIS — I25110 Atherosclerotic heart disease of native coronary artery with unstable angina pectoris: Secondary | ICD-10-CM | POA: Diagnosis not present

## 2023-03-06 DIAGNOSIS — Z951 Presence of aortocoronary bypass graft: Secondary | ICD-10-CM | POA: Diagnosis not present

## 2023-03-06 HISTORY — DX: Thrombocytopenia, unspecified: D69.6

## 2023-03-06 LAB — GLUCOSE, CAPILLARY
Glucose-Capillary: 115 mg/dL — ABNORMAL HIGH (ref 70–99)
Glucose-Capillary: 131 mg/dL — ABNORMAL HIGH (ref 70–99)
Glucose-Capillary: 137 mg/dL — ABNORMAL HIGH (ref 70–99)
Glucose-Capillary: 139 mg/dL — ABNORMAL HIGH (ref 70–99)
Glucose-Capillary: 156 mg/dL — ABNORMAL HIGH (ref 70–99)

## 2023-03-06 LAB — BASIC METABOLIC PANEL
Anion gap: 6 (ref 5–15)
BUN: 17 mg/dL (ref 8–23)
CO2: 29 mmol/L (ref 22–32)
Calcium: 8.1 mg/dL — ABNORMAL LOW (ref 8.9–10.3)
Chloride: 101 mmol/L (ref 98–111)
Creatinine, Ser: 0.95 mg/dL (ref 0.61–1.24)
GFR, Estimated: >60 mL/min (ref >60)
Glucose, Bld: 131 mg/dL — ABNORMAL HIGH (ref 70–99)
Potassium: 4.2 mmol/L (ref 3.5–5.1)
Sodium: 136 mmol/L (ref 135–145)

## 2023-03-06 LAB — CBC
HCT: 29.8 % — ABNORMAL LOW (ref 39.0–52.0)
Hemoglobin: 9.8 g/dL — ABNORMAL LOW (ref 13.0–17.0)
MCH: 28.6 pg (ref 26.0–34.0)
MCHC: 32.9 g/dL (ref 30.0–36.0)
MCV: 86.9 fL (ref 80.0–100.0)
Platelets: 89 10*3/uL — ABNORMAL LOW (ref 150–400)
RBC: 3.43 MIL/uL — ABNORMAL LOW (ref 4.22–5.81)
RDW: 13.9 % (ref 11.5–15.5)
WBC: 9.7 10*3/uL (ref 4.0–10.5)
nRBC: 0 % (ref 0.0–0.2)

## 2023-03-06 MED ORDER — SODIUM CHLORIDE 0.9% FLUSH: 3.0000 mL | INTRAVENOUS | Status: DC | PRN

## 2023-03-06 MED ORDER — SODIUM CHLORIDE 0.9 % IV SOLN: 250.0000 mL | INTRAVENOUS | Status: AC | PRN

## 2023-03-06 MED ORDER — POTASSIUM CHLORIDE CRYS ER 20 MEQ PO TBCR
20.0000 meq | EXTENDED_RELEASE_TABLET | Freq: Every day | ORAL | Status: DC
  Administered 2023-03-06 – 2023-03-09 (×4): 20 meq via ORAL
  Filled 2023-03-06 (×4): qty 1

## 2023-03-06 MED ORDER — ~~LOC~~ CARDIAC SURGERY, PATIENT & FAMILY EDUCATION: Freq: Once | Status: AC

## 2023-03-06 MED ORDER — SODIUM CHLORIDE 0.9% FLUSH
3.0000 mL | Freq: Two times a day (BID) | INTRAVENOUS | Status: DC
  Administered 2023-03-06 – 2023-03-09 (×7): 3 mL via INTRAVENOUS

## 2023-03-06 MED ORDER — FUROSEMIDE 40 MG PO TABS
40.0000 mg | ORAL_TABLET | Freq: Every day | ORAL | Status: DC
  Administered 2023-03-06 – 2023-03-09 (×4): 40 mg via ORAL
  Filled 2023-03-06 (×4): qty 1

## 2023-03-06 MED ORDER — INSULIN ASPART 100 UNIT/ML IJ SOLN
0.0000 [IU] | Freq: Four times a day (QID) | INTRAMUSCULAR | Status: DC
  Administered 2023-03-06 – 2023-03-08 (×4): 2 [IU] via SUBCUTANEOUS

## 2023-03-06 MED ORDER — ORAL CARE MOUTH RINSE: 15.0000 mL | OROMUCOSAL | Status: DC | PRN

## 2023-03-06 MED FILL — Lidocaine HCl Local Preservative Free (PF) Inj 2%: INTRAMUSCULAR | Qty: 14 | Status: AC

## 2023-03-06 MED FILL — Potassium Chloride Inj 2 mEq/ML: INTRAVENOUS | Qty: 40 | Status: AC

## 2023-03-06 MED FILL — Heparin Sodium (Porcine) Inj 1000 Unit/ML: Qty: 1000 | Status: AC

## 2023-03-06 NOTE — Plan of Care (Signed)
  Problem: Cardiac: Goal: Ability to achieve and maintain adequate cardiovascular perfusion will improve Outcome: Progressing   Problem: Clinical Measurements: Goal: Ability to maintain clinical measurements within normal limits will improve Outcome: Progressing Goal: Will remain free from infection Outcome: Progressing Goal: Diagnostic test results will improve Outcome: Progressing Goal: Respiratory complications will improve Outcome: Progressing Goal: Cardiovascular complication will be avoided Outcome: Progressing   Problem: Skin Integrity: Goal: Risk for impaired skin integrity will decrease Outcome: Progressing   Problem: Activity: Goal: Risk for activity intolerance will decrease Outcome: Progressing   Problem: Cardiac: Goal: Will achieve and/or maintain hemodynamic stability Outcome: Progressing   Problem: Clinical Measurements: Goal: Postoperative complications will be avoided or minimized Outcome: Progressing   Problem: Urinary Elimination: Goal: Ability to achieve and maintain adequate renal perfusion and functioning will improve Outcome: Progressing

## 2023-03-06 NOTE — Progress Notes (Addendum)
 TCTS DAILY ICU PROGRESS NOTE                   [address]            [place] [address]          [phone]   2 Days Post-Op Procedure(s) (LRB): CORONARY ARTERY BYPASS GRAFTING X 4, USING LEFT INTERNAL MAMMARY ARTERY AND ENDOSCOPICALLY HARVESTED RIGHT GREATER SAPHENOUS VEIN (N/A) TRANSESOPHAGEAL ECHOCARDIOGRAM (TEE) (N/A)  Total Length of Stay:  LOS: 8 days   Subjective: Patient has already walked this am. He is lying in chair without complaints this am.  Objective: Vital signs in last 24 hours: Temp:  [98.7 F (37.1 C)-100 F (37.8 C)] 99.7 F (37.6 C) (02/26 0300) Pulse Rate:  [76-93] 80 (02/26 0700) Cardiac Rhythm: Normal sinus rhythm (02/25 2138) Resp:  [14-27] 16 (02/26 0700) BP: (99-130)/(59-73) 120/68 (02/26 0700) SpO2:  [90 %-98 %] 93 % (02/26 0700) Arterial Line BP: (129-138)/(45-48) 138/47 (02/25 1000) Weight:  [100.9 kg] 100.9 kg (02/26 0500)  Filed Weights   02/27/23 1330 03/05/23 0500 03/06/23 0500  Weight: 97.4 kg 103 kg 100.9 kg    Weight change: -2.066 kg   Hemodynamic parameters for last 24 hours: CO:  [7.9 L/min-8.4 L/min] 8.3 L/min CI:  [3.6 L/min/m2-3.8 L/min/m2] 3.8 L/min/m2  Intake/Output from previous day: 02/25 0701 - 02/26 0700 In: 920.2 [P.O.:700; I.V.:34.1; IV Piggyback:186.1] Out: 2095 [Urine:1865; Chest Tube:230]  Intake/Output this shift: No intake/output data recorded.  Current Meds: Scheduled Meds:  acetaminophen  1,000 mg Oral Q6H   Or   acetaminophen (TYLENOL) oral liquid 160 mg/5 mL  1,000 mg Per Tube Q6H   aspirin EC  81 mg Oral Daily   atorvastatin  80 mg Oral QPM   bisacodyl  10 mg Oral Daily   Or   bisacodyl  10 mg Rectal Daily   Chlorhexidine Gluconate Cloth  6 each Topical Daily   Chlorhexidine Gluconate Cloth  6 each Topical Daily   clopidogrel  75 mg Oral Daily   docusate sodium  200 mg Oral Daily   insulin aspart  0-24 Units Subcutaneous Q4H   metoprolol tartrate  25 mg Oral BID    pantoprazole  40 mg Oral Daily   sodium chloride flush  10-40 mL Intracatheter Q12H   sodium chloride flush  3 mL Intravenous Q12H   sodium chloride flush  3-10 mL Intravenous Q12H   sodium chloride flush  3-10 mL Intravenous Q12H   sodium chloride flush  3-10 mL Intravenous Q12H   sodium chloride flush  3-10 mL Intravenous Q12H   tamsulosin  0.4 mg Oral Daily   Continuous Infusions:  albumin human     insulin Stopped (03/05/23 0704)   PRN Meds:.albumin human, dextrose, metoprolol tartrate, morphine injection, ondansetron (ZOFRAN) IV, mouth rinse, oxyCODONE, sodium chloride flush, sodium chloride flush, sodium chloride flush, sodium chloride flush, sodium chloride flush, sodium chloride flush, traMADol  General appearance: alert, cooperative, and no distress Neurologic: intact Heart: RRR, murmur Lungs: Diminished bibasilar breath sounds Abdomen: Soft, non tender, sporadic bowel sounds present Extremities: Mild LE edema Wound: Aquacel intact. RLE wounds are clean and dry  Lab Results: CBC: Recent Labs    03/05/23 1644 03/06/23 0450  WBC 9.4 9.7  HGB 10.0* 9.8*  HCT 30.7* 29.8*  PLT 88* 89*   BMET:  Recent Labs    03/05/23 1643 03/06/23 0450  NA 135 136  K 4.2 4.2  CL 101 101  CO2  25 29  GLUCOSE 128* 131*  BUN 17 17  CREATININE 1.02 0.95  CALCIUM 8.4* 8.1*    CMET: Lab Results  Component Value Date   WBC 9.7 03/06/2023   HGB 9.8 (L) 03/06/2023   HCT 29.8 (L) 03/06/2023   PLT 89 (L) 03/06/2023   GLUCOSE 131 (H) 03/06/2023   CHOL 123 02/27/2023   TRIG 49 02/27/2023   HDL 47 02/27/2023   LDLDIRECT 136.0 10/10/2021   LDLCALC 66 02/27/2023   ALT 20 02/27/2023   AST 23 02/27/2023   NA 136 03/06/2023   K 4.2 03/06/2023   CL 101 03/06/2023   CREATININE 0.95 03/06/2023   BUN 17 03/06/2023   CO2 29 03/06/2023   TSH 1.323 02/27/2023   PSA 4.80 (H) 06/13/2022   INR 1.4 (H) 03/04/2023   HGBA1C 6.3 (H) 02/27/2023   MICROALBUR 1.7 10/10/2021      PT/INR:   Recent Labs    03/04/23 1347  LABPROT 17.7*  INR 1.4*   Radiology: No results found.    Assessment/Plan: S/P Procedure(s) (LRB): CORONARY ARTERY BYPASS GRAFTING X 4, USING LEFT INTERNAL MAMMARY ARTERY AND ENDOSCOPICALLY HARVESTED RIGHT GREATER SAPHENOUS VEIN (N/A) TRANSESOPHAGEAL ECHOCARDIOGRAM (TEE) (N/A) CV- S/[ NSTEMI. SR. On Lopressor 25 mg bid and Plavix Pulmonary-on room air. Chest tubes with 230 cc since surgery. CXR this am appears to show low ling volumes,cardiomegaly, bibasilar atelectasis and small pleural effusions. Remove chest tubes.Encourage incentive spirometer and flutter valve. Above pre op weight, LE edema-will give daily Lasix Expected post op blood loss anemia-H and H this am 9.8 and 29.8 DM-CBGs 115/133/139. Pre op HGA1C 6.3. Transitioning off Insulin drip 6. Mild thrombocytopenia-platelets this am 89,000 7. Transfer to 4E  Ardelle Balls PA-C 03/06/2023 7:12 AM
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
## 2023-03-06 NOTE — Plan of Care (Signed)
  Problem: Education: Goal: Understanding of cardiac disease, CV risk reduction, and recovery process will improve Outcome: Progressing   Problem: Activity: Goal: Ability to tolerate increased activity will improve Outcome: Progressing   Problem: Education: Goal: Knowledge of General Education information will improve Description: Including pain rating scale, medication(s)/side effects and non-pharmacologic comfort measures Outcome: Progressing   Problem: Health Behavior/Discharge Planning: Goal: Ability to manage health-related needs will improve Outcome: Progressing   Problem: Education: Goal: Understanding of CV disease, CV risk reduction, and recovery process will improve Outcome: Progressing   Problem: Education: Goal: Knowledge of the prescribed therapeutic regimen will improve Outcome: Progressing   Problem: Skin Integrity: Goal: Wound healing without signs and symptoms of infection Outcome: Progressing Goal: Risk for impaired skin integrity will decrease Outcome: Progressing   Problem: Clinical Measurements: Goal: Respiratory complications will improve Outcome: Not Progressing

## 2023-03-06 NOTE — Progress Notes (Signed)
 TCTS DAILY ICU PROGRESS NOTE                   [address]            [place] [address]          [phone]   2 Days Post-Op Procedure(s) (LRB): CORONARY ARTERY BYPASS GRAFTING X 4, USING LEFT INTERNAL MAMMARY ARTERY AND ENDOSCOPICALLY HARVESTED RIGHT GREATER SAPHENOUS VEIN (N/A) TRANSESOPHAGEAL ECHOCARDIOGRAM (TEE) (N/A)  Total Length of Stay:  LOS: 8 days   Subjective: Patient has already walked this am. He is lying in chair without complaints this am.  Objective: Vital signs in last 24 hours: Temp:  [98.7 F (37.1 C)-100 F (37.8 C)] 99.7 F (37.6 C) (02/26 0300) Pulse Rate:  [72-93] 72 (02/26 0800) Cardiac Rhythm: Normal sinus rhythm (02/25 2138) Resp:  [14-27] 19 (02/26 0800) BP: (99-130)/(59-73) 113/65 (02/26 0800) SpO2:  [90 %-98 %] 96 % (02/26 0800) Arterial Line BP: (135-138)/(47-48) 138/47 (02/25 1000) Weight:  [100.9 kg] 100.9 kg (02/26 0500)  Filed Weights   02/27/23 1330 03/05/23 0500 03/06/23 0500  Weight: 97.4 kg 103 kg 100.9 kg    Weight change: -2.066 kg   Hemodynamic parameters for last 24 hours: CO:  [7.9 L/min-8.3 L/min] 8.3 L/min CI:  [3.6 L/min/m2-3.8 L/min/m2] 3.8 L/min/m2  Intake/Output from previous day: 02/25 0701 - 02/26 0700 In: 920.2 [P.O.:700; I.V.:34.1; IV Piggyback:186.1] Out: 2095 [Urine:1865; Chest Tube:230]  Intake/Output this shift: Total I/O In: -  Out: 20 [Chest Tube:20]  Current Meds: Scheduled Meds:  acetaminophen  1,000 mg Oral Q6H   Or   acetaminophen (TYLENOL) oral liquid 160 mg/5 mL  1,000 mg Per Tube Q6H   aspirin EC  81 mg Oral Daily   atorvastatin  80 mg Oral QPM   bisacodyl  10 mg Oral Daily   Or   bisacodyl  10 mg Rectal Daily   Chlorhexidine Gluconate Cloth  6 each Topical Daily   Chlorhexidine Gluconate Cloth  6 each Topical Daily   clopidogrel  75 mg Oral Daily   docusate sodium  200 mg Oral Daily   furosemide  40 mg Oral Daily   insulin aspart  0-24 Units Subcutaneous Q4H    metoprolol tartrate  25 mg Oral BID   pantoprazole  40 mg Oral Daily   potassium chloride  20 mEq Oral Daily   sodium chloride flush  10-40 mL Intracatheter Q12H   sodium chloride flush  3 mL Intravenous Q12H   sodium chloride flush  3-10 mL Intravenous Q12H   sodium chloride flush  3-10 mL Intravenous Q12H   sodium chloride flush  3-10 mL Intravenous Q12H   sodium chloride flush  3-10 mL Intravenous Q12H   tamsulosin  0.4 mg Oral Daily   Continuous Infusions:  albumin human     insulin Stopped (03/05/23 0704)   PRN Meds:.albumin human, dextrose, metoprolol tartrate, morphine injection, ondansetron (ZOFRAN) IV, mouth rinse, oxyCODONE, sodium chloride flush, sodium chloride flush, sodium chloride flush, sodium chloride flush, sodium chloride flush, sodium chloride flush, traMADol  General appearance: alert, cooperative, and no distress Neurologic: intact Heart: RRR, murmur Lungs: Diminished bibasilar breath sounds Abdomen: Soft, non tender, sporadic bowel sounds present Extremities: Mild LE edema Wound: Aquacel intact. RLE wounds are clean and dry  Lab Results: CBC: Recent Labs    03/05/23 1644 03/06/23 0450  WBC 9.4 9.7  HGB 10.0* 9.8*  HCT 30.7* 29.8*  PLT 88* 89*   BMET:  Recent  Labs    03/05/23 1643 03/06/23 0450  NA 135 136  K 4.2 4.2  CL 101 101  CO2 25 29  GLUCOSE 128* 131*  BUN 17 17  CREATININE 1.02 0.95  CALCIUM 8.4* 8.1*    CMET: Lab Results  Component Value Date   WBC 9.7 03/06/2023   HGB 9.8 (L) 03/06/2023   HCT 29.8 (L) 03/06/2023   PLT 89 (L) 03/06/2023   GLUCOSE 131 (H) 03/06/2023   CHOL 123 02/27/2023   TRIG 49 02/27/2023   HDL 47 02/27/2023   LDLDIRECT 136.0 10/10/2021   LDLCALC 66 02/27/2023   ALT 20 02/27/2023   AST 23 02/27/2023   NA 136 03/06/2023   K 4.2 03/06/2023   CL 101 03/06/2023   CREATININE 0.95 03/06/2023   BUN 17 03/06/2023   CO2 29 03/06/2023   TSH 1.323 02/27/2023   PSA 4.80 (H) 06/13/2022   INR 1.4 (H)  03/04/2023   HGBA1C 6.3 (H) 02/27/2023   MICROALBUR 1.7 10/10/2021      PT/INR:  Recent Labs    03/04/23 1347  LABPROT 17.7*  INR 1.4*   Radiology: No results found.    Assessment/Plan: S/P Procedure(s) (LRB): CORONARY ARTERY BYPASS GRAFTING X 4, USING LEFT INTERNAL MAMMARY ARTERY AND ENDOSCOPICALLY HARVESTED RIGHT GREATER SAPHENOUS VEIN (N/A) TRANSESOPHAGEAL ECHOCARDIOGRAM (TEE) (N/A) CV- S/[ NSTEMI. SR. On Lopressor 25 mg bid and Plavix Pulmonary-on room air. Chest tubes with 230 cc since surgery. CXR this am appears to show low ling volumes,cardiomegaly, bibasilar atelectasis and small pleural effusions. Remove chest tubes.Encourage incentive spirometer and flutter valve. Above pre op weight, LE edema-will give daily Lasix Expected post op blood loss anemia-H and H this am 9.8 and 29.8 DM-CBGs 115/133/139. Pre op HGA1C 6.3. Transitioning off Insulin drip 6. Mild thrombocytopenia-platelets this am 89,000 7. Transfer to 4E  Corliss Skains PA-C 03/06/2023 8:31 AM   Agree with above Doing well Floor today  Corliss Skains

## 2023-03-07 ENCOUNTER — Inpatient Hospital Stay (HOSPITAL_COMMUNITY): Payer: 59

## 2023-03-07 LAB — CBC
HCT: 29.3 % — ABNORMAL LOW (ref 39.0–52.0)
Hemoglobin: 9.7 g/dL — ABNORMAL LOW (ref 13.0–17.0)
MCH: 28.3 pg (ref 26.0–34.0)
MCHC: 33.1 g/dL (ref 30.0–36.0)
MCV: 85.4 fL (ref 80.0–100.0)
Platelets: 106 10*3/uL — ABNORMAL LOW (ref 150–400)
RBC: 3.43 MIL/uL — ABNORMAL LOW (ref 4.22–5.81)
RDW: 14 % (ref 11.5–15.5)
WBC: 11.1 10*3/uL — ABNORMAL HIGH (ref 4.0–10.5)
nRBC: 0 % (ref 0.0–0.2)

## 2023-03-07 LAB — BPAM RBC
Blood Product Expiration Date: 202503092359
Blood Product Expiration Date: 202503122359
Unit Type and Rh: 5100
Unit Type and Rh: 5100

## 2023-03-07 LAB — TYPE AND SCREEN
ABO/RH(D): O POS
Antibody Screen: NEGATIVE
Unit division: 0
Unit division: 0

## 2023-03-07 LAB — BASIC METABOLIC PANEL
Anion gap: 10 (ref 5–15)
BUN: 22 mg/dL (ref 8–23)
CO2: 26 mmol/L (ref 22–32)
Calcium: 8.4 mg/dL — ABNORMAL LOW (ref 8.9–10.3)
Chloride: 99 mmol/L (ref 98–111)
Creatinine, Ser: 0.91 mg/dL (ref 0.61–1.24)
GFR, Estimated: >60 mL/min (ref >60)
Glucose, Bld: 141 mg/dL — ABNORMAL HIGH (ref 70–99)
Potassium: 4.1 mmol/L (ref 3.5–5.1)
Sodium: 135 mmol/L (ref 135–145)

## 2023-03-07 LAB — GLUCOSE, CAPILLARY
Glucose-Capillary: 138 mg/dL — ABNORMAL HIGH (ref 70–99)
Glucose-Capillary: 108 mg/dL — ABNORMAL HIGH (ref 70–99)
Glucose-Capillary: 105 mg/dL — ABNORMAL HIGH (ref 70–99)

## 2023-03-07 MED ORDER — GLUCERNA SHAKE PO LIQD
237.0000 mL | Freq: Three times a day (TID) | ORAL | Status: DC
  Administered 2023-03-07 – 2023-03-09 (×4): 237 mL via ORAL

## 2023-03-07 MED ORDER — METOPROLOL TARTRATE 25 MG PO TABS
37.5000 mg | ORAL_TABLET | Freq: Two times a day (BID) | ORAL | Status: DC
  Administered 2023-03-07 (×2): 37.5 mg via ORAL
  Filled 2023-03-07 (×2): qty 1

## 2023-03-07 NOTE — Progress Notes (Signed)
 CARDIAC REHAB PHASE I   PRE:  Rate/Rhythm: 100 ST  BP:  Sitting: 118/79      SaO2: 93 RA   MODE:  Ambulation: 470 ft   POST:  Rate/Rhythm: 122 ST   BP:  Sitting: 137/74      SaO2: 95 RA   Pt ambulated in hallway using front wheel walker. Tolerated well with no pain, SOB or dizziness. Mobilizing with proper sternal precautions. Returned to bed with call bell and bedside table in reach. Post OHS education including site care, restrictions, heart healthy diabetic diet, sternal precautions, IS use at home, home needs at discharge, exercise guidelines and CRP2 reviewed. All questions and concerns addressed. Will refer to [facility] for CRP2. Will continue to follow.   [id number] Woodroe Chen, RN BSN 03/07/2023 10:15 AM

## 2023-03-07 NOTE — Progress Notes (Signed)
   03/07/23 0025  BiPAP/CPAP/SIPAP  Reason BIPAP/CPAP not in use Non-compliant (does wear cpap at home but does not want to wear one here)

## 2023-03-07 NOTE — Plan of Care (Signed)
  Problem: Education: Goal: Understanding of cardiac disease, CV risk reduction, and recovery process will improve Outcome: Progressing Goal: Individualized Educational Video(s) Outcome: Progressing   Problem: Activity: Goal: Ability to tolerate increased activity will improve Outcome: Progressing   Problem: Cardiac: Goal: Ability to achieve and maintain adequate cardiovascular perfusion will improve Outcome: Progressing   Problem: Health Behavior/Discharge Planning: Goal: Ability to safely manage health-related needs after discharge will improve Outcome: Progressing   Problem: Education: Goal: Knowledge of General Education information will improve Description: Including pain rating scale, medication(s)/side effects and non-pharmacologic comfort measures Outcome: Progressing   Problem: Health Behavior/Discharge Planning: Goal: Ability to manage health-related needs will improve Outcome: Progressing   Problem: Clinical Measurements: Goal: Ability to maintain clinical measurements within normal limits will improve Outcome: Progressing Goal: Will remain free from infection Outcome: Progressing Goal: Diagnostic test results will improve Outcome: Progressing Goal: Respiratory complications will improve Outcome: Progressing Goal: Cardiovascular complication will be avoided Outcome: Progressing   Problem: Skin Integrity: Goal: Risk for impaired skin integrity will decrease Outcome: Progressing   Problem: Education: Goal: Understanding of CV disease, CV risk reduction, and recovery process will improve Outcome: Progressing Goal: Individualized Educational Video(s) Outcome: Progressing   Problem: Health Behavior/Discharge Planning: Goal: Ability to safely manage health-related needs after discharge will improve Outcome: Progressing   Problem: Education: Goal: Will demonstrate proper wound care and an understanding of methods to prevent future damage Outcome:  Progressing Goal: Knowledge of disease or condition will improve Outcome: Progressing Goal: Knowledge of the prescribed therapeutic regimen will improve Outcome: Progressing Goal: Individualized Educational Video(s) Outcome: Progressing   Problem: Activity: Goal: Risk for activity intolerance will decrease Outcome: Progressing   Problem: Cardiac: Goal: Will achieve and/or maintain hemodynamic stability Outcome: Progressing   Problem: Clinical Measurements: Goal: Postoperative complications will be avoided or minimized Outcome: Progressing   Problem: Respiratory: Goal: Respiratory status will improve Outcome: Progressing   Problem: Skin Integrity: Goal: Wound healing without signs and symptoms of infection Outcome: Progressing Goal: Risk for impaired skin integrity will decrease Outcome: Progressing   Problem: Urinary Elimination: Goal: Ability to achieve and maintain adequate renal perfusion and functioning will improve Outcome: Progressing   Problem: Safety: Goal: Non-violent Restraint(s) Outcome: Progressing

## 2023-03-07 NOTE — Progress Notes (Addendum)
      [address]       [place] [address]             [phone]        3 Days Post-Op Procedure(s) (LRB): CORONARY ARTERY BYPASS GRAFTING X 4, USING LEFT INTERNAL MAMMARY ARTERY AND ENDOSCOPICALLY HARVESTED RIGHT GREATER SAPHENOUS VEIN (N/A) TRANSESOPHAGEAL ECHOCARDIOGRAM (TEE) (N/A)  Subjective: Patient slept fairly well. He is passing flatus but no bowel movement yet.  Objective: Vital signs in last 24 hours: Temp:  [98.4 F (36.9 C)-99.6 F (37.6 C)] 99.6 F (37.6 C) (02/27 0352) Pulse Rate:  [72-109] 108 (02/27 0352) Cardiac Rhythm: Normal sinus rhythm (02/26 1935) Resp:  [15-25] 19 (02/27 0352) BP: (96-141)/(55-79) 135/79 (02/27 0352) SpO2:  [90 %-99 %] 99 % (02/27 0352) Weight:  [99.8 kg] 99.8 kg (02/27 0352)  Pre op weight 97 kg Current Weight  03/07/23 99.8 kg      Intake/Output from previous day: 02/26 0701 - 02/27 0700 In: 240 [P.O.:240] Out: 420 [Urine:400; Chest Tube:20]   Physical Exam:  Cardiovascular: Slightly tachycardic Pulmonary: Diminished bibasilar breath sounds Abdomen: Soft, non tender, bowel sounds present. Extremities: Mild bilateral lower extremity edema. Wounds: Aquacel removed and sternal wound is clean and dry.  RLE wounds are clean and dry. No erythema or signs of infection.  Lab Results: CBC: Recent Labs    03/05/23 1644 03/06/23 0450  WBC 9.4 9.7  HGB 10.0* 9.8*  HCT 30.7* 29.8*  PLT 88* 89*   BMET:  Recent Labs    03/05/23 1643 03/06/23 0450  NA 135 136  K 4.2 4.2  CL 101 101  CO2 25 29  GLUCOSE 128* 131*  BUN 17 17  CREATININE 1.02 0.95  CALCIUM 8.4* 8.1*    PT/INR:  Lab Results  Component Value Date   INR 1.4 (H) 03/04/2023   INR 1.1 03/03/2023   INR 1.07 11/26/2016   ABG:  INR: Will add last result for INR, ABG once components are confirmed Will add last 4 CBG results once components are confirmed  Assessment/Plan: CV- S//p NSTEMI.  SR;ST this am. On Lopressor 25 mg bid and  Plavix. Will increase Lopressor for better HR control Pulmonary-On 1 liter of oxygen via [place]. Wean as able. CXR this am appears to show cardiomegaly, bibasilar atelectasis and small pleural effusions. Encourage incentive spirometer and flutter valve. Above pre op weight, LE edema-continue daily Lasix Expected post op blood loss anemia-H and H yesterday 9.8 and 29.8 DM-CBGs 131/137/138. Pre op HGA1C 6.3. Transitioning off Insulin drip 6. Mild thrombocytopenia-platelets yesterday 89,000 7. Await this am's labs  Donielle M ZimmermanPA-C 6:54 AM   Agree with above Titrating meds Dispo planning  Jamyrah Saur O Mordechai Matuszak

## 2023-03-08 ENCOUNTER — Inpatient Hospital Stay (HOSPITAL_COMMUNITY): Payer: 59

## 2023-03-08 LAB — GLUCOSE, CAPILLARY
Glucose-Capillary: 107 mg/dL — ABNORMAL HIGH (ref 70–99)
Glucose-Capillary: 117 mg/dL — ABNORMAL HIGH (ref 70–99)
Glucose-Capillary: 118 mg/dL — ABNORMAL HIGH (ref 70–99)
Glucose-Capillary: 127 mg/dL — ABNORMAL HIGH (ref 70–99)
Glucose-Capillary: 144 mg/dL — ABNORMAL HIGH (ref 70–99)

## 2023-03-08 MED ORDER — IRBESARTAN 75 MG PO TABS
37.5000 mg | ORAL_TABLET | Freq: Every day | ORAL | Status: DC
  Administered 2023-03-08 – 2023-03-09 (×2): 37.5 mg via ORAL
  Filled 2023-03-08 (×2): qty 0.5

## 2023-03-08 MED ORDER — LACTULOSE 10 GM/15ML PO SOLN
20.0000 g | Freq: Once | ORAL | Status: AC
  Administered 2023-03-08: 20 g via ORAL
  Filled 2023-03-08: qty 30

## 2023-03-08 MED ORDER — INSULIN ASPART 100 UNIT/ML IJ SOLN
0.0000 [IU] | Freq: Three times a day (TID) | INTRAMUSCULAR | Status: DC
  Administered 2023-03-09: 2 [IU] via SUBCUTANEOUS

## 2023-03-08 MED ORDER — METOPROLOL TARTRATE 50 MG PO TABS
50.0000 mg | ORAL_TABLET | Freq: Two times a day (BID) | ORAL | Status: DC
  Administered 2023-03-08 – 2023-03-09 (×3): 50 mg via ORAL
  Filled 2023-03-08 (×3): qty 1

## 2023-03-08 NOTE — Progress Notes (Addendum)
      [address]       [place] [address]             [phone]        4 Days Post-Op Procedure(s) (LRB): CORONARY ARTERY BYPASS GRAFTING X 4, USING LEFT INTERNAL MAMMARY ARTERY AND ENDOSCOPICALLY HARVESTED RIGHT GREATER SAPHENOUS VEIN (N/A) TRANSESOPHAGEAL ECHOCARDIOGRAM (TEE) (N/A)  Subjective: Patient states he is "peeing a lot". He has not had bowel movement yet.  Objective: Vital signs in last 24 hours: Temp:  [98.3 F (36.8 C)-100.1 F (37.8 C)] 98.5 F (36.9 C) (02/28 0337) Pulse Rate:  [90-100] 100 (02/28 0337) Cardiac Rhythm: Sinus tachycardia (02/27 2345) Resp:  [18-23] 20 (02/28 0337) BP: (118-145)/(71-79) 145/77 (02/28 0337) SpO2:  [93 %-94 %] 94 % (02/28 0337) Weight:  [97.4 kg] 97.4 kg (02/28 0411)  Pre op weight 97 kg Current Weight  03/08/23 97.4 kg      Intake/Output from previous day: 02/27 0701 - 02/28 0700 In: -  Out: 950 [Urine:950]   Physical Exam:  Cardiovascular: Slightly tachycardic Pulmonary: Diminished bibasilar breath sounds Abdomen: Soft, non tender, bowel sounds present. Extremities: Trace bilateral lower extremity edema. Wounds: Sternal wound is clean and dry.  RLE wounds are clean and dry. No erythema or signs of infection.  Lab Results: CBC: Recent Labs    03/06/23 0450 03/07/23 0725  WBC 9.7 11.1*  HGB 9.8* 9.7*  HCT 29.8* 29.3*  PLT 89* 106*   BMET:  Recent Labs    03/06/23 0450 03/07/23 0725  NA 136 135  K 4.2 4.1  CL 101 99  CO2 29 26  GLUCOSE 131* 141*  BUN 17 22  CREATININE 0.95 0.91  CALCIUM 8.1* 8.4*    PT/INR:  Lab Results  Component Value Date   INR 1.4 (H) 03/04/2023   INR 1.1 03/03/2023   INR 1.07 11/26/2016   ABG:  INR: Will add last result for INR, ABG once components are confirmed Will add last 4 CBG results once components are confirmed  Assessment/Plan: CV- S/p NSTEMI. PVCs,ST this am. On Lopressor 37.5 mg bid and Plavix. Will increase Lopressor for better HR  control. He was on Amlodipine/Valsartan prior to admission. Will restart low dose Valsartan. Pulmonary-On room air. Encourage incentive spirometer and flutter valve. Above pre op weight, LE edema-continue daily Lasix Expected post op blood loss anemia-H and H yesterday 9.7 and 29.3 DM-CBGs 105/107/118. Pre op HGA1C 6.3. He is diet controlled. 6. Mild thrombocytopenia-platelets yesterday up to 106,000 7. LOC constipation 8. Hopefully, home in am  Donielle M ZimmermanPA-C 7:03 AM  Agree with above Dispo planning  Maritza Hosterman Keane Scrape

## 2023-03-08 NOTE — Progress Notes (Signed)
 CARDIAC REHAB PHASE I   PRE:  Rate/Rhythm: 95 SR      SaO2: 96 RA   MODE:  Ambulation: 470 ft   POST:  Rate/Rhythm: 98 SR                             SaO2: 97 RA   Pt ambulated in hallway, using front wheel walker. Tolerated well with no pain, SOB or dizziness. Returned to chair with call bell and bedside table in reach. Postop OHS education completed yesterday. No questions or concerns. Referral sent to [facility]. Will continue to follow.   [id number] Woodroe Chen, RN BSN 03/08/2023 10:54 AM

## 2023-03-08 NOTE — Progress Notes (Signed)
 [facility] will sign off.   Medication Recommendations:  agree with current medications Other recommendations (labs, testing, etc):  none Follow up as an outpatient:  as scheduled   Marcelino Duster, PA-C 03/08/2023, 10:46 AM [phone] [facility]

## 2023-03-09 ENCOUNTER — Other Ambulatory Visit (HOSPITAL_COMMUNITY): Payer: Self-pay

## 2023-03-09 LAB — GLUCOSE, CAPILLARY
Glucose-Capillary: 131 mg/dL — ABNORMAL HIGH (ref 70–99)
Glucose-Capillary: 121 mg/dL — ABNORMAL HIGH (ref 70–99)

## 2023-03-09 MED ORDER — OXYBUTYNIN CHLORIDE ER 5 MG PO TB24: 5.0000 mg | ORAL_TABLET | Freq: Every morning | ORAL | Status: DC

## 2023-03-09 MED ORDER — AMLODIPINE-VALSARTAN-HCTZ 10-320-25 MG PO TABS
1.0000 | ORAL_TABLET | Freq: Every day | ORAL | 1 refills | Status: DC
  Filled 2023-03-09 – 2023-04-11 (×4): qty 90, 90d supply, fill #0

## 2023-03-09 MED ORDER — METOPROLOL TARTRATE 50 MG PO TABS
50.0000 mg | ORAL_TABLET | Freq: Two times a day (BID) | ORAL | 1 refills | Status: DC
  Filled 2023-03-09 – 2023-04-11 (×2): qty 60, 30d supply, fill #0

## 2023-03-09 MED ORDER — CLOPIDOGREL BISULFATE 75 MG PO TABS
75.0000 mg | ORAL_TABLET | Freq: Every day | ORAL | 1 refills | Status: DC
  Filled 2023-03-09 – 2023-04-11 (×2): qty 30, 30d supply, fill #0

## 2023-03-09 MED ORDER — ASPIRIN 81 MG PO TBEC: 81.0000 mg | DELAYED_RELEASE_TABLET | Freq: Every day | ORAL | Status: AC

## 2023-03-09 MED ORDER — OXYCODONE HCL 5 MG PO TABS
5.0000 mg | ORAL_TABLET | Freq: Four times a day (QID) | ORAL | 0 refills | Status: DC | PRN
  Filled 2023-03-09: qty 30, 7d supply, fill #0

## 2023-03-09 NOTE — Progress Notes (Signed)
      [address]       [place] [address]             [phone]      5 Days Post-Op Procedure(s) (LRB): CORONARY ARTERY BYPASS GRAFTING X 4, USING LEFT INTERNAL MAMMARY ARTERY AND ENDOSCOPICALLY HARVESTED RIGHT GREATER SAPHENOUS VEIN (N/A) TRANSESOPHAGEAL ECHOCARDIOGRAM (TEE) (N/A)  Subjective:  Patient feeling great.  Feels ready to go home today.  He has already ambulated around the unit.  Objective: Vital signs in last 24 hours: Temp:  [98.1 F (36.7 C)-98.6 F (37 C)] 98.2 F (36.8 C) (03/01 0849) Pulse Rate:  [81-95] 89 (03/01 0849) Cardiac Rhythm: Normal sinus rhythm (03/01 0119) Resp:  [16-22] 20 (03/01 0849) BP: (122-147)/(78-84) 122/78 (03/01 0849) SpO2:  [95 %-97 %] 96 % (03/01 0849) Weight:  [95.4 kg] 95.4 kg (03/01 0611)  Intake/Output from previous day: 02/28 0701 - 03/01 0700 In: 120 [P.O.:120] Out: -  Intake/Output this shift: Total I/O In: 240 [P.O.:240] Out: -   General appearance: alert, cooperative, and no distress Heart: regular rate and rhythm Lungs: clear to auscultation bilaterally Abdomen: soft, non-tender; bowel sounds normal; no masses,  no organomegaly Extremities: extremities normal, atraumatic, no cyanosis or edema Wound: clean and ry  Lab Results: Recent Labs    03/07/23 0725  WBC 11.1*  HGB 9.7*  HCT 29.3*  PLT 106*   BMET:  Recent Labs    03/07/23 0725  NA 135  K 4.1  CL 99  CO2 26  GLUCOSE 141*  BUN 22  CREATININE 0.91  CALCIUM 8.4*    PT/INR: No results for input(s): "LABPROT", "INR" in the last 72 hours. ABG    Component Value Date/Time   PHART 7.302 (L) 03/04/2023 1859   HCO3 23.1 03/04/2023 1859   TCO2 25 03/04/2023 1859   ACIDBASEDEF 3.0 (H) 03/04/2023 1859   O2SAT 93 03/04/2023 1859   CBG (last 3)  Recent Labs    03/08/23 1535 03/08/23 2113 03/09/23 0608  GLUCAP 144* 117* 131*    Assessment/Plan: S/P Procedure(s) (LRB): CORONARY ARTERY BYPASS GRAFTING X 4, USING LEFT  INTERNAL MAMMARY ARTERY AND ENDOSCOPICALLY HARVESTED RIGHT GREATER SAPHENOUS VEIN (N/A) TRANSESOPHAGEAL ECHOCARDIOGRAM (TEE) (N/A)  CV- NSR, BP elevated- will resume home antihypertensive- contniue Lopressor, Plavix Pulm- no acute issues, doesn't require oxygen... continue IS Renal- minima edema on exam.. no need to continue Lasix at discharge DM- diet controlled GI- constipation resolved Dispo- patient doing well, will d/c home today   LOS: 11 days    Lowella Dandy, PA-C 03/09/2023

## 2023-03-09 NOTE — Progress Notes (Deleted)
 Patient oxygen saturation between 84%-89% while sleeping. Placed 2L [place] on patient. Patient is fatigued and is sleeping with his mouth open. Notified PA. Will place non re-breather.   Kenard Gower, RN

## 2023-03-09 NOTE — Progress Notes (Signed)
 Cardiac rehab came by to walk pt and he stated he had already walked this morning. RN and family at bedside.

## 2023-03-09 NOTE — Progress Notes (Addendum)
 Patient given discharge instructions. Significant other present. PIV removed. Telemetry box removed, CCMD notified. TOC meds picked up. Patient taken to vehicle in wheelchair by staff.  Kenard Gower, RN

## 2023-03-09 NOTE — Progress Notes (Signed)
 Mobility Specialist Progress Note:    03/09/23 1154  Mobility  Activity Ambulated independently in hallway  Level of Assistance Standby assist, set-up cues, supervision of patient - no hands on  Assistive Device None  Distance Ambulated (ft) 640 ft  Activity Response Tolerated well  Mobility Referral Yes  Mobility visit 1 Mobility  Mobility Specialist Start Time (ACUTE ONLY) 1145  Mobility Specialist Stop Time (ACUTE ONLY) 1153  Mobility Specialist Time Calculation (min) (ACUTE ONLY) 8 min   Pt received in bed, family in room. Eager for d/c. Ambulated in hallway with SV, no AD required. Tolerated well, asx throughout. Returned pt back to room, all needs met.   Feliciana Rossetti Mobility Specialist Please contact via Special educational needs teacher or  Rehab office at [phone]

## 2023-03-09 NOTE — Progress Notes (Signed)
 RNCM received DME order for RW.  RNCM spoke to patient and RW will be delivered to patient's room prior to discharge home today.  RNCM spoke to Vincent [name] with [employer] and confirmation received.

## 2023-03-09 NOTE — Progress Notes (Signed)
 RW order cancelled-patient decided he does not need.

## 2023-03-11 ENCOUNTER — Other Ambulatory Visit (HOSPITAL_BASED_OUTPATIENT_CLINIC_OR_DEPARTMENT_OTHER): Payer: Self-pay

## 2023-03-11 ENCOUNTER — Telehealth: Payer: Self-pay | Admitting: *Deleted

## 2023-03-11 NOTE — Transitions of Care (Post Inpatient/ED Visit) (Signed)
 03/11/2023  Name: Vincent [name] MRN: [id number] DOB: 09/07/1955  Today's TOC FU Call Status: Today's TOC FU Call Status:: Successful TOC FU Call Completed TOC FU Call Complete Date: 03/11/23 Patient's Name and Date of Birth confirmed.  Transition Care Management Follow-up Telephone Call Date of Discharge: 03/09/23 Discharge Facility: [facility]) Type of Discharge: Inpatient Admission Primary Inpatient Discharge Diagnosis:: non-ST elevated myocardial infarction How have you been since you were released from the hospital?: Better Any questions or concerns?: No  Items Reviewed: Did you receive and understand the discharge instructions provided?: Yes Medications obtained,verified, and reconciled?: Yes (Medications Reviewed) Any new allergies since your discharge?: No Do you have support at home?: Yes People in Home: significant other Name of Support/Comfort Primary Source: Rubin Payor sister and Vernona Rieger significant other  Medications Reviewed Today: Medications Reviewed Today     Reviewed by Luella Cook, RN (Case Manager) on 03/11/23 at 1603  Med List Status: <None>   Medication Order Taking? Sig Documenting Provider Last Dose Status Informant  amLODIPine-Valsartan-HCTZ 10-320-25 MG TABS [id number] Yes Take 1 tablet by mouth daily. Barrett, Rae Roam, PA-C Taking Active   aspirin EC 81 MG tablet [id number] Yes Take 1 tablet (81 mg total) by mouth daily. Swallow whole. Barrett, Rae Roam, PA-C Taking Active   atorvastatin (LIPITOR) 80 MG tablet [id number] Yes Take 1 tablet (80 mg total) by mouth daily at 6 PM.  Patient taking differently: Take 80 mg by mouth in the morning.   Sharlene Dory, DO Taking Active Self, Pharmacy Records  clopidogrel (PLAVIX) 75 MG tablet [id number] Yes Take 1 tablet (75 mg total) by mouth daily. Barrett, Rae Roam, PA-C Taking Active   metoprolol tartrate (LOPRESSOR) 50 MG tablet [id number] Yes Take 1 tablet (50 mg total) by mouth 2 (two) times daily.  Barrett, Rae Roam, PA-C Taking Active   oxybutynin (DITROPAN XL) 5 MG 24 hr tablet [id number] Yes Take 1 tablet (5 mg total) by mouth in the morning. Barrett, Rae Roam, PA-C Taking Active   oxyCODONE (OXY IR/ROXICODONE) 5 MG immediate release tablet [id number] Yes Take 1 tablet (5 mg total) by mouth every 6 (six) hours as needed for severe pain (pain score 7-10). Barrett, Rae Roam, PA-C Taking Active   tamsulosin (FLOMAX) 0.4 MG CAPS capsule [id number] Yes Take 1 capsule (0.4 mg total) by mouth daily. Sharlene Dory, DO Taking Active Self, Pharmacy Records  Med List Note Vella Kohler, [facility] 05/04/22 1420): Called on 05/04/22  regarding a refill on amlo/val/hctz  - [facility] and Equipment/Supplies: Were Home Health Services Ordered?: NA Any new equipment or medical supplies ordered?: NA  Functional Questionnaire: Do you need assistance with bathing/showering or dressing?: No Do you need assistance with meal preparation?: Yes Do you need assistance with eating?: No Do you have difficulty maintaining continence: No Do you need assistance with getting out of bed/getting out of a chair/moving?: No Do you have difficulty managing or taking your medications?: No  Follow up appointments reviewed: PCP Follow-up appointment confirmed?: NA Specialist Hospital Follow-up appointment confirmed?: Yes Date of Specialist follow-up appointment?: 03/15/22 Follow-Up Specialty Provider:: Dr Cliffton Asters  virtual appt/ [id number] Bernadene Person NP Do you need transportation to your follow-up appointment?: No Do you understand care options if your condition(s) worsen?: Yes-patient verbalized understanding  SDOH Interventions Today    Flowsheet Row Most Recent Value  SDOH Interventions   Food Insecurity Interventions Intervention Not Indicated  Housing Interventions Intervention Not Indicated  Transportation Interventions Intervention Not Indicated  Utilities Interventions  Intervention Not Indicated      Interventions Today    Flowsheet Row Most Recent Value  Chronic Disease   Chronic disease during today's visit Other  [non-ST elevated myocardial infarction]  General Interventions   General Interventions Discussed/Reviewed General Interventions Discussed, General Interventions Reviewed, Doctor Visits  Doctor Visits Discussed/Reviewed Doctor Visits Discussed, Doctor Visits Reviewed, Specialist  PCP/Specialist Visits Compliance with follow-up visit  Education Interventions   Education Provided Provided Education  Provided Verbal Education On Medication, Insurance Plans  Pharmacy Interventions   Pharmacy Dicussed/Reviewed Pharmacy Topics Discussed, Pharmacy Topics Reviewed, Medications and their functions      Declined TOC outreach follow up calls  Gean Maidens BSN RN [employer] [facility] Health Care Management Coordinator [email or web address] Direct Dial: [phone]  Fax: [phone] Website: [email or web address]

## 2023-03-12 ENCOUNTER — Other Ambulatory Visit (HOSPITAL_BASED_OUTPATIENT_CLINIC_OR_DEPARTMENT_OTHER): Payer: Self-pay

## 2023-03-12 ENCOUNTER — Other Ambulatory Visit: Payer: Self-pay

## 2023-03-13 ENCOUNTER — Telehealth (HOSPITAL_COMMUNITY): Payer: Self-pay | Admitting: *Deleted

## 2023-03-13 ENCOUNTER — Other Ambulatory Visit (HOSPITAL_BASED_OUTPATIENT_CLINIC_OR_DEPARTMENT_OTHER): Payer: Self-pay

## 2023-03-13 NOTE — Telephone Encounter (Signed)
 Cardiac Rehab Phase II referral faxed to [facility] per pt request. Ethelda Chick BS, ACSM-CEP 03/13/2023 1:40 PM

## 2023-03-15 ENCOUNTER — Ambulatory Visit: Payer: Self-pay | Admitting: Thoracic Surgery (Cardiothoracic Vascular Surgery)

## 2023-03-15 DIAGNOSIS — Z951 Presence of aortocoronary bypass graft: Secondary | ICD-10-CM

## 2023-03-15 NOTE — Progress Notes (Signed)
     [address]       [name] [address]             [phone]       Patient: Home Provider: Office Consent for Telemedicine visit obtained.  Today's visit was completed via a real-time telehealth (see specific modality noted below). The patient/authorized person provided oral consent at the time of the visit to engage in a telemedicine encounter with the present provider at [facility]. The patient/authorized person was informed of the potential benefits, limitations, and risks of telemedicine. The patient/authorized person expressed understanding that the laws that protect confidentiality also apply to telemedicine. The patient/authorized person acknowledged understanding that telemedicine does not provide emergency services and that he or she would need to call 911 or proceed to the nearest hospital for help if such a need arose.   Total time spent in the clinical discussion 10 minutes.  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  Overall doing well.  Pain is minimal.  Ambulating well. Vitals have been stable.  Vincent [name] will see [place] back in 1 month with a chest x-ray for cardiac rehab clearance.  Vincent [name]

## 2023-03-18 DIAGNOSIS — G4733 Obstructive sleep apnea (adult) (pediatric): Secondary | ICD-10-CM | POA: Diagnosis not present

## 2023-03-21 ENCOUNTER — Telehealth: Payer: Self-pay | Admitting: *Deleted

## 2023-03-21 NOTE — Telephone Encounter (Signed)
 Patient contacted answering service early this morning for neck pain. States he feels like he has a stiff neck. States it is slowly getting better. Asking for recommendations to help. States he took a BC powder yesterday that helped the pain. Advised to no longer take [facility] powder and use Tylenol instead. Advised to apply heating pad to area and use light massage on shoulders. Patient verbalized understanding

## 2023-03-25 ENCOUNTER — Ambulatory Visit: Payer: 59 | Attending: Nurse Practitioner | Admitting: Nurse Practitioner

## 2023-03-25 ENCOUNTER — Encounter: Payer: Self-pay | Admitting: Nurse Practitioner

## 2023-03-25 VITALS — BP 118/60 | HR 72 | Ht 71.5 in | Wt 212.4 lb

## 2023-03-25 DIAGNOSIS — I351 Nonrheumatic aortic (valve) insufficiency: Secondary | ICD-10-CM | POA: Diagnosis not present

## 2023-03-25 DIAGNOSIS — I340 Nonrheumatic mitral (valve) insufficiency: Secondary | ICD-10-CM

## 2023-03-25 DIAGNOSIS — I255 Ischemic cardiomyopathy: Secondary | ICD-10-CM | POA: Diagnosis not present

## 2023-03-25 DIAGNOSIS — I509 Heart failure, unspecified: Secondary | ICD-10-CM | POA: Diagnosis not present

## 2023-03-25 DIAGNOSIS — E119 Type 2 diabetes mellitus without complications: Secondary | ICD-10-CM | POA: Diagnosis not present

## 2023-03-25 DIAGNOSIS — G4733 Obstructive sleep apnea (adult) (pediatric): Secondary | ICD-10-CM

## 2023-03-25 DIAGNOSIS — I10 Essential (primary) hypertension: Secondary | ICD-10-CM | POA: Diagnosis not present

## 2023-03-25 DIAGNOSIS — I2510 Atherosclerotic heart disease of native coronary artery without angina pectoris: Secondary | ICD-10-CM

## 2023-03-25 DIAGNOSIS — Z951 Presence of aortocoronary bypass graft: Secondary | ICD-10-CM | POA: Diagnosis not present

## 2023-03-25 DIAGNOSIS — E785 Hyperlipidemia, unspecified: Secondary | ICD-10-CM

## 2023-03-25 DIAGNOSIS — I6523 Occlusion and stenosis of bilateral carotid arteries: Secondary | ICD-10-CM | POA: Diagnosis not present

## 2023-03-25 NOTE — Patient Instructions (Addendum)
 Medication Instructions:  Your physician recommends that you continue on your current medications as directed. Please refer to the Current Medication list given to you today.  *If you need a refill on your cardiac medications before your next appointment, please call your pharmacy*   Lab Work: BMET, BNP, CBC today.  Testing/Procedures: Your physician has requested that you have an echocardiogram in 3 months. Echocardiography is a painless test that uses sound waves to create images of your heart. It provides your doctor with information about the size and shape of your heart and how well your heart's chambers and valves are working. This procedure takes approximately one hour. There are no restrictions for this procedure. Please do NOT wear cologne, perfume, aftershave, or lotions (deodorant is allowed). Please arrive 15 minutes prior to your appointment time.  Please note: We ask at that you not bring children with you during ultrasound (echo/ vascular) testing. Due to room size and safety concerns, children are not allowed in the ultrasound rooms during exams. Our front office staff cannot provide observation of children in our lobby area while testing is being conducted. An adult accompanying a patient to their appointment will only be allowed in the ultrasound room at the discretion of the ultrasound technician under special circumstances. We apologize for any inconvenience.    Follow-Up: At [facility], you and your health needs are our priority.  As part of our continuing mission to provide you with exceptional heart care, we have created designated Provider Care Teams.  These Care Teams include your primary Cardiologist (physician) and Advanced Practice Providers (APPs -  Physician Assistants and Nurse Practitioners) who all work together to provide you with the care you need, when you need it.  We recommend signing up for the patient portal called "MyChart".  Sign up  information is provided on this After Visit Summary.  MyChart is used to connect with patients for Virtual Visits (Telemedicine).  Patients are able to view lab/test results, encounter notes, upcoming appointments, etc.  Non-urgent messages can be sent to your provider as well.   To learn more about what you can do with MyChart, go to [email or web address].    Your next appointment:    3-4 months post Echo  Provider:   Norman Herrlich, MD      1st Floor: - Lobby - Registration  - Pharmacy  - Lab - Cafe  2nd Floor: - PV Lab - Diagnostic Testing (echo, CT, nuclear med)  3rd Floor: - Vacant  4th Floor: - TCTS (cardiothoracic surgery) - AFib Clinic - Structural Heart Clinic - Vascular Surgery  - Vascular Ultrasound  5th Floor: - HeartCare Cardiology (general and EP) - Clinical Pharmacy for coumadin, hypertension, lipid, weight-loss medications, and med management appointments    Valet parking services will be available as well.
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
## 2023-03-26 ENCOUNTER — Encounter: Payer: Self-pay | Admitting: Nurse Practitioner

## 2023-03-26 LAB — BASIC METABOLIC PANEL
BUN/Creatinine Ratio: 14 (ref 10–24)
BUN: 11 mg/dL (ref 8–27)
CO2: 25 mmol/L (ref 20–29)
Calcium: 9.5 mg/dL (ref 8.6–10.2)
Chloride: 101 mmol/L (ref 96–106)
Creatinine, Ser: 0.78 mg/dL (ref 0.76–1.27)
Glucose: 101 mg/dL — ABNORMAL HIGH (ref 70–99)
Potassium: 3.7 mmol/L (ref 3.5–5.2)
Sodium: 141 mmol/L (ref 134–144)
eGFR: 98 mL/min/{1.73_m2} (ref >59)

## 2023-03-26 LAB — CBC
Hematocrit: 34.3 % — ABNORMAL LOW (ref 37.5–51.0)
Hemoglobin: 11.1 g/dL — ABNORMAL LOW (ref 13.0–17.7)
MCH: 27.3 pg (ref 26.6–33.0)
MCHC: 32.4 g/dL (ref 31.5–35.7)
MCV: 84 fL (ref 79–97)
Platelets: 328 10*3/uL (ref 150–450)
RBC: 4.07 x10E6/uL — ABNORMAL LOW (ref 4.14–5.80)
RDW: 14.4 % (ref 11.6–15.4)
WBC: 5 10*3/uL (ref 3.4–10.8)

## 2023-03-26 LAB — BRAIN NATRIURETIC PEPTIDE: BNP: 445 pg/mL — ABNORMAL HIGH (ref 0.0–100.0)

## 2023-04-01 NOTE — Progress Notes (Signed)
 [address]       [name] [address]             [phone]      HPI: Vincent [name] is a 68 year old male with a past medical history of coronary artery disease (STEMI 2018), T2DM, hyperlipidemia, hypertension, OSA on CPAP. The patient returns for routine postoperative follow-up having undergone CABG x 4 by Dr. Cliffton Asters on 03/04/23. The patient had a routine postoperative hospital stay, and was discharged on Plavix for NSTEMI. He was discharged in stable condition 03/09/23. He was seen by cardiology on 03/25/23 and noted a mild cough but is otherwise progressing well. He was noted to have mild fluid volume overload on labs and cardiology started him on daily PO Lasix 40mg .  Since hospital discharge the patient reports he feels great, he has some chest soreness but denies needing narcotic pain medication. He reports 1 episode of shortness of breath early on postoperatively but states this has resolved. He denies cough, dizziness, LOC, and swelling in his lower extremities. He reports he walks at least a mile per day.  Current Outpatient Medications  Medication Sig Dispense Refill   amLODIPine-Valsartan-HCTZ 10-320-25 MG TABS Take 1 tablet by mouth daily. 90 tablet 1   aspirin EC 81 MG tablet Take 1 tablet (81 mg total) by mouth daily. Swallow whole.     atorvastatin (LIPITOR) 80 MG tablet Take 1 tablet (80 mg total) by mouth daily at 6 PM. (Patient taking differently: Take 80 mg by mouth in the morning.) 90 tablet 3   clopidogrel (PLAVIX) 75 MG tablet Take 1 tablet (75 mg total) by mouth daily. 30 tablet 1   metoprolol tartrate (LOPRESSOR) 50 MG tablet Take 1 tablet (50 mg total) by mouth 2 (two) times daily. 60 tablet 1   oxybutynin (DITROPAN XL) 5 MG 24 hr tablet Take 1 tablet (5 mg total) by mouth in the morning.     oxyCODONE (OXY IR/ROXICODONE) 5 MG immediate release tablet Take 1 tablet (5 mg total) by mouth every 6 (six) hours as needed for severe pain (pain score  7-10). 30 tablet 0   tamsulosin (FLOMAX) 0.4 MG CAPS capsule Take 1 capsule (0.4 mg total) by mouth daily. 90 capsule 3   No current facility-administered medications for this visit.   Vitals: Today's Vitals   04/15/23 1429  BP: 113/78  Pulse: 75  Resp: 18  SpO2: 96%  Weight: 214 lb (97.1 kg)  Height: 5\' 11"  (1.803 m)   Body mass index is 29.85 kg/m.  Physical Exam: General: Alert and oriented, no acute distress Neuro: Grossly intact CV: Regular rate and rhythm, no murmur Pulm: Clear to auscultation bilaterally GI: Nontender, no distension Extremities: Trace edema bilateral lower extremities Wound: Clean and dry without sign of infection. Suture protruding from [facility] site which was cleaned and cut.   Diagnostic Tests: CXR without consolidation, pneumothorax, or significant pleural effusion pending final read  Impression/Plan: S/P CABG x 4: Patient is overall progressing very well from surgery, states he feels great. He is walking about 1 mile per day and denies shortness of breath. CXR does not show pneumothorax, consolidation or significant pleural effusion. He admits to minor chest soreness but does not require narcotic pain medication. Cardiology did report he was volume overload based on labs and called him to tell him to start Lasix daily but the patient never received this call. I told him to call cardiology and speak to them  regarding this. Patient has trace edema on exam bilaterally but overall looks euvolemic. He did have a suture protruding from his [facility] site which was cleaned and cut, there was no remaining suture outside of the incision and the incision had no sign of infection. Sternal incision was also well healed without sign of infection. His appetite is slowly improving. He is tolerating medications well, I will not make any medication changes at this time. I cleared him to drive today although he states he has already been driving and is eager to drive his motorcycle  but I told him to wait until 3 months postop. We reviewed continued sternal precautions and I cleared him for cardiac rehab although he states he was not interested since he can do these things at home. I will plan to have the patient return to the clinic as needed.  Jenny Reichmann, PA-C [facility] [phone]

## 2023-04-03 ENCOUNTER — Telehealth: Payer: Self-pay

## 2023-04-03 NOTE — Telephone Encounter (Signed)
 Lmom to discuss lab results and recommendations. Waiting on a return call.

## 2023-04-08 NOTE — Patient Instructions (Signed)
 Continue to avoid any heavy lifting or strenuous use of your arms or shoulders for at least a total of three months from the time of surgery.  After three months you may gradually increase how much you lift or otherwise use your arms or chest as tolerated, with limits based upon whether or not activities lead to the return of significant discomfort.  You are encouraged to enroll and participate in the outpatient cardiac rehab program beginning as soon as practical.  You may return to driving an automobile as long as you are no longer requiring oral narcotic pain relievers during the daytime.  It would be wise to start driving only short distances during the daylight and gradually increase from there as you feel comfortable.

## 2023-04-11 ENCOUNTER — Other Ambulatory Visit (HOSPITAL_BASED_OUTPATIENT_CLINIC_OR_DEPARTMENT_OTHER): Payer: Self-pay

## 2023-04-12 ENCOUNTER — Other Ambulatory Visit: Payer: Self-pay | Admitting: Thoracic Surgery (Cardiothoracic Vascular Surgery)

## 2023-04-12 DIAGNOSIS — Z951 Presence of aortocoronary bypass graft: Secondary | ICD-10-CM

## 2023-04-15 ENCOUNTER — Telehealth: Payer: Self-pay

## 2023-04-15 ENCOUNTER — Encounter: Payer: Self-pay | Admitting: Physician Assistant

## 2023-04-15 ENCOUNTER — Other Ambulatory Visit (HOSPITAL_BASED_OUTPATIENT_CLINIC_OR_DEPARTMENT_OTHER): Payer: Self-pay

## 2023-04-15 ENCOUNTER — Ambulatory Visit (INDEPENDENT_AMBULATORY_CARE_PROVIDER_SITE_OTHER): Payer: Self-pay | Admitting: Physician Assistant

## 2023-04-15 ENCOUNTER — Ambulatory Visit
Admission: RE | Admit: 2023-04-15 | Discharge: 2023-04-15 | Disposition: A | Discharge status: Home / Self Care | Source: Ambulatory Visit | Attending: Thoracic Surgery (Cardiothoracic Vascular Surgery)

## 2023-04-15 VITALS — BP 113/78 | HR 75 | Resp 18 | Ht 71.0 in | Wt 214.0 lb

## 2023-04-15 DIAGNOSIS — Z951 Presence of aortocoronary bypass graft: Secondary | ICD-10-CM | POA: Diagnosis not present

## 2023-04-15 DIAGNOSIS — Z9189 Other specified personal risk factors, not elsewhere classified: Secondary | ICD-10-CM

## 2023-04-15 DIAGNOSIS — Z79899 Other long term (current) drug therapy: Secondary | ICD-10-CM

## 2023-04-15 MED ORDER — POTASSIUM CHLORIDE CRYS ER 20 MEQ PO TBCR
20.0000 meq | EXTENDED_RELEASE_TABLET | Freq: Every day | ORAL | 3 refills | Status: AC
  Filled 2023-04-15: qty 90, 90d supply, fill #0
  Filled 2023-07-09: qty 90, 90d supply, fill #1
  Filled 2023-10-08: qty 90, 90d supply, fill #2
  Filled 2024-01-08: qty 90, 90d supply, fill #3

## 2023-04-15 MED ORDER — FUROSEMIDE 40 MG PO TABS
40.0000 mg | ORAL_TABLET | Freq: Every day | ORAL | 3 refills | Status: AC
  Filled 2023-04-15: qty 90, 90d supply, fill #0
  Filled 2023-07-09: qty 90, 90d supply, fill #1
  Filled 2023-10-08 (×2): qty 90, 90d supply, fill #2
  Filled 2024-01-08: qty 90, 90d supply, fill #3

## 2023-04-15 NOTE — Telephone Encounter (Signed)
 Spoke with pt. Pt was notified of lab results. Pt will start Lasix 40 mg and potassium 20 mEq daily. Pt will repeat BMET & BNP as directed.

## 2023-04-22 DIAGNOSIS — Z9189 Other specified personal risk factors, not elsewhere classified: Secondary | ICD-10-CM | POA: Diagnosis not present

## 2023-04-22 DIAGNOSIS — Z79899 Other long term (current) drug therapy: Secondary | ICD-10-CM | POA: Diagnosis not present

## 2023-04-23 LAB — BASIC METABOLIC PANEL WITH GFR
BUN/Creatinine Ratio: 15 (ref 10–24)
BUN: 15 mg/dL (ref 8–27)
CO2: 27 mmol/L (ref 20–29)
Calcium: 9.5 mg/dL (ref 8.6–10.2)
Chloride: 100 mmol/L (ref 96–106)
Creatinine, Ser: 1.01 mg/dL (ref 0.76–1.27)
Glucose: 164 mg/dL — ABNORMAL HIGH (ref 70–99)
Potassium: 3.5 mmol/L (ref 3.5–5.2)
Sodium: 142 mmol/L (ref 134–144)
eGFR: 82 mL/min/{1.73_m2} (ref >59)

## 2023-05-13 ENCOUNTER — Other Ambulatory Visit (HOSPITAL_BASED_OUTPATIENT_CLINIC_OR_DEPARTMENT_OTHER): Payer: Self-pay

## 2023-05-13 ENCOUNTER — Telehealth: Payer: Self-pay | Admitting: *Deleted

## 2023-05-13 NOTE — Telephone Encounter (Signed)
 Patient contacted the office c/o left shoulder and arm pain that comes and goes. States pain does not radiate. States pain is at the front and back of his left shoulder. Describes pain as a "nagging tooth ache". Per patient, Tylenol  helps the pain. Patient states he noticed discomfort starting about 1 week ago. Advised patient to contact PCP regarding shoulder pain. Patient verbalized understanding.

## 2023-05-17 ENCOUNTER — Encounter (HOSPITAL_BASED_OUTPATIENT_CLINIC_OR_DEPARTMENT_OTHER): Payer: Self-pay

## 2023-05-17 ENCOUNTER — Emergency Department (HOSPITAL_BASED_OUTPATIENT_CLINIC_OR_DEPARTMENT_OTHER): Admission: EM | Admit: 2023-05-17 | Discharge: 2023-05-17 | Disposition: A | Discharge status: Home / Self Care

## 2023-05-17 ENCOUNTER — Other Ambulatory Visit (HOSPITAL_BASED_OUTPATIENT_CLINIC_OR_DEPARTMENT_OTHER): Payer: Self-pay

## 2023-05-17 ENCOUNTER — Encounter: Payer: Self-pay | Admitting: Family Medicine

## 2023-05-17 ENCOUNTER — Other Ambulatory Visit: Payer: Self-pay

## 2023-05-17 ENCOUNTER — Emergency Department (HOSPITAL_BASED_OUTPATIENT_CLINIC_OR_DEPARTMENT_OTHER)

## 2023-05-17 ENCOUNTER — Ambulatory Visit (INDEPENDENT_AMBULATORY_CARE_PROVIDER_SITE_OTHER): Admitting: Family Medicine

## 2023-05-17 ENCOUNTER — Telehealth: Payer: Self-pay

## 2023-05-17 VITALS — BP 132/86 | HR 70 | Temp 98.0°F | Resp 16 | Ht 71.0 in | Wt 222.0 lb

## 2023-05-17 DIAGNOSIS — I10 Essential (primary) hypertension: Secondary | ICD-10-CM | POA: Insufficient documentation

## 2023-05-17 DIAGNOSIS — R079 Chest pain, unspecified: Secondary | ICD-10-CM

## 2023-05-17 DIAGNOSIS — Z7982 Long term (current) use of aspirin: Secondary | ICD-10-CM | POA: Diagnosis not present

## 2023-05-17 DIAGNOSIS — I2089 Other forms of angina pectoris: Secondary | ICD-10-CM

## 2023-05-17 DIAGNOSIS — Z7902 Long term (current) use of antithrombotics/antiplatelets: Secondary | ICD-10-CM | POA: Insufficient documentation

## 2023-05-17 DIAGNOSIS — I209 Angina pectoris, unspecified: Secondary | ICD-10-CM | POA: Diagnosis not present

## 2023-05-17 DIAGNOSIS — Z79899 Other long term (current) drug therapy: Secondary | ICD-10-CM | POA: Diagnosis not present

## 2023-05-17 DIAGNOSIS — I25110 Atherosclerotic heart disease of native coronary artery with unstable angina pectoris: Secondary | ICD-10-CM

## 2023-05-17 DIAGNOSIS — I25118 Atherosclerotic heart disease of native coronary artery with other forms of angina pectoris: Secondary | ICD-10-CM | POA: Diagnosis not present

## 2023-05-17 DIAGNOSIS — E119 Type 2 diabetes mellitus without complications: Secondary | ICD-10-CM | POA: Diagnosis not present

## 2023-05-17 LAB — CBC
HCT: 43.3 % (ref 39.0–52.0)
HCT: 42.1 % (ref 39.0–52.0)
Hemoglobin: 13.9 g/dL (ref 13.0–17.0)
Hemoglobin: 13.9 g/dL (ref 13.0–17.0)
MCH: 27.4 pg (ref 26.0–34.0)
MCHC: 32.1 g/dL (ref 30.0–36.0)
MCHC: 33 g/dL (ref 30.0–36.0)
MCV: 83.9 fl (ref 78.0–100.0)
MCV: 82.9 fL (ref 80.0–100.0)
Platelets: 130 10*3/uL — ABNORMAL LOW (ref 150.0–400.0)
Platelets: 133 10*3/uL — ABNORMAL LOW (ref 150–400)
RBC: 5.16 Mil/uL (ref 4.22–5.81)
RBC: 5.08 MIL/uL (ref 4.22–5.81)
RDW: 15.9 % — ABNORMAL HIGH (ref 11.5–15.5)
RDW: 15.1 % (ref 11.5–15.5)
WBC: 4.2 10*3/uL (ref 4.0–10.5)
WBC: 4.3 10*3/uL (ref 4.0–10.5)
nRBC: 0 % (ref 0.0–0.2)

## 2023-05-17 LAB — COMPREHENSIVE METABOLIC PANEL WITH GFR
ALT: 16 U/L (ref 0–53)
AST: 15 U/L (ref 0–37)
Albumin: 4.2 g/dL (ref 3.5–5.2)
Alkaline Phosphatase: 70 U/L (ref 39–117)
BUN: 13 mg/dL (ref 6–23)
CO2: 33 meq/L — ABNORMAL HIGH (ref 19–32)
Calcium: 9.2 mg/dL (ref 8.4–10.5)
Chloride: 101 meq/L (ref 96–112)
Creatinine, Ser: 0.95 mg/dL (ref 0.40–1.50)
GFR: 82.53 mL/min (ref >60.00)
Glucose, Bld: 116 mg/dL — ABNORMAL HIGH (ref 70–99)
Potassium: 4.1 meq/L (ref 3.5–5.1)
Sodium: 140 meq/L (ref 135–145)
Total Bilirubin: 0.6 mg/dL (ref 0.2–1.2)
Total Protein: 7.1 g/dL (ref 6.0–8.3)

## 2023-05-17 LAB — BASIC METABOLIC PANEL WITH GFR
Anion gap: 10 (ref 5–15)
BUN: 12 mg/dL (ref 8–23)
CO2: 28 mmol/L (ref 22–32)
Calcium: 9.3 mg/dL (ref 8.9–10.3)
Chloride: 103 mmol/L (ref 98–111)
Creatinine, Ser: 0.99 mg/dL (ref 0.61–1.24)
GFR, Estimated: >60 mL/min (ref >60)
Glucose, Bld: 116 mg/dL — ABNORMAL HIGH (ref 70–99)
Potassium: 3.9 mmol/L (ref 3.5–5.1)
Sodium: 141 mmol/L (ref 135–145)

## 2023-05-17 LAB — MAGNESIUM: Magnesium: 2 mg/dL (ref 1.5–2.5)

## 2023-05-17 LAB — TROPONIN T, HIGH SENSITIVITY
Troponin T High Sensitivity: 20 ng/L — ABNORMAL HIGH (ref <19)
Troponin T High Sensitivity: 19 ng/L — ABNORMAL HIGH (ref <19)

## 2023-05-17 MED ORDER — NITROGLYCERIN 0.4 MG SL SUBL
0.4000 mg | SUBLINGUAL_TABLET | SUBLINGUAL | 3 refills | Status: AC | PRN
  Filled 2023-05-17: qty 25, 8d supply, fill #0
  Filled 2024-02-03: qty 25, 8d supply, fill #1

## 2023-05-17 NOTE — ED Provider Notes (Signed)
 [facility] [place] Provider Note   CSN: [id number] Arrival date & time: 05/17/23  1540     History  No chief complaint on file.   Vincent [name] is a 68 y.o. male.  Exam overall reassuring  The history is provided by the patient and medical records. No language interpreter was used.     68 year old male with history of CAD status post CABG x 4, hypertension, diabetes sent here to the ER from PCP office for evaluation of chest pain.  Patient states about 8 weeks ago he had a quadruple bypass.  For the past 2-week he has had intermittent pain in his chest.  He described pain as a toothache pressure sensation to his chest with some social shortness of breath and pain does radiates to his left arm usually brought on by exertion and would resolve when he rest.  Pain usually lasting for a few minutes.  Pain never comes on with rest.  There are no associated fever or chills no productive cough no nausea lightheadedness abdominal pain no back pain.  Last episode was yesterday when he was helping his friend move.  He did reach out to his cardiologist at 1 point who instructed patient to come to see his PCP.  He did set an appointment to see his PCP today and initially when he evaluated by his PCP he was told that his EKG was normal and he can go home however he received a phone call later stating that he should at least come to the ER to get checked out.  He does not endorse any active chest pain.  He has no other complaint.  Home Medications Prior to Admission medications   Medication Sig Start Date End Date Taking? Authorizing Provider  amLODIPine -Valsartan -HCTZ 10-320-25 MG TABS Take 1 tablet by mouth daily. 03/09/23   Barrett, Malachy Scripture, PA-C  aspirin  EC 81 MG tablet Take 1 tablet (81 mg total) by mouth daily. Swallow whole. 03/09/23   Barrett, Erin R, PA-C  atorvastatin  (LIPITOR ) 80 MG tablet Take 1 tablet (80 mg total) by mouth daily at 6 PM. Patient taking  differently: Take 80 mg by mouth in the morning. 07/17/22 07/17/23  Jobe Mulder, DO  clopidogrel  (PLAVIX ) 75 MG tablet Take 1 tablet (75 mg total) by mouth daily. 03/09/23   Barrett, Erin R, PA-C  furosemide  (LASIX ) 40 MG tablet Take 1 tablet (40 mg total) by mouth daily. 04/15/23 07/15/23  Jude Norton, NP  metoprolol  tartrate (LOPRESSOR ) 50 MG tablet Take 1 tablet (50 mg total) by mouth 2 (two) times daily. 03/09/23   Barrett, Malachy Scripture, PA-C  nitroGLYCERIN  (NITROSTAT ) 0.4 MG SL tablet Place 1 tablet (0.4 mg total) under the tongue every 5 (five) minutes as needed for chest pain. 05/17/23   Jobe Mulder, DO  oxybutynin  (DITROPAN  XL) 5 MG 24 hr tablet Take 1 tablet (5 mg total) by mouth in the morning. 03/09/23   Barrett, Erin R, PA-C  oxyCODONE  (OXY IR/ROXICODONE ) 5 MG immediate release tablet Take 1 tablet (5 mg total) by mouth every 6 (six) hours as needed for severe pain (pain score 7-10). 03/09/23   Barrett, Erin R, PA-C  potassium chloride  SA (KLOR-CON  M) 20 MEQ tablet Take 1 tablet (20 mEq total) by mouth daily. 04/15/23   Jude Norton, NP  tamsulosin  (FLOMAX ) 0.4 MG CAPS capsule Take 1 capsule (0.4 mg total) by mouth daily. 07/18/22   Jobe Mulder, DO  Allergies    Patient has no known allergies.    Review of Systems   Review of Systems  All other systems reviewed and are negative.   Physical Exam Updated Vital Signs BP (!) 178/86   Pulse (!) 51   Temp 98.7 F (37.1 C)   Resp 17   Ht 5\' 11"  (1.803 m)   Wt 100.7 kg   SpO2 98%   BMI 30.96 kg/m  Physical Exam Vitals and nursing note reviewed.  Constitutional:      General: He is not in acute distress.    Appearance: He is well-developed.  HENT:     Head: Atraumatic.  Eyes:     Conjunctiva/sclera: Conjunctivae normal.  Cardiovascular:     Rate and Rhythm: Normal rate and regular rhythm.     Pulses: Normal pulses.     Heart sounds: Normal heart sounds. No murmur heard. Pulmonary:     Effort:  Pulmonary effort is normal.     Breath sounds: Normal breath sounds.  Abdominal:     Palpations: Abdomen is soft.     Tenderness: There is no abdominal tenderness.  Musculoskeletal:        General: Normal range of motion.     Cervical back: Neck supple.  Skin:    Capillary Refill: Capillary refill takes less than 2 seconds.     Findings: No rash.  Neurological:     Mental Status: He is alert. Mental status is at baseline.     ED Results / Procedures / Treatments   Labs (all labs ordered are listed, but only abnormal results are displayed) Labs Reviewed  BASIC METABOLIC PANEL WITH GFR - Abnormal; Notable for the following components:      Result Value   Glucose, Bld 116 (*)    All other components within normal limits  CBC - Abnormal; Notable for the following components:   Platelets 133 (*)    All other components within normal limits  TROPONIN T, HIGH SENSITIVITY - Abnormal; Notable for the following components:   Troponin T High Sensitivity 20 (*)    All other components within normal limits  TROPONIN T, HIGH SENSITIVITY - Abnormal; Notable for the following components:   Troponin T High Sensitivity 19 (*)    All other components within normal limits    EKG EKG Interpretation Date/Time:  Friday May 17 2023 09:44:34 EDT Ventricular Rate:  58 PR Interval:  164 QRS Duration:  100 QT Interval:  424 QTC Calculation: 417 R Axis:   106  Text Interpretation: Sinus rhythm Right axis deviation Abnormal T, consider ischemia, lateral leads Confirmed by Elise Guile [phone]) on 05/17/2023 9:54:02 AM  Radiology DG Chest 2 View Result Date: 05/17/2023 CLINICAL DATA:  Chest pain EXAM: CHEST - 2 VIEW COMPARISON:  04/15/2018 FINDINGS: Cardiac shadow is stable. Postsurgical changes are again noted. Lungs are clear bilaterally. No bony abnormality is seen. IMPRESSION: No active cardiopulmonary disease. Electronically Signed   By: Violeta Grey M.D.   On: 05/17/2023 11:27     Procedures Procedures    Medications Ordered in ED Medications - No data to display  ED Course/ Medical Decision Making/ A&P                                 Medical Decision Making Amount and/or Complexity of Data Reviewed Labs: ordered. Radiology: ordered.   BP (!) 185/94   Pulse (!) 57   Temp  98.7 F (37.1 C) (Oral)   Resp 20   Ht 5\' 11"  (1.803 m)   Wt 100.7 kg   SpO2 99%   BMI 30.96 kg/m   36:16 AM  68 year old male with history of CAD status post CABG x 4, hypertension, diabetes sent here to the ER from PCP office for evaluation of chest pain.  Patient states about 8 weeks ago he had a quadruple bypass.  For the past 2-week he has had intermittent pain in his chest.  He described pain as a toothache pressure sensation to his chest with some social shortness of breath and pain does radiates to his left arm usually brought on by exertion and would resolve when he rest.  Pain usually lasting for a few minutes.  Pain never comes on with rest.  There are no associated fever or chills no productive cough no nausea lightheadedness abdominal pain no back pain.  Last episode was yesterday when he was helping his friend move.  He did reach out to his cardiologist at 1 point who instructed patient to come to see his PCP.  He did set an appointment to see his PCP today and initially when he evaluated by his PCP he was told that his EKG was normal and he can go home however he received a phone call later stating that he should at least come to the ER to get checked out.  He does not endorse any active chest pain.  He has no other complaint.  Is resting comfortably appears to be in no acute discomfort.  Heart with normal rate rhythm, lungs clear to auscultation bilaterally abdomen is soft nontender intact pulses throughout bilateral upper extremities.  Vitals are notable for elevated blood pressure of 185/94.  Symptoms suggestive of stable angina.  Plan to obtain delta troponin, cardiac  workup, and will consult cardiology for recommendation.  EMR review patient last echo shows an EF of 45 to 50%.  His heart catheterization done earlier this year review a 75% left main stenosis, 60% posterior LAD, 60 to 90% D1, 40% LCx, 95% mid RCA, 50% dRCA, 100% acute marginal stenosis and he underwent CABG x 4.  -Labs ordered, independently viewed and interpreted by me.  Labs remarkable for initial troponin of 20 -The patient was maintained on a cardiac monitor.  I personally viewed and interpreted the cardiac monitored which showed an underlying rhythm of: Sinus rhythm -Imaging independently viewed and interpreted by me and I agree with radiologist's interpretation.  Result remarkable for chest x-ray without acute finding -This patient presents to the ED for concern of chest pain, this involves an extensive number of treatment options, and is a complaint that carries with it a high risk of complications and morbidity.  The differential diagnosis includes ACS, angina, unstable angina, PE, pneumonia, costochondritis, GERD, MSK -Co morbidities that complicate the patient evaluation includes CAD -Treatment includes monitoring -Reevaluation of the patient after these medicines showed that the patient resolved -PCP office notes or outside notes reviewed -Discussion with specialist on call cardiologist who recommend obtain delta trop and if it's flat pt can f/u outpt with his card -Escalation to admission/observation considered: patients feels much better, is comfortable with discharge, and will follow up with cardiology -Prescription medication considered, patient comfortable with home medication including sublingual nitro as needed -Social Determinant of Health considered which includes social isolation         Final Clinical Impression(s) / ED Diagnoses Final diagnoses:  Angina of effort (HCC)  Rx / DC Orders ED Discharge Orders     None         Debbra Fairy, PA-C 05/17/23  1406    Rolinda Climes, [place] 05/17/23 1507

## 2023-05-17 NOTE — Patient Instructions (Addendum)
 If things get worse, please go to the ER.   EKG looks similar as it did 2 months ago at your cardiologist's office.   Please contact your heart doctor to be on the safe side.  [phone]   Let us  know if you need anything.
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
## 2023-05-17 NOTE — Discharge Instructions (Signed)
 You have been evaluated for your symptoms.  Fortunately no concerning findings were noted on today's exam.  If you develop similar chest pain, do not hesitate to take nitroglycerin  sublingual tablet that was recently prescribed and if your pain persist please return for further evaluation.  Follow-up closely with your cardiologist for further care.

## 2023-05-17 NOTE — Telephone Encounter (Signed)
 Called pt was advised and Pt told he needed to be seen in ER per Dr.Wendling.

## 2023-05-17 NOTE — ED Triage Notes (Addendum)
 Patient arrives ambulatory to the ED with complaints of intermittent chest discomfort with exertion x2 weeks. Patient recently had a CABG ~2 months ago as well. Sent here by his PCP for further evaluation. No chest pain on arrival.

## 2023-05-17 NOTE — ED Notes (Signed)
 ED Provider at bedside.

## 2023-05-20 ENCOUNTER — Telehealth: Payer: Self-pay | Admitting: Nurse Practitioner

## 2023-05-20 NOTE — Telephone Encounter (Signed)
   Pt c/o of Chest Pain: STAT if active CP, including tightness, pressure, jaw pain, radiating pain to shoulder/upper arm/back, CP unrelieved by Nitro. Symptoms reported of SOB, nausea, vomiting, sweating.  1. Are you having CP right now? no    2. Are you experiencing any other symptoms (ex. SOB, nausea, vomiting, sweating)? Left arm pain, sob   3. Is your CP continuous or coming and going? Coming and going   4. Have you taken Nitroglycerin ? no   5. How long have you been experiencing CP? 2 weeks Went to ER 5/9, chest pain on exertion   6. If NO CP at time of call then end call with telling Pt to call back or call 911 if Chest pain returns prior to return call from triage team.

## 2023-05-20 NOTE — Telephone Encounter (Signed)
 Spoke with pt regarding his symptoms. Pt stated he has been having chest pain, left arm pain and sob, all only with exertion for about 2-3 weeks. Pt stated the symptoms occur while doing activities such as working around the house of mowing the lawn. Pt stated that when he rests the pain goes away. Pt has nitroglycerin  to use from his last hospital stay. Pt was told to use it if he has chest pain. Pt was told that if he has chest pain that radiates down his arm and sob that he should report to the ED. Pt verbalized understanding. All questions if any were answered. A message was left for the pt to call back to be added to the schedule today at 3pm.

## 2023-05-22 ENCOUNTER — Other Ambulatory Visit: Payer: Self-pay | Admitting: Physician Assistant

## 2023-05-22 ENCOUNTER — Other Ambulatory Visit (HOSPITAL_BASED_OUTPATIENT_CLINIC_OR_DEPARTMENT_OTHER): Payer: Self-pay

## 2023-05-22 NOTE — Telephone Encounter (Signed)
 Spoke with Dr. Krasowski regarding the next steps for this patient and after reviewing the chart he recommended scheduling an appointment for Dr. Krasowski to see the patient. Attempted to call the patient and schedule an appointment for him, but he did not answer the phone and a message was left for him to call the office back.

## 2023-05-23 ENCOUNTER — Other Ambulatory Visit (HOSPITAL_BASED_OUTPATIENT_CLINIC_OR_DEPARTMENT_OTHER): Payer: Self-pay

## 2023-05-24 NOTE — Telephone Encounter (Signed)
 Called the patient and scheduled an appointment on 5/19 for him to see [name] regarding his chest pain per Dr. Tonja Fray recommendation. Patient verbalized understanding and had no further questions at this time.

## 2023-05-26 NOTE — Progress Notes (Signed)
 Cardiology Office Note:  .   Date:  05/27/2023  ID:  Vincent [name], DOB 1955-07-07, MRN [id number] PCP: Jobe Mulder, DO  [facility] Providers Cardiologist:  Zoe Hinds, MD    History of Present Illness: .   Vincent [name] is a 68 y.o. male with a past medical history of CAD s/p STEMI in 2018  >> s/p CABG x 4 2025, hypertension, DM 2, dyslipidemia, mild bilateral carotid artery stenosis, OSA on CPAP.  03/05/2023 CABG x 4  LIMA to LAD, SVG to Diagonal, SVG to OM, SVG to PDA  02/26/2013 echo EF 45 to 50%, positive RWMA, grade 1 DD, mild MR, mild aortic regurgitation 02/27/2023 cardiac cath multivessel CAD with recommendations for TCTS evaluation 11/26/2016 echo EF 55 to 60%, positive RWMA, grade 1 ED 11/26/2016 cardiac cath nonobstructive CAD  In 2018 he had a STEMI, left heart cath at that time revealed nonobstructive CAD.  In 2025 he presented to the emergency department with worsening chest pain that have been increasing with frequency, he ruled in for non-STEMI, troponin peaked at 430, he underwent cardiac cath revealing severe left main disease.  TCTS was consulted and he underwent CABG x 4, had an overall uncomplicated hospitalization/routine following bypass. He presented to his PCP on 05/17/2023 for evaluation of chest pain, there was some atypical features however given his recent bypass and CVD history he was advised to proceed to the emergency department.  Evaluated in the emergency department on 05/17/2023 for evaluation of chest pain after he was evaluated by his PCP, he was hypertensive, troponin 20 >> 19, no EKG changes advised to follow-up with outpatient cardiology.   He presents today with episodes of chest discomfort.  Started approximately 3 weeks ago, sound to be consistent with angina, he was evaluated by his PCP and advised to follow-up in the ED, workup was unrevealing.  He has needed his nitroglycerin  on a few occasion, this did relieve the symptoms.  He states  with exertion, he gets a dull sensation in his left shoulder, radiated up into his neck.  Currently, he is chest pain-free. He denies palpitations, dyspnea, pnd, orthopnea, n, v, dizziness, syncope, edema, weight gain, or early satiety.   ROS: Review of Systems  Cardiovascular:  Positive for chest pain.  All other systems reviewed and are negative.    Studies Reviewed: [name]   EKG Interpretation Date/Time:  Monday May 27 2023 11:17:32 EDT Ventricular Rate:  72 PR Interval:  162 QRS Duration:  104 QT Interval:  408 QTC Calculation: 446 R Axis:   66  Text Interpretation: Normal sinus rhythm Right atrial enlargement ST & T wave abnormality, consider anterolateral ischemia When compared with ECG of 17-May-2023 09:44, PREVIOUS ECG IS PRESENT Confirmed by [name] [phone]) on 05/27/2023 1:08:03 PM    Cardiac Studies & Procedures   ______________________________________________________________________________________________ CARDIAC CATHETERIZATION  CARDIAC CATHETERIZATION 02/27/2023  Conclusion   Dist RCA lesion is 50% stenosed.  1.  Severe 75% proximal left main stenosis 2.  Moderate diffuse proximal LAD stenosis with severe first diagonal stenosis, unchanged from previous cath study 3.  Mild nonobstructive left circumflex stenosis 4.  Critical proximal 95% stenosis of a dominant RCA with total occlusion of the acute marginal branch and mild diffuse distal vessel disease with a graftable PDA 5.  Mild segmental LV dysfunction with basal inferior akinesis and LVEF estimated at 45 to 50%  Recommendations: Cardiac surgical consultation for CABG in the setting of left main and multivessel CAD.  Resume IV  heparin  2 hours after TR band off.  Findings Coronary Findings Diagnostic  Dominance: Right  Left Main Ost LM to Mid LM lesion is 75% stenosed.  Left Anterior Descending Prox LAD lesion is 60% stenosed.  First Diagonal Branch Ost 1st Diag lesion is 60% stenosed. Ost 1st Diag to  1st Diag lesion is 90% stenosed.  Left Circumflex Prox Cx to Mid Cx lesion is 40% stenosed.  Right Coronary Artery Prox RCA to Mid RCA lesion is 95% stenosed. Dist RCA lesion is 50% stenosed.  Acute Marginal Branch Acute Mrg lesion is 100% stenosed.  Intervention  No interventions have been documented.   CARDIAC CATHETERIZATION  CARDIAC CATHETERIZATION 11/25/2016  Conclusion Images from the original result were not included.   Ost 1st Diag lesion is 60% stenosed.  Ost 1st Diag to 1st Diag lesion is 90% stenosed.  Prox LAD lesion is 50% stenosed.  Ost LM lesion is 40% stenosed.  Prox RCA to Mid RCA lesion is 40% stenosed.  The left ventricular systolic function is normal.  LV end diastolic pressure is normal.  The left ventricular ejection fraction is greater than 65% by visual estimate.  Willliam [name] is a 68 y.o. male   [id number] LOCATION:  FACILITY: [facility] PHYSICIAN: Lauro Portal, M.D. 12-Jun-1955   DATE OF PROCEDURE:  11/25/2016  DATE OF DISCHARGE:     CARDIAC CATHETERIZATION    History obtained from chart review. Vincent [name] is a 68 year old mild to moderately overweight engaged African-American male father of 4 children who has a history of hypertension but no prior cardiac history. He developed chest pain, prostate 5:30 this afternoon. He took Cialis  2 hours prior to that. He presented to [facility] where his EKG was noted to have 1 mm of ST segment elevation in leads 1 and L with reciprocal depression in the inferior leads. He was treated with IV heparin  and aspirin  and was transported for urgent cardiac catheterization.   IMPRESSION:Mr. Copes has noncritical disease in his RCA and LAD. He does have a high-grade lesion in a relatively small to medium sized diagonal branch in the proximal to midportion. The ostium and proximal portion had diffuse 60% stenosis that was approximately 1.75 mm. The distal diagonal branch. Stenosis was about  2-2.25 mm. The patient was pain-free at the end of the case. His LV function was normal. At this point I recommend medical therapy with 24 hours of heparin  and antiplatelet therapy. She'll return chest pain attempt could be made to perform PTA plus or minus stenting. It should be noted that the patient did have a moderate ostial left main stenosis in the 40%. The sheath was removed and a TR band was placed on the right wrist which is patent hemostasis. The patient left the lab in stable condition. He will be treated with routine post-MI pharmacology including hypo-to statin drug, beta-blockade and ACE inhibition in addition to dual antibiotic therapy. He left the lab in stable condition.   Lauro Portal. MD, [facility] 11/25/2016 7:25 PM  Findings Coronary Findings Diagnostic  Dominance: Right  Left Main Ost LM lesion is 40% stenosed.  Left Anterior Descending Prox LAD lesion is 50% stenosed.  First Diagonal Branch Ost 1st Diag lesion is 60% stenosed. Ost 1st Diag to 1st Diag lesion is 90% stenosed.  Right Coronary Artery Prox RCA to Mid RCA lesion is 40% stenosed.  Intervention  No interventions have been documented.     ECHOCARDIOGRAM  ECHOCARDIOGRAM COMPLETE 02/27/2023  Narrative ECHOCARDIOGRAM REPORT  Patient Name:   Berdell Hostetler Date of Exam: 02/27/2023 Medical Rec #:  [id number]     Height:       71.0 in Accession #:    [id number]    Weight:       219.8 lb Date of Birth:  05/29/1955      BSA:          2.195 m Patient Age:    67 years      BP:           147/84 mmHg Patient Gender: M             HR:           66 bpm. Exam Location:  Inpatient  Procedure: 2D Echo, Cardiac Doppler and Color Doppler (Both Spectral and Color Flow Doppler were utilized during procedure).  Indications:    NSTEMI I21.4  History:        Patient has no prior history of Echocardiogram examinations. Previous Myocardial Infarction and CAD; Risk Factors:Diabetes and  Hypertension.  Sonographer:    Astrid Blamer Referring Phys: Gale Jude  IMPRESSIONS   1. Left ventricular ejection fraction, by estimation, is 45 to 50%. The left ventricle has mildly decreased function. The left ventricle demonstrates regional wall motion abnormalities (see scoring diagram/findings for description). Left ventricular diastolic parameters are consistent with Grade I diastolic dysfunction (impaired relaxation). 2. Right ventricular systolic function is normal. The right ventricular size is normal. 3. The mitral valve is normal in structure. Mild mitral valve regurgitation. No evidence of mitral stenosis. 4. The aortic valve is tricuspid. Aortic valve regurgitation is mild. No aortic stenosis is present. Aortic valve area, by VTI measures 2.14 cm. Aortic valve mean gradient measures 4.0 mmHg. Aortic valve Vmax measures 1.37 m/s. 5. The inferior vena cava is normal in size with greater than 50% respiratory variability, suggesting right atrial pressure of 3 mmHg.  FINDINGS Left Ventricle: Left ventricular ejection fraction, by estimation, is 45 to 50%. The left ventricle has mildly decreased function. The left ventricle demonstrates regional wall motion abnormalities. Strain imaging was not performed. The left ventricular internal cavity size was normal in size. There is no left ventricular hypertrophy. Left ventricular diastolic parameters are consistent with Grade I diastolic dysfunction (impaired relaxation).   LV Wall Scoring: The basal inferolateral segment, apical lateral segment, mid anterolateral segment, and basal inferior segment are akinetic. The basal anterolateral segment is normal.  Right Ventricle: The right ventricular size is normal. No increase in right ventricular wall thickness. Right ventricular systolic function is normal.  Left Atrium: Left atrial size was normal in size.  Right Atrium: Right atrial size was normal in size.  Pericardium: There  is no evidence of pericardial effusion.  Mitral Valve: The mitral valve is normal in structure. Mild mitral valve regurgitation. No evidence of mitral valve stenosis.  Tricuspid Valve: The tricuspid valve is normal in structure. Tricuspid valve regurgitation is trivial. No evidence of tricuspid stenosis.  Aortic Valve: The aortic valve is tricuspid. Aortic valve regurgitation is mild. No aortic stenosis is present. Aortic valve mean gradient measures 4.0 mmHg. Aortic valve peak gradient measures 7.5 mmHg. Aortic valve area, by VTI measures 2.14 cm.  Pulmonic Valve: The pulmonic valve was normal in structure. Pulmonic valve regurgitation is not visualized. No evidence of pulmonic stenosis.  Aorta: The aortic root is normal in size and structure.  Venous: The inferior vena cava is normal in size with greater than 50% respiratory variability, suggesting right atrial  pressure of 3 mmHg.  IAS/Shunts: No atrial level shunt detected by color flow Doppler.  Additional Comments: 3D imaging was not performed.   LEFT VENTRICLE PLAX 2D LVIDd:         5.90 cm   Diastology LVIDs:         3.90 cm   LV e' medial:    4.57 cm/s LV PW:         1.10 cm   LV E/e' medial:  15.8 LV IVS:        1.00 cm   LV e' lateral:   9.68 cm/s LVOT diam:     2.00 cm   LV E/e' lateral: 7.5 LV SV:         66 LV SV Index:   30 LVOT Area:     3.14 cm   RIGHT VENTRICLE RV S prime:     10.60 cm/s TAPSE (M-mode): 2.0 cm  LEFT ATRIUM             Index        RIGHT ATRIUM           Index LA Vol (A2C):   60.5 ml 27.56 ml/m  RA Area:     13.40 cm LA Vol (A4C):   51.9 ml 23.65 ml/m  RA Volume:   33.00 ml  15.04 ml/m LA Biplane Vol: 59.2 ml 26.97 ml/m AORTIC VALVE AV Area (Vmax):    2.09 cm AV Area (Vmean):   2.19 cm AV Area (VTI):     2.14 cm AV Vmax:           137.00 cm/s AV Vmean:          101.000 cm/s AV VTI:            0.308 m AV Peak Grad:      7.5 mmHg AV Mean Grad:      4.0 mmHg LVOT Vmax:          91.00 cm/s LVOT Vmean:        70.300 cm/s LVOT VTI:          0.210 m LVOT/AV VTI ratio: 0.68  AORTA Ao Root diam: 3.60 cm  MITRAL VALVE               TRICUSPID VALVE MV Area (PHT): 3.40 cm    TR Peak grad:   24.4 mmHg MV Decel Time: 223 msec    TR Vmax:        247.00 cm/s MV E velocity: 72.30 cm/s MV A velocity: 89.10 cm/s  SHUNTS MV E/A ratio:  0.81        Systemic VTI:  0.21 m Systemic Diam: 2.00 cm  Dorothye Gathers MD Electronically signed by Dorothye Gathers MD Signature Date/Time: 02/27/2023/1:35:07 PM    Final   TEE  ECHO INTRAOPERATIVE TEE 03/04/2023  Narrative *INTRAOPERATIVE TRANSESOPHAGEAL REPORT *    Patient Name:   LEELYNN WHETSEL  Date of Exam: 03/04/2023 Medical Rec #:  [id number]      Height:       72.0 in Accession #:    [id number]     Weight:       214.8 lb Date of Birth:  01-05-1956       BSA:          2.20 m Patient Age:    67 years       BP:           130/75 mmHg Patient Gender: M  HR:           77 bpm. Exam Location:  Anesthesiology  Transesophogeal exam was perform intraoperatively during surgical procedure. Patient was closely monitored under general anesthesia during the entirety of examination.  Indications:     CABG Performing Phys: [id number] Marinell Siad LIGHTFOOT Diagnosing Phys: Arlyne Lame MD  Complications: No known complications during this procedure. POST-OP IMPRESSIONS _ Left Ventricle: LVEF 45-50%. No significant changes to previous wall motion abnormalities. _ Right Ventricle: The right ventricle appears unchanged from pre-bypass. _ Aorta: No dissection noted after cannula removed. _ Left Atrium: The left atrium appears unchanged from pre-bypass. _ Left Atrial Appendage: The left atrial appendage appears unchanged from pre-bypass. _ Aortic Valve: The aortic valve appears unchanged from pre-bypass. _ Mitral Valve: The mitral valve appears unchanged from pre-bypass. _ Tricuspid Valve: The tricuspid valve appears unchanged from  pre-bypass. _ Pulmonic Valve: The pulmonic valve appears unchanged from pre-bypass. _ Interatrial Septum: The interatrial septum appears unchanged from pre-bypass. _ Interventricular Septum: The interventricular septum appears unchanged from pre-bypass. _ Pericardium: The pericardium appears unchanged from pre-bypass.  PRE-OP FINDINGS Left Ventricle: The left ventricle has mild-moderately reduced systolic function, with an ejection fraction of 40-45%. The cavity size was normal. There is no increase in left ventricular wall thickness. There is no left ventricular hypertrophy. Left ventricular diastolic function could was not evaluated. Mild to moderate hypokiness of the septal, inferior and lateral wall.  Right Ventricle: The right ventricle has normal systolic function. The cavity was normal. There is no increase in right ventricular wall thickness. There is no aneurysm seen.  Left Atrium: Left atrial size was normal in size. No left atrial/left atrial appendage thrombus was detected. Left atrial appendage velocity is normal at greater than 40 cm/s.  Right Atrium: Right atrial size was normal in size.  Interatrial Septum: No atrial level shunt detected by color flow Doppler. There is no evidence of a patent foramen ovale.  Pericardium: There is no evidence of pericardial effusion. There is no pleural effusion.  Mitral Valve: The mitral valve is normal in structure. Mitral valve regurgitation is mild by color flow Doppler. The MR jet is centrally-directed. There is no evidence of mitral valve vegetation. There is no evidence of mitral stenosis.  Tricuspid Valve: The tricuspid valve was normal in structure. Tricuspid valve regurgitation is trivial by color flow Doppler. There is no evidence of tricuspid valve vegetation.  Aortic Valve: The aortic valve is tricuspid, minimal calcification with normal leaflet motion. Aortic valve regurgitation is mild by color flow Doppler. There is no stenosis  of the aortic valve. There is no evidence of aortic valve vegetation.  Pulmonic Valve: The pulmonic valve was normal in structure. Pulmonic valve regurgitation is not visualized by color flow Doppler.   Aorta: The aortic root, ascending aorta and aortic arch are normal in size and structure.  Pulmonary Artery: The pulmonary artery is of normal size.  Shunts: There is no evidence of an atrial septal defect.   Arlyne Lame MD Electronically signed by Arlyne Lame MD Signature Date/Time: 03/04/2023/1:49:52 PM    Final        ______________________________________________________________________________________________      Risk Assessment/Calculations:             Physical Exam:   VS:  BP 136/78   Pulse 72   Ht 5\' 11"  (1.803 m)   Wt 216 lb (98 kg)   SpO2 96%   BMI 30.13 kg/m    Wt Readings from Last  3 Encounters:  05/27/23 216 lb (98 kg)  05/17/23 222 lb 0.1 oz (100.7 kg)  05/17/23 222 lb (100.7 kg)    GEN: Well nourished, well developed in no acute distress NECK: No JVD; No carotid bruits CARDIAC: RRR, no murmurs, rubs, gallops RESPIRATORY:  Clear to auscultation without rales, wheezing or rhonchi  ABDOMEN: Soft, non-tender, non-distended EXTREMITIES:  No edema; No deformity   ASSESSMENT AND PLAN: .   Coronary artery disease-STEMI in 2018 >> non-STEMI in 2025 with subsequent CABG x 4.  He is having symptoms today that are consistent with stable angina.  He did have a period between his bypass surgery up to 3 weeks ago where he had no symptoms and had largely returned to all his normal activities.  He has needed nitroglycerin  on 3 separate occasions which relieved the pain.  I discussed with DOD Dr. Gordan Latina, we will restart him on Plavix  75 mg daily, start low-dose Imdur  30 mg each day, and arrange for Lexiscan for further evaluation.  We did review with him when to present to the ED for chest pain.  Continue aspirin  81 mg daily, continue Lipitor  80 mg daily, continue  metoprolol  50 mg twice daily.  HFmrEF - NYHA class I, euvolemic.   Hypertension-blood pressure slightly elevated today, continue amlodipine -valsartan -HCTZ.  At next office visit if his blood pressure is still elevated we will have him keep a blood pressure log.  Carotid artery stenosis-noted on workup for CABG surgery, currently on aspirin  81 mg daily, restart Plavix  75 mg daily, continue Lipitor  80 mg daily.  Dyslipidemia-most recent LDL was 66.  Continue Lipitor  80 mg daily.      Informed Consent   Shared Decision Making/Informed Consent The risks [chest pain, shortness of breath, cardiac arrhythmias, dizziness, blood pressure fluctuations, myocardial infarction, stroke/transient ischemic attack, nausea, vomiting, allergic reaction, radiation exposure, metallic taste sensation and life-threatening complications (estimated to be 1 in 10,000)], benefits (risk stratification, diagnosing coronary artery disease, treatment guidance) and alternatives of a nuclear stress test were discussed in detail with Mr. Burgoon and he agrees to proceed.     Dispo:  Restart Plavix  75 mg daily, start Imdur  30 mg daily, Lexiscan. Follow up with Dr. Sandee Crook in 6 weeks.   Signed, Terrance Ferretti, NP

## 2023-05-27 ENCOUNTER — Encounter: Payer: Self-pay | Admitting: Cardiology

## 2023-05-27 ENCOUNTER — Other Ambulatory Visit (HOSPITAL_BASED_OUTPATIENT_CLINIC_OR_DEPARTMENT_OTHER): Payer: Self-pay

## 2023-05-27 ENCOUNTER — Ambulatory Visit: Attending: Cardiology | Admitting: Cardiology

## 2023-05-27 VITALS — BP 136/78 | HR 72 | Ht 71.0 in | Wt 216.0 lb

## 2023-05-27 DIAGNOSIS — E669 Obesity, unspecified: Secondary | ICD-10-CM

## 2023-05-27 DIAGNOSIS — Z951 Presence of aortocoronary bypass graft: Secondary | ICD-10-CM | POA: Diagnosis not present

## 2023-05-27 DIAGNOSIS — I2510 Atherosclerotic heart disease of native coronary artery without angina pectoris: Secondary | ICD-10-CM

## 2023-05-27 DIAGNOSIS — I6523 Occlusion and stenosis of bilateral carotid arteries: Secondary | ICD-10-CM

## 2023-05-27 DIAGNOSIS — I255 Ischemic cardiomyopathy: Secondary | ICD-10-CM

## 2023-05-27 DIAGNOSIS — E1169 Type 2 diabetes mellitus with other specified complication: Secondary | ICD-10-CM

## 2023-05-27 DIAGNOSIS — R072 Precordial pain: Secondary | ICD-10-CM | POA: Diagnosis not present

## 2023-05-27 DIAGNOSIS — E119 Type 2 diabetes mellitus without complications: Secondary | ICD-10-CM

## 2023-05-27 DIAGNOSIS — E785 Hyperlipidemia, unspecified: Secondary | ICD-10-CM | POA: Diagnosis not present

## 2023-05-27 MED ORDER — ISOSORBIDE MONONITRATE ER 30 MG PO TB24
30.0000 mg | ORAL_TABLET | Freq: Every day | ORAL | 3 refills | Status: DC
  Filled 2023-05-27: qty 90, 90d supply, fill #0

## 2023-05-27 MED ORDER — CLOPIDOGREL BISULFATE 75 MG PO TABS
75.0000 mg | ORAL_TABLET | Freq: Every day | ORAL | 3 refills | Status: AC
  Filled 2023-05-27: qty 90, 90d supply, fill #0
  Filled 2023-10-08: qty 90, 90d supply, fill #1
  Filled 2024-01-08: qty 90, 90d supply, fill #2

## 2023-05-27 NOTE — Patient Instructions (Signed)
 Medication Instructions:  Your physician has recommended you make the following change in your medication:   Restart Plavix   Start Imdur  30 mg daily  *If you need a refill on your cardiac medications before your next appointment, please call your pharmacy*   Lab Work: None ordered If you have labs (blood work) drawn today and your tests are completely normal, you will receive your results only by: MyChart Message (if you have MyChart) OR A paper copy in the mail If you have any lab test that is abnormal or we need to change your treatment, we will call you to review the results.   Testing/Procedures: You are scheduled for a Myocardial Perfusion Imaging Study.  Please arrive 15 minutes prior to your appointment time for registration and insurance purposes.  The test will take approximately 3 to 4 hours to complete; you may bring reading material.  If someone comes with you to your appointment, they will need to remain in the main lobby due to limited space in the testing area.   How to prepare for your Myocardial Perfusion Test: Do not eat or drink 3 hours prior to your test, except you may have water. Do not consume products containing caffeine (regular or decaffeinated) 12 hours prior to your test. (ex: coffee, chocolate, sodas, tea). Do bring a list of your current medications with you.  If not listed below, you may take your medications as normal. Do wear comfortable clothes (no dresses or overalls) and walking shoes, tennis shoes preferred (No heels or open toe shoes are allowed). Do NOT wear cologne, perfume, aftershave, or lotions (deodorant is allowed). If these instructions are not followed, your test will have to be rescheduled.  If you cannot keep your appointment, please provide 24 hours notification to the Nuclear Lab, to avoid a possible $50 charge to your account.  Follow-Up: At [facility], you and your health needs are our priority.  As part of our  continuing mission to provide you with exceptional heart care, we have created designated Provider Care Teams.  These Care Teams include your primary Cardiologist (physician) and Advanced Practice Providers (APPs -  Physician Assistants and Nurse Practitioners) who all work together to provide you with the care you need, when you need it.  We recommend signing up for the patient portal called "MyChart".  Sign up information is provided on this After Visit Summary.  MyChart is used to connect with patients for Virtual Visits (Telemedicine).  Patients are able to view lab/test results, encounter notes, upcoming appointments, etc.  Non-urgent messages can be sent to your provider as well.   To learn more about what you can do with MyChart, go to [email or web address].    Your next appointment:   6 week(s)  Provider:   Zoe Hinds, MD   Other Instructions  Cardiac Nuclear Scan A cardiac nuclear scan is a test that is done to check the flow of blood to your heart. It is done when you are resting and when you are exercising. The test looks for problems such as: Not enough blood reaching a portion of the heart. The heart muscle not working as it should. You may need this test if you have: Heart disease. Lab results that are not normal. Had heart surgery or a balloon procedure to open up blocked arteries (angioplasty) or a small mesh tube (stent). Chest pain. Shortness of breath. Had a heart attack. In this test, a special dye (tracer) is put into your bloodstream. The tracer  will travel to your heart. A camera will then take pictures of your heart to see how the tracer moves through your heart. This test is usually done at a hospital and takes 2-4 hours. Tell a doctor about: Any allergies you have. All medicines you are taking, including vitamins, herbs, eye drops, creams, and over-the-counter medicines. Any bleeding problems you have. Any surgeries you have had. Any medical conditions you  have. Whether you are pregnant or may be pregnant. Any history of asthma or long-term (chronic) lung disease. Any history of heart rhythm disorders or heart valve conditions. What are the risks? Your doctor will talk with you about risks. These may include: Serious chest pain and heart attack. This is only a risk if the stress portion of the test is done. Fast or uneven heartbeats (palpitations). A feeling of warmth in your chest. This feeling usually does not last long. Allergic reaction to the tracer. Shortness of breath or trouble breathing. What happens before the test? Ask your doctor about changing or stopping your normal medicines. Follow instructions from your doctor about what you cannot eat or drink. Remove your jewelry on the day of the test. Ask your doctor if you need to avoid nicotine or caffeine. What happens during the test? An IV tube will be inserted into one of your veins. Your doctor will give you a small amount of tracer through the IV tube. You will wait for 20-40 minutes while the tracer moves through your bloodstream. Your heart will be monitored with an electrocardiogram (ECG). You will lie down on an exam table. Pictures of your heart will be taken for about 15-20 minutes. You may also have a stress test. For this test, one of these things may be done: You will be asked to exercise on a treadmill or a stationary bike. You will be given medicines that will make your heart work harder. This is done if you are unable to exercise. When blood flow to your heart has peaked, a tracer will again be given through the IV tube. After 20-40 minutes, you will get back on the exam table. More pictures will be taken of your heart. Depending on the tracer that is used, more pictures may need to be taken 3-4 hours later. Your IV tube will be removed when the test is over. The test may vary among doctors and hospitals. What happens after the test? Ask your doctor: Whether you  can return to your normal schedule, including diet, activities, travel, and medicines. Whether you should drink more fluids. This will help to remove the tracer from your body. Ask your doctor, or the department that is doing the test: When will my results be ready? How will I get my results? What are my treatment options? What other tests do I need? What are my next steps? This information is not intended to replace advice given to you by your health care provider. Make sure you discuss any questions you have with your health care provider. Document Revised: 05/23/2021 Document Reviewed: 05/23/2021 Elsevier Patient Education  2023 [employer].

## 2023-05-28 ENCOUNTER — Telehealth: Payer: Self-pay | Admitting: *Deleted

## 2023-05-28 NOTE — Telephone Encounter (Signed)
 Pt given instructions for MPI study.

## 2023-06-05 ENCOUNTER — Ambulatory Visit: Attending: Cardiology

## 2023-06-05 DIAGNOSIS — R072 Precordial pain: Secondary | ICD-10-CM

## 2023-06-05 MED ORDER — TECHNETIUM TC 99M TETROFOSMIN IV KIT
10.7000 | PACK | Freq: Once | INTRAVENOUS | Status: AC | PRN
  Administered 2023-06-05: 10.7 via INTRAVENOUS

## 2023-06-05 MED ORDER — REGADENOSON 0.4 MG/5ML IV SOLN
0.4000 mg | Freq: Once | INTRAVENOUS | Status: AC
  Administered 2023-06-05: 0.4 mg via INTRAVENOUS

## 2023-06-05 MED ORDER — TECHNETIUM TC 99M TETROFOSMIN IV KIT
32.0000 | PACK | Freq: Once | INTRAVENOUS | Status: AC | PRN
  Administered 2023-06-05: 32 via INTRAVENOUS

## 2023-06-06 ENCOUNTER — Ambulatory Visit (HOSPITAL_BASED_OUTPATIENT_CLINIC_OR_DEPARTMENT_OTHER): Payer: Self-pay | Admitting: Family

## 2023-06-06 LAB — MYOCARDIAL PERFUSION IMAGING
LV dias vol: 178 mL (ref 62–150)
LV sys vol: 100 mL
Nuc Stress EF: 44 %
Peak HR: 80 {beats}/min
Rest HR: 68 {beats}/min
Rest Nuclear Isotope Dose: 10.7 mCi
SDS: 7
SRS: 2
SSS: 9
ST Depression (mm): 0 mm
Stress Nuclear Isotope Dose: 32 mCi
TID: 1.03

## 2023-06-10 NOTE — Progress Notes (Signed)
 Received the following message below from Neomi Banks, NP:  "Stress test intermediate risk. There was portion of moderate ischemia and reduced heart muscle function. Heart muscle function difficult to accurately assess on stress test, recommend echocardiogram for further evaluation. At last visit, Imdur  and Plavix  were added to GDMT of Metoprolol , Lipitor , Aspirin . Next steps will be dependent on present symptoms after escalation of medication therapy.  Will ask nursing team to (1) order echo (2) schedule sooner follow up within 2 weeks with Dr. Sandee Crook or APP for re-evaluation for symptoms".    Called the patient and informed him of the recommendations from Neomi Banks NP. Patient verbalized understanding and had no further questions at this time. An echo was ordered via Epic.

## 2023-06-11 ENCOUNTER — Ambulatory Visit: Attending: Nurse Practitioner

## 2023-06-11 DIAGNOSIS — I255 Ischemic cardiomyopathy: Secondary | ICD-10-CM

## 2023-06-11 DIAGNOSIS — I351 Nonrheumatic aortic (valve) insufficiency: Secondary | ICD-10-CM

## 2023-06-11 DIAGNOSIS — I340 Nonrheumatic mitral (valve) insufficiency: Secondary | ICD-10-CM | POA: Diagnosis not present

## 2023-06-11 LAB — ECHOCARDIOGRAM COMPLETE
AR max vel: 3.05 cm2
AV Area VTI: 3.27 cm2
AV Area mean vel: 3.02 cm2
AV Mean grad: 4 mmHg
AV Peak grad: 8.6 mmHg
Ao pk vel: 1.47 m/s
Area-P 1/2: 4.21 cm2
S' Lateral: 3.51 cm

## 2023-06-13 ENCOUNTER — Other Ambulatory Visit (HOSPITAL_BASED_OUTPATIENT_CLINIC_OR_DEPARTMENT_OTHER): Payer: Self-pay

## 2023-06-13 ENCOUNTER — Ambulatory Visit

## 2023-06-13 VITALS — BP 140/80 | HR 84 | Ht 71.6 in | Wt 218.0 lb

## 2023-06-13 DIAGNOSIS — I5042 Chronic combined systolic (congestive) and diastolic (congestive) heart failure: Secondary | ICD-10-CM

## 2023-06-13 DIAGNOSIS — R9439 Abnormal result of other cardiovascular function study: Secondary | ICD-10-CM

## 2023-06-13 DIAGNOSIS — I509 Heart failure, unspecified: Secondary | ICD-10-CM | POA: Insufficient documentation

## 2023-06-13 DIAGNOSIS — I25112 Atherosclerotic heart disease of native coronary artery with refractory angina pectoris: Secondary | ICD-10-CM

## 2023-06-13 DIAGNOSIS — I10 Essential (primary) hypertension: Secondary | ICD-10-CM | POA: Diagnosis not present

## 2023-06-13 MED ORDER — CARVEDILOL 12.5 MG PO TABS
12.5000 mg | ORAL_TABLET | Freq: Two times a day (BID) | ORAL | 3 refills | Status: AC
  Filled 2023-06-13: qty 180, 90d supply, fill #0
  Filled 2023-09-13 (×3): qty 180, 90d supply, fill #1
  Filled 2024-02-03: qty 180, 90d supply, fill #2

## 2023-06-13 MED ORDER — EMPAGLIFLOZIN 10 MG PO TABS
10.0000 mg | ORAL_TABLET | Freq: Every day | ORAL | 3 refills | Status: AC
  Filled 2023-06-13: qty 90, 90d supply, fill #0
  Filled 2023-09-13 (×2): qty 90, 90d supply, fill #1
  Filled 2024-02-03: qty 90, 90d supply, fill #2

## 2023-06-13 MED ORDER — ENTRESTO 97-103 MG PO TABS
1.0000 | ORAL_TABLET | Freq: Two times a day (BID) | ORAL | 3 refills | Status: AC
  Filled 2023-06-13: qty 180, 90d supply, fill #0
  Filled 2023-09-13: qty 180, 90d supply, fill #1
  Filled 2024-02-03: qty 60, 30d supply, fill #2

## 2023-06-13 MED ORDER — SPIRONOLACTONE 25 MG PO TABS
25.0000 mg | ORAL_TABLET | Freq: Every day | ORAL | 3 refills | Status: DC
  Filled 2023-06-13: qty 90, 90d supply, fill #0
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
## 2023-06-13 NOTE — Assessment & Plan Note (Signed)
 prior MI in 2018 At the time showed 90% ostial first diagonal lesion with other vessels being nonobstructive, was medically managed.  Subsequently February 2025 had NSTEMI with severe triple-vessel disease and LV dysfunction EF 45 to 50% on echocardiogram underwent CABG [LIMA-LAD, SVG-diagonal, SVG-OM, SVG-PDA] on 03-04-2023 with IntraOp TEE showing LVEF 40 to 45%, mild MR, mild aortic insufficiency.   Symptoms have been progressing over the past month. Symptoms of angina with mild to moderate exertional activities, requiring sublingual nitroglycerin .  Currently on antianginal regimen with metoprolol  tartrate, Imdur , amlodipine .  With progressive symptoms now with abnormal stress test with nuclear imaging discussed further management. Discussed escalating antianginal regimen further and discuss further evaluation with cardiac catheterization for any significant change from baseline.  He is agreeable to proceed with cardiac cath.  Shared Decision Making/Informed Consent{ The risks [stroke (1 in 1000), death (1 in 1000), kidney failure [usually temporary] (1 in 500), bleeding (1 in 200), allergic reaction [possibly serious] (1 in 200)], benefits (diagnostic support and management of coronary artery disease) and alternatives of a cardiac catheterization were discussed in detail with him and he is willing to proceed.  Tentatively to schedule at [facility].  Advised him to avoid any moderate to heavy exertional activities.  If symptoms persist after mild activity or not relieving with couple doses of sublingual nitroglycerin  should call 911 or head to the ER.   Continue aspirin  81 mg once daily Continue clopidogrel  75 mg once daily Continue atorvastatin  80 mg once daily.

## 2023-06-13 NOTE — Assessment & Plan Note (Addendum)
 Blood pressure suboptimal. Target blood pressure below 130/80 mmHg. Changing his medications as above discussed under CHF. Replacing metoprolol  and amlodipine /valsartan /hydrochlorothiazide  with Entresto, spironolactone and carvedilol.

## 2023-06-13 NOTE — Patient Instructions (Signed)
 Medication Instructions:  Your physician has recommended you make the following change in your medication:   STOP: Amlodipine / Valsartan / Hydrochlorothiazide  STOP: Metoprolol  tartrate START: Carvedilol 12.5 mg two times daily START: Entresto 97 - 103 mg two times daily START: Aldactone 25 mg daily START: Jardiance 10 mg daily  *If you need a refill on your cardiac medications before your next appointment, please call your pharmacy*  Lab Work: Your physician recommends that you return for lab work in:   Labs today: BMP, CBC  If you have labs (blood work) drawn today and your tests are completely normal, you will receive your results only by: MyChart Message (if you have MyChart) OR A paper copy in the mail If you have any lab test that is abnormal or we need to change your treatment, we will call you to review the results.  Testing/Procedures:  [facility] DEPT OF [facility] AT [facility] [place] [address] [place], [place] [address] [place] [address] Dept: [phone] Loc: [phone]  Vincent [name]  06/13/2023  You are scheduled for a Cardiac Catheterization on Thursday, June 12 with Dr. Fransico Ivy.  1. Please arrive at the [facility] (Main Entrance A) at [facility]: [address] [place], [place] [address] at 5:30 AM (This time is 2 hour(s) before your procedure to ensure your preparation).   Free valet parking service is available. You will check in at ADMITTING. The support person will be asked to wait in the waiting room.  It is OK to have someone drop you off and come back when you are ready to be discharged.    Special note: Every effort is made to have your procedure done on time. Please understand that emergencies sometimes delay scheduled procedures.  2. Diet: Do not eat solid foods after midnight.  The patient may have clear liquids until 5am upon the day of the procedure.  3. Labs: You  will need to have blood drawn on Thursday, June 5 at [facility]: [address], [address], [place]. You do not need to be fasting.  4. Medication instructions in preparation for your procedure:  Hold Lasix  and Aldactone the day of your cardiac cath.  Please also take Plavix  the day of your cardiac cath.    Contrast Allergy: No  On the morning of your procedure, take your Aspirin  81 mg and any morning medicines NOT listed above.  You may use sips of water.  5. Plan to go home the same day, you will only stay overnight if medically necessary. 6. Bring a current list of your medications and current insurance cards. 7. You MUST have a responsible person to drive you home. 8. Someone MUST be with you the first 24 hours after you arrive home or your discharge will be delayed. 9. Please wear clothes that are easy to get on and off and wear slip-on shoes.  Thank you for allowing us  to care for you!   -- [facility]   Follow-Up: At [facility], you and your health needs are our priority.  As part of our continuing mission to provide you with exceptional heart care, our providers are all part of one team.  This team includes your primary Cardiologist (physician) and Advanced Practice Providers or APPs (Physician Assistants and Nurse Practitioners) who all work together to provide you with the care you need, when you need it.  Your next appointment:  4 week(s)  Provider:   Bertha Broad, MD    We recommend signing up for the patient portal called "MyChart".  Sign up information is provided on this After Visit Summary.  MyChart is used to connect with patients for Virtual Visits (Telemedicine).  Patients are able to view lab/test results, encounter notes, upcoming appointments, etc.  Non-urgent messages can be sent to your provider as well.   To learn more about what you can do with MyChart, go to [email or web address].   Other  Instructions None

## 2023-06-13 NOTE — Assessment & Plan Note (Signed)
 Euvolemic, compensated, NYHA class II.  Functional status appears limited due to his antianginal symptoms.  -Continue with salt restriction to below 2 g/day. Continue with furosemide  40 mg once daily. He is also on potassium supplementation 20 mEq once daily.  From guideline-directed medical therapy standpoint I will change his medications as follows. - Stop metoprolol  and start Coreg 12.5 mg twice daily. - Stop his current combination medication amlodipine /valsartan /hydrochlorothiazide  - Start Entresto 97 mg - 103 mg twice daily. - Start spironolactone 25 mg once daily - Start Jardiance 10 mg once daily discussed about hygiene to avoid skin infections and perineal infections.

## 2023-06-16 ENCOUNTER — Ambulatory Visit: Payer: Self-pay | Admitting: Nurse Practitioner

## 2023-06-17 LAB — CBC
Hematocrit: 43.6 % (ref 37.5–51.0)
Hemoglobin: 13.8 g/dL (ref 13.0–17.7)
MCH: 27.2 pg (ref 26.6–33.0)
MCHC: 31.7 g/dL (ref 31.5–35.7)
MCV: 86 fL (ref 79–97)
Platelets: 148 10*3/uL — ABNORMAL LOW (ref 150–450)
RBC: 5.07 x10E6/uL (ref 4.14–5.80)
RDW: 15.3 % (ref 11.6–15.4)
WBC: 4.6 10*3/uL (ref 3.4–10.8)

## 2023-06-17 LAB — BASIC METABOLIC PANEL WITH GFR
BUN/Creatinine Ratio: 16 (ref 10–24)
BUN: 17 mg/dL (ref 8–27)
CO2: 22 mmol/L (ref 20–29)
Calcium: 9.3 mg/dL (ref 8.6–10.2)
Chloride: 105 mmol/L (ref 96–106)
Creatinine, Ser: 1.09 mg/dL (ref 0.76–1.27)
Glucose: 96 mg/dL (ref 70–99)
Potassium: 4.1 mmol/L (ref 3.5–5.2)
Sodium: 143 mmol/L (ref 134–144)
eGFR: 74 mL/min/{1.73_m2} (ref >59)

## 2023-06-18 ENCOUNTER — Telehealth: Payer: Self-pay | Admitting: *Deleted

## 2023-06-18 NOTE — Telephone Encounter (Signed)
 Cardiac Catheterization scheduled at [facility] for: Thursday June 20, 2023 7:30 AM Arrival time [facility] Main Entrance A at: 5:30 AM  Nothing to eat after midnight prior to procedure, clear liquids until 5 AM day of procedure  Medication instructions: -Hold:  Lasix /Spironolactone /KCl-AM of procedure  Jardiance -AM of procedure -Other usual morning medications can be taken with sips of water including aspirin  81 mg and Plavix  75 mg.  Plan to go home the same day, you will only stay overnight if medically necessary.  You must have responsible adult to drive you home.  Someone must be with you the first 24 hours after you arrive home.  Reviewed with patient and will send instructions via MyChart-pt request.   ]

## 2023-06-20 ENCOUNTER — Other Ambulatory Visit: Payer: Self-pay

## 2023-06-20 ENCOUNTER — Encounter (HOSPITAL_COMMUNITY)
Admission: RE | Disposition: A | Discharge status: Home / Self Care | Payer: Self-pay | Source: Home / Self Care | Attending: Cardiology

## 2023-06-20 ENCOUNTER — Ambulatory Visit (HOSPITAL_COMMUNITY)
Admission: RE | Admit: 2023-06-20 | Discharge: 2023-06-20 | Disposition: A | Discharge status: Home / Self Care | Attending: Cardiology | Admitting: Cardiology

## 2023-06-20 DIAGNOSIS — G4733 Obstructive sleep apnea (adult) (pediatric): Secondary | ICD-10-CM | POA: Diagnosis not present

## 2023-06-20 DIAGNOSIS — E785 Hyperlipidemia, unspecified: Secondary | ICD-10-CM | POA: Diagnosis not present

## 2023-06-20 DIAGNOSIS — I10 Essential (primary) hypertension: Secondary | ICD-10-CM | POA: Insufficient documentation

## 2023-06-20 DIAGNOSIS — E1169 Type 2 diabetes mellitus with other specified complication: Secondary | ICD-10-CM | POA: Insufficient documentation

## 2023-06-20 DIAGNOSIS — I25118 Atherosclerotic heart disease of native coronary artery with other forms of angina pectoris: Secondary | ICD-10-CM

## 2023-06-20 DIAGNOSIS — R9439 Abnormal result of other cardiovascular function study: Secondary | ICD-10-CM

## 2023-06-20 DIAGNOSIS — I712 Thoracic aortic aneurysm, without rupture, unspecified: Secondary | ICD-10-CM | POA: Insufficient documentation

## 2023-06-20 DIAGNOSIS — I25810 Atherosclerosis of coronary artery bypass graft(s) without angina pectoris: Secondary | ICD-10-CM | POA: Diagnosis not present

## 2023-06-20 HISTORY — PX: LEFT HEART CATH AND CORS/GRAFTS ANGIOGRAPHY: CATH118250

## 2023-06-20 LAB — GLUCOSE, CAPILLARY
Glucose-Capillary: 129 mg/dL — ABNORMAL HIGH (ref 70–99)
Glucose-Capillary: 101 mg/dL — ABNORMAL HIGH (ref 70–99)

## 2023-06-20 SURGERY — LEFT HEART CATH AND CORS/GRAFTS ANGIOGRAPHY
Anesthesia: LOCAL

## 2023-06-20 MED ORDER — VERAPAMIL HCL 2.5 MG/ML IV SOLN
INTRAVENOUS | Status: DC | PRN
  Administered 2023-06-20 (×2): 10 mL via INTRA_ARTERIAL

## 2023-06-20 MED ORDER — HEPARIN SODIUM (PORCINE) 1000 UNIT/ML IJ SOLN
INTRAMUSCULAR | Status: DC | PRN
  Administered 2023-06-20: 5000 [IU] via INTRAVENOUS

## 2023-06-20 MED ORDER — VERAPAMIL HCL 2.5 MG/ML IV SOLN
INTRAVENOUS | Status: AC
  Filled 2023-06-20: qty 2

## 2023-06-20 MED ORDER — MIDAZOLAM HCL 2 MG/2ML IJ SOLN
INTRAMUSCULAR | Status: DC | PRN
  Administered 2023-06-20: 1 mg via INTRAVENOUS

## 2023-06-20 MED ORDER — LIDOCAINE HCL (PF) 1 % IJ SOLN
INTRAMUSCULAR | Status: AC
  Filled 2023-06-20: qty 30

## 2023-06-20 MED ORDER — FENTANYL CITRATE (PF) 100 MCG/2ML IJ SOLN
INTRAMUSCULAR | Status: DC | PRN
Start: 2023-06-20 — End: 2023-06-20
  Administered 2023-06-20: 25 ug via INTRAVENOUS

## 2023-06-20 MED ORDER — SODIUM CHLORIDE 0.9 % IV SOLN
INTRAVENOUS | Status: DC | PRN
  Administered 2023-06-20: 10 mL/h via INTRAVENOUS

## 2023-06-20 MED ORDER — HEPARIN SODIUM (PORCINE) 1000 UNIT/ML IJ SOLN
INTRAMUSCULAR | Status: AC
  Filled 2023-06-20: qty 10

## 2023-06-20 MED ORDER — IOHEXOL 350 MG/ML SOLN
INTRAVENOUS | Status: DC | PRN
  Administered 2023-06-20: 155 mL

## 2023-06-20 MED ORDER — MIDAZOLAM HCL 2 MG/2ML IJ SOLN
INTRAMUSCULAR | Status: AC
  Filled 2023-06-20: qty 2

## 2023-06-20 MED ORDER — HEPARIN (PORCINE) IN NACL 1000-0.9 UT/500ML-% IV SOLN
INTRAVENOUS | Status: DC | PRN
  Administered 2023-06-20: 1000 mL

## 2023-06-20 MED ORDER — FENTANYL CITRATE (PF) 100 MCG/2ML IJ SOLN
INTRAMUSCULAR | Status: AC
Start: 2023-06-20 — End: 2023-06-20
  Filled 2023-06-20: qty 2

## 2023-06-20 MED ORDER — SODIUM CHLORIDE 0.9 % WEIGHT BASED INFUSION: 1.0000 mL/kg/h | INTRAVENOUS | Status: DC

## 2023-06-20 MED ORDER — SODIUM CHLORIDE 0.9 % WEIGHT BASED INFUSION: 3.0000 mL/kg/h | INTRAVENOUS | Status: AC

## 2023-06-20 MED ORDER — LIDOCAINE HCL (PF) 1 % IJ SOLN
INTRAMUSCULAR | Status: DC | PRN
  Administered 2023-06-20: 2 mL

## 2023-06-20 SURGICAL SUPPLY — 18 items
CATH INFINITI 5 FR 3DRC (CATHETERS) IMPLANT
CATH INFINITI 5 FR AR1 MOD (CATHETERS) IMPLANT
CATH INFINITI 5 FR IM (CATHETERS) IMPLANT
CATH INFINITI 5 FR LCB (CATHETERS) IMPLANT
CATH INFINITI 5 FR RCB (CATHETERS) IMPLANT
CATH INFINITI 5FR AL1 (CATHETERS) IMPLANT
CATH INFINITI 5FR MPB2 (CATHETERS) IMPLANT
CATH INFINITI 5FR MULTPACK ANG (CATHETERS) IMPLANT
CATH SUPER TORQUE PLUS 6F MPA1 (CATHETERS) IMPLANT
DEVICE RAD COMP TR BAND LRG (VASCULAR PRODUCTS) IMPLANT
ELECT DEFIB PAD ADLT CADENCE (PAD) IMPLANT
GLIDESHEATH SLEND A-KIT 6F 22G (SHEATH) IMPLANT
GUIDEWIRE INQWIRE 1.5J.035X260 (WIRE) IMPLANT
KIT SYRINGE INJ CVI SPIKEX1 (MISCELLANEOUS) IMPLANT
PACK CARDIAC CATHETERIZATION (CUSTOM PROCEDURE TRAY) ×1 IMPLANT
SET ATX-X65L (MISCELLANEOUS) IMPLANT
SHEATH PROBE COVER 6X72 (BAG) IMPLANT
STATION PROTECTION PRESSURIZED (MISCELLANEOUS) IMPLANT

## 2023-06-20 NOTE — Discharge Instructions (Signed)

## 2023-06-20 NOTE — Interval H&P Note (Signed)
 History and Physical Interval Note:  06/20/2023 7:42 AM  Vincent [name]  has presented today for surgery, with the diagnosis of cad.  The various methods of treatment have been discussed with the patient and family. After consideration of risks, benefits and other options for treatment, the patient has consented to  Procedure(s): LEFT HEART CATH AND CORS/GRAFTS ANGIOGRAPHY (N/A) as a surgical intervention.  The patient's history has been reviewed, patient examined, no change in status, stable for surgery.  I have reviewed the patient's chart and labs.  Questions were answered to the patient's satisfaction.     Avish Torry J Tamisha Nordstrom
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
## 2023-06-21 ENCOUNTER — Encounter (HOSPITAL_COMMUNITY): Payer: Self-pay | Admitting: Cardiology

## 2023-06-21 ENCOUNTER — Other Ambulatory Visit (HOSPITAL_BASED_OUTPATIENT_CLINIC_OR_DEPARTMENT_OTHER): Payer: Self-pay

## 2023-06-27 ENCOUNTER — Other Ambulatory Visit: Payer: Self-pay | Admitting: Family Medicine

## 2023-06-27 ENCOUNTER — Other Ambulatory Visit (HOSPITAL_BASED_OUTPATIENT_CLINIC_OR_DEPARTMENT_OTHER): Payer: Self-pay

## 2023-07-01 ENCOUNTER — Encounter (HOSPITAL_BASED_OUTPATIENT_CLINIC_OR_DEPARTMENT_OTHER): Payer: Self-pay

## 2023-07-01 ENCOUNTER — Other Ambulatory Visit (HOSPITAL_BASED_OUTPATIENT_CLINIC_OR_DEPARTMENT_OTHER): Payer: Self-pay

## 2023-07-08 ENCOUNTER — Ambulatory Visit: Admitting: Cardiology

## 2023-07-09 ENCOUNTER — Other Ambulatory Visit (HOSPITAL_BASED_OUTPATIENT_CLINIC_OR_DEPARTMENT_OTHER): Payer: Self-pay

## 2023-07-15 ENCOUNTER — Ambulatory Visit

## 2023-07-15 ENCOUNTER — Other Ambulatory Visit: Payer: Self-pay

## 2023-07-15 ENCOUNTER — Other Ambulatory Visit (HOSPITAL_BASED_OUTPATIENT_CLINIC_OR_DEPARTMENT_OTHER): Payer: Self-pay

## 2023-07-15 VITALS — BP 110/60 | HR 78 | Ht 71.5 in | Wt 215.0 lb

## 2023-07-15 DIAGNOSIS — E782 Mixed hyperlipidemia: Secondary | ICD-10-CM | POA: Diagnosis not present

## 2023-07-15 DIAGNOSIS — I5042 Chronic combined systolic (congestive) and diastolic (congestive) heart failure: Secondary | ICD-10-CM

## 2023-07-15 DIAGNOSIS — I25118 Atherosclerotic heart disease of native coronary artery with other forms of angina pectoris: Secondary | ICD-10-CM

## 2023-07-15 DIAGNOSIS — Z951 Presence of aortocoronary bypass graft: Secondary | ICD-10-CM

## 2023-07-15 DIAGNOSIS — I10 Essential (primary) hypertension: Secondary | ICD-10-CM

## 2023-07-15 MED ORDER — EPLERENONE 25 MG PO TABS
25.0000 mg | ORAL_TABLET | Freq: Every day | ORAL | 3 refills | Status: AC
  Filled 2023-07-15: qty 90, 90d supply, fill #0
  Filled 2023-10-08: qty 90, 90d supply, fill #1
  Filled 2024-01-08: qty 90, 90d supply, fill #2

## 2023-07-15 MED ORDER — ISOSORBIDE MONONITRATE ER 30 MG PO TB24
30.0000 mg | ORAL_TABLET | Freq: Every day | ORAL | 3 refills | Status: AC
  Filled 2023-07-15 – 2023-10-08 (×2): qty 90, 90d supply, fill #0
  Filled 2024-01-08: qty 90, 90d supply, fill #1
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
## 2023-07-15 NOTE — Patient Instructions (Addendum)
 Medication Instructions:  Your physician has recommended you make the following change in your medication:   Stop Spironolactone   Start Eplerenone  25 mg daily   *If you need a refill on your cardiac medications before your next appointment, please call your pharmacy*   Lab Work: None ordered If you have labs (blood work) drawn today and your tests are completely normal, you will receive your results only by: MyChart Message (if you have MyChart) OR A paper copy in the mail If you have any lab test that is abnormal or we need to change your treatment, we will call you to review the results.   Testing/Procedures: None ordered   Follow-Up: At [facility], you and your health needs are our priority.  As part of our continuing mission to provide you with exceptional heart care, we have created designated Provider Care Teams.  These Care Teams include your primary Cardiologist (physician) and Advanced Practice Providers (APPs -  Physician Assistants and Nurse Practitioners) who all work together to provide you with the care you need, when you need it.  We recommend signing up for the patient portal called MyChart.  Sign up information is provided on this After Visit Summary.  MyChart is used to connect with patients for Virtual Visits (Telemedicine).  Patients are able to view lab/test results, encounter notes, upcoming appointments, etc.  Non-urgent messages can be sent to your provider as well.   To learn more about what you can do with MyChart, go to [email or web address].    Your next appointment:   3 month(s)  The format for your next appointment:   In Person  Provider:   Alean Kobus, MD    Other Instructions none  Important Information About Sugar

## 2023-07-15 NOTE — Assessment & Plan Note (Signed)
 Continue with lipid-lowering therapy atorvastatin  80 mg once daily.

## 2023-07-15 NOTE — Assessment & Plan Note (Signed)
 BP's well controlled.  Continue current medications as discussed above under CHF and CAD

## 2023-07-15 NOTE — Assessment & Plan Note (Signed)
 prior MI in 2018 90% ostial first diagonal lesion with other vessels being nonobstructive, was medically managed.  February 2025 had NSTEMI with severe triple-vessel disease and LV dysfunction EF 45 to 50% on echocardiogram  underwent CABG [LIMA-LAD, SVG-diagonal, SVG-OM, SVG-PDA] on 03-04-2023 with IntraOp TEE showing LVEF 40 to 45%. Progressive symptoms of chest pain, stress test nuclear imaging showing ischemia 06/05/2023. TTE EF 35 to 40% 06-11-2023 Cardiac cath 06/20/2023: occluded SVG-diagonal graft which likely is cause of his underlying symptoms and there was no good target for intervention.  Severe native vessel disease with patent LIMA-LAD and SVG grafts to OM and PDA.  LVEDP was normal 3 mmHg   Continue with dual antiplatelet therapy aspirin  81 mg and Plavix  75 mg once daily for 12 months, after May 2026, switch to Plavix  alone 75 mg once daily.  Continue with atorvastatin  80 mg once daily.  Continue antianginal regimen. Has been off Imdur  for the past 3 days. Resume Imdur  30 mg once daily, if symptoms persist and titrate up Imdur  to 60 mg once daily. Continue carvedilol  12.5 mg twice daily.   Discussed with him further cardiac rehab to objectively assess his functional capacity and increase activity as tolerated.  He is agreeable.  Will place a referral.

## 2023-07-15 NOTE — Assessment & Plan Note (Signed)
 Euvolemic, compensated. NYHA class II. Continue salt restriction below 2 g/day. Continue furosemide  40 mg once daily. Continue with potassium supplementation 20 mEq once daily. Guideline directed medical therapy tolerating current medications. Continue carvedilol  12.5 mg twice daily and Entresto  97 mg - 103 mg twice daily Jardiance  10 mg once daily. Switch spironolactone  to eplerenone  25 mg once daily given his breast hypertrophy symptoms.

## 2023-07-16 ENCOUNTER — Other Ambulatory Visit (HOSPITAL_BASED_OUTPATIENT_CLINIC_OR_DEPARTMENT_OTHER): Payer: Self-pay

## 2023-07-16 ENCOUNTER — Telehealth (HOSPITAL_COMMUNITY): Payer: Self-pay | Admitting: *Deleted

## 2023-07-16 ENCOUNTER — Other Ambulatory Visit: Payer: Self-pay

## 2023-07-16 NOTE — Telephone Encounter (Signed)
 Received referral from Dr. Liborio for this pt to participate in Cardiac rehab s/p  NSTEMI and CABG 02/2023. Noted that previously at the time of discharge, indicated his preference for Cardiac rehab phase II was [place].  Referral sent to [facility] and pt was contacted and message left on 3/18.  Called pt to determine his preference of location.  Pt would like to participate at [facility] cardiac rehab phase II program.  Permission given to fax referral to [facility]. Referral faxed.  Saturnino Bernett PEAK, BSN Cardiac and Emergency planning/management officer

## 2023-07-18 ENCOUNTER — Telehealth: Payer: Self-pay

## 2023-07-18 NOTE — Telephone Encounter (Signed)
 Pt c/o medication issue:  1. Name of Medication: Oxybutynin    2. How are you currently taking this medication (dosage and times per day)? N/a   3. Are you having a reaction (difficulty breathing--STAT)? No   4. What is your medication issue? Pt states he thought Dr. Liborio told him he would prescribe this medication for him. Please advise.

## 2023-07-19 NOTE — Telephone Encounter (Signed)
 Patient is returning call.

## 2023-07-19 NOTE — Telephone Encounter (Signed)
 LVM to call regarding message.

## 2023-07-22 ENCOUNTER — Other Ambulatory Visit: Payer: Self-pay

## 2023-07-23 ENCOUNTER — Other Ambulatory Visit: Payer: Self-pay

## 2023-07-23 ENCOUNTER — Other Ambulatory Visit (HOSPITAL_BASED_OUTPATIENT_CLINIC_OR_DEPARTMENT_OTHER): Payer: Self-pay

## 2023-07-23 ENCOUNTER — Other Ambulatory Visit: Payer: Self-pay | Admitting: Family Medicine

## 2023-07-23 MED ORDER — OXYBUTYNIN CHLORIDE ER 5 MG PO TB24
5.0000 mg | ORAL_TABLET | Freq: Every morning | ORAL | 1 refills | Status: DC
  Filled 2023-07-23: qty 90, 90d supply, fill #0

## 2023-08-05 ENCOUNTER — Other Ambulatory Visit (HOSPITAL_BASED_OUTPATIENT_CLINIC_OR_DEPARTMENT_OTHER): Payer: Self-pay

## 2023-08-05 ENCOUNTER — Other Ambulatory Visit: Payer: Self-pay | Admitting: Family Medicine

## 2023-08-06 ENCOUNTER — Other Ambulatory Visit (HOSPITAL_BASED_OUTPATIENT_CLINIC_OR_DEPARTMENT_OTHER): Payer: Self-pay

## 2023-08-08 ENCOUNTER — Other Ambulatory Visit (HOSPITAL_BASED_OUTPATIENT_CLINIC_OR_DEPARTMENT_OTHER): Payer: Self-pay

## 2023-08-23 ENCOUNTER — Other Ambulatory Visit (HOSPITAL_BASED_OUTPATIENT_CLINIC_OR_DEPARTMENT_OTHER): Payer: Self-pay

## 2023-08-27 DIAGNOSIS — G4733 Obstructive sleep apnea (adult) (pediatric): Secondary | ICD-10-CM | POA: Diagnosis not present

## 2023-08-28 ENCOUNTER — Other Ambulatory Visit (HOSPITAL_BASED_OUTPATIENT_CLINIC_OR_DEPARTMENT_OTHER): Payer: Self-pay

## 2023-08-28 ENCOUNTER — Ambulatory Visit (INDEPENDENT_AMBULATORY_CARE_PROVIDER_SITE_OTHER): Admitting: Family Medicine

## 2023-08-28 ENCOUNTER — Encounter: Payer: Self-pay | Admitting: Family Medicine

## 2023-08-28 VITALS — BP 128/62 | HR 90 | Temp 98.0°F | Resp 16 | Ht 71.0 in | Wt 216.0 lb

## 2023-08-28 DIAGNOSIS — T50905A Adverse effect of unspecified drugs, medicaments and biological substances, initial encounter: Secondary | ICD-10-CM | POA: Diagnosis not present

## 2023-08-28 DIAGNOSIS — I25118 Atherosclerotic heart disease of native coronary artery with other forms of angina pectoris: Secondary | ICD-10-CM

## 2023-08-28 DIAGNOSIS — E669 Obesity, unspecified: Secondary | ICD-10-CM

## 2023-08-28 DIAGNOSIS — E1169 Type 2 diabetes mellitus with other specified complication: Secondary | ICD-10-CM

## 2023-08-28 NOTE — Patient Instructions (Addendum)
 Try to drink 55-60 oz of water daily outside of exercise.  If you do not hear anything about your referrals in the next 1-2 weeks, call our office and ask for an update.  Keep the diet clean and stay active.  We are done with the Flomax  and Ditropan  as it does not sound like you did well with them.   Foods that may reduce pain: 1) Ginger 2) Blueberries 3) Salmon 4) Pumpkin seeds 5) Dark chocolate 6) Turmeric 7) Tart cherries 8) Virgin olive oil 9) Chili peppers 10) Mint 11) Krill oil  Let us  know if you need anything.  Healthy Eating Plan Many factors influence your heart health, including eating and exercise habits. Heart (coronary) risk increases with abnormal blood fat (lipid) levels. Heart-healthy meal planning includes limiting unhealthy fats, increasing healthy fats, and making other small dietary changes. This includes maintaining a healthy body weight to help keep lipid levels within a normal range.  WHAT IS MY PLAN?  Your health care provider recommends that you: Drink a glass of water before meals to help with satiety. Eat slowly. An alternative to the water is to add Metamucil. This will help with satiety as well. It does contain calories, unlike water.  WHAT TYPES OF FAT SHOULD I CHOOSE? Choose healthy fats more often. Choose monounsaturated and polyunsaturated fats, such as olive oil and canola oil, flaxseeds, walnuts, almonds, and seeds. Eat more omega-3 fats. Good choices include salmon, mackerel, sardines, tuna, flaxseed oil, and ground flaxseeds. Aim to eat fish at least two times each week. Avoid foods with partially hydrogenated oils in them. These contain trans fats. Examples of foods that contain trans fats are stick margarine, some tub margarines, cookies, crackers, and other baked goods. If you are going to avoid a fat, this is the one to avoid!  WHAT GENERAL GUIDELINES DO I NEED TO FOLLOW? Check food labels carefully to identify foods with trans fats.  Avoid these types of options when possible. Fill one half of your plate with vegetables and green salads. Eat 4-5 servings of vegetables per day. A serving of vegetables equals 1 cup of raw leafy vegetables,  cup of raw or cooked cut-up vegetables, or  cup of vegetable juice. Fill one fourth of your plate with whole grains. Look for the word whole as the first word in the ingredient list. Fill one fourth of your plate with lean protein foods. Eat 4-5 servings of fruit per day. A serving of fruit equals one medium whole fruit,  cup of dried fruit,  cup of fresh, frozen, or canned fruit. Try to avoid fruits in cups/syrups as the sugar content can be high. Eat more foods that contain soluble fiber. Examples of foods that contain this type of fiber are apples, broccoli, carrots, beans, peas, and barley. Aim to get 20-30 g of fiber per day. Eat more home-cooked food and less restaurant, buffet, and fast food. Limit or avoid alcohol. Limit foods that are high in starch and sugar. Avoid fried foods when able. Cook foods by using methods other than frying. Baking, boiling, grilling, and broiling are all great options. Other fat-reducing suggestions include: Removing the skin from poultry. Removing all visible fats from meats. Skimming the fat off of stews, soups, and gravies before serving them. Steaming vegetables in water or broth. Lose weight if you are overweight. Losing just 5-10% of your initial body weight can help your overall health and prevent diseases such as diabetes and heart disease. Increase your consumption of  nuts, legumes, and seeds to 4-5 servings per week. One serving of dried beans or legumes equals  cup after being cooked, one serving of nuts equals 1 ounces, and one serving of seeds equals  ounce or 1 tablespoon.  WHAT ARE GOOD FOODS CAN I EAT? Grains Grainy breads (try to find bread that is 3 g of fiber per slice or greater), oatmeal, light popcorn. Whole-grain cereals.  Rice and pasta, including brown rice and those that are made with whole wheat. Edamame pasta is a great alternative to grain pasta. It has a higher protein content. Try to avoid significant consumption of white bread, sugary cereals, or pastries/baked goods.  Vegetables All vegetables. Cooked white potatoes do not count as vegetables.  Fruits All fruits, but limit pineapple and bananas as these fruits have a higher sugar content.  Meats and Other Protein Sources Lean, well-trimmed beef, veal, pork, and lamb. Chicken and [place] without skin. All fish and shellfish. Wild duck, rabbit, pheasant, and venison. Egg whites or low-cholesterol egg substitutes. Dried beans, peas, lentils, and tofu. Seeds and most nuts.  Dairy Low-fat or nonfat cheeses, including ricotta, string, and mozzarella. Skim or 1% milk that is liquid, powdered, or evaporated. Buttermilk that is made with low-fat milk. Nonfat or low-fat yogurt. Soy/Almond milk are good alternatives if you cannot handle dairy.  Beverages Water is the best for you. Sports drinks with less sugar are more desirable unless you are a highly active athlete.  Sweets and Desserts Sherbets and fruit ices. Honey, jam, marmalade, jelly, and syrups. Dark chocolate.  Eat all sweets and desserts in moderation.  Fats and Oils Nonhydrogenated (trans-free) margarines. Vegetable oils, including soybean, sesame, sunflower, olive, peanut, safflower, corn, canola, and cottonseed. Salad dressings or mayonnaise that are made with a vegetable oil. Limit added fats and oils that you use for cooking, baking, salads, and as spreads.  Other Cocoa powder. Coffee and tea. Most condiments.  The items listed above may not be a complete list of recommended foods or beverages. Contact your dietitian for more options.
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
## 2023-08-29 ENCOUNTER — Ambulatory Visit: Payer: Self-pay | Admitting: Family Medicine

## 2023-08-29 LAB — LIPID PANEL
Cholesterol: 154 mg/dL (ref 0–200)
HDL: 47.3 mg/dL (ref >39.00)
LDL Cholesterol: 78 mg/dL (ref 0–99)
NonHDL: 106.73
Total CHOL/HDL Ratio: 3
Triglycerides: 144 mg/dL (ref 0.0–149.0)
VLDL: 28.8 mg/dL (ref 0.0–40.0)

## 2023-08-29 LAB — COMPREHENSIVE METABOLIC PANEL WITH GFR
ALT: 16 U/L (ref 0–53)
AST: 17 U/L (ref 0–37)
Albumin: 4.3 g/dL (ref 3.5–5.2)
Alkaline Phosphatase: 69 U/L (ref 39–117)
BUN: 13 mg/dL (ref 6–23)
CO2: 28 meq/L (ref 19–32)
Calcium: 9.2 mg/dL (ref 8.4–10.5)
Chloride: 104 meq/L (ref 96–112)
Creatinine, Ser: 1.13 mg/dL (ref 0.40–1.50)
GFR: 66.88 mL/min (ref >60.00)
Glucose, Bld: 137 mg/dL — ABNORMAL HIGH (ref 70–99)
Potassium: 3.4 meq/L — ABNORMAL LOW (ref 3.5–5.1)
Sodium: 143 meq/L (ref 135–145)
Total Bilirubin: 0.7 mg/dL (ref 0.2–1.2)
Total Protein: 7.1 g/dL (ref 6.0–8.3)

## 2023-08-29 LAB — MICROALBUMIN / CREATININE URINE RATIO
Creatinine,U: 111 mg/dL
Microalb Creat Ratio: 31.2 mg/g — ABNORMAL HIGH (ref 0.0–30.0)
Microalb, Ur: 3.5 mg/dL — ABNORMAL HIGH (ref 0.0–1.9)

## 2023-08-29 LAB — HEMOGLOBIN A1C: Hgb A1c MFr Bld: 7.1 % — ABNORMAL HIGH (ref 4.6–6.5)

## 2023-08-30 ENCOUNTER — Telehealth (HOSPITAL_COMMUNITY): Payer: Self-pay

## 2023-08-30 NOTE — Telephone Encounter (Signed)
 Called patient regarding cardiac rehab, patient confirms he would like to attend CR in [facility]. Will ensure [facility] has his referral and will follow up with him.

## 2023-09-02 ENCOUNTER — Telehealth: Payer: Self-pay

## 2023-09-02 ENCOUNTER — Other Ambulatory Visit: Payer: Self-pay

## 2023-09-02 DIAGNOSIS — E876 Hypokalemia: Secondary | ICD-10-CM

## 2023-09-02 NOTE — Telephone Encounter (Signed)
 Copied from CRM #[id number]. Topic: General - Other >> Sep 02, 2023 10:08 AM Ismael A wrote: Reason for CRM: patient stating he missed a call from office  Called pt back lab appt scheduled for tomorrow  Repeat potassium.

## 2023-09-03 ENCOUNTER — Other Ambulatory Visit (INDEPENDENT_AMBULATORY_CARE_PROVIDER_SITE_OTHER)

## 2023-09-03 DIAGNOSIS — E876 Hypokalemia: Secondary | ICD-10-CM

## 2023-09-03 LAB — BASIC METABOLIC PANEL WITH GFR
BUN: 17 mg/dL (ref 7–25)
CO2: 27 mmol/L (ref 20–32)
Calcium: 9.3 mg/dL (ref 8.6–10.3)
Chloride: 106 mmol/L (ref 98–110)
Creat: 1.05 mg/dL (ref 0.70–1.35)
Glucose, Bld: 81 mg/dL (ref 65–99)
Potassium: 3.6 mmol/L (ref 3.5–5.3)
Sodium: 143 mmol/L (ref 135–146)
eGFR: 77 mL/min/1.73m2 (ref >=60)

## 2023-09-04 ENCOUNTER — Ambulatory Visit: Payer: Self-pay | Admitting: Family Medicine

## 2023-09-04 LAB — MAGNESIUM: Magnesium: 2.1 mg/dL (ref 1.5–2.5)

## 2023-09-13 ENCOUNTER — Other Ambulatory Visit (HOSPITAL_BASED_OUTPATIENT_CLINIC_OR_DEPARTMENT_OTHER): Payer: Self-pay

## 2023-10-03 ENCOUNTER — Other Ambulatory Visit: Payer: Self-pay

## 2023-10-03 ENCOUNTER — Other Ambulatory Visit (HOSPITAL_BASED_OUTPATIENT_CLINIC_OR_DEPARTMENT_OTHER): Payer: Self-pay

## 2023-10-03 ENCOUNTER — Encounter (HOSPITAL_BASED_OUTPATIENT_CLINIC_OR_DEPARTMENT_OTHER): Payer: Self-pay | Admitting: Emergency Medicine

## 2023-10-03 ENCOUNTER — Emergency Department (HOSPITAL_BASED_OUTPATIENT_CLINIC_OR_DEPARTMENT_OTHER)
Admission: EM | Admit: 2023-10-03 | Discharge: 2023-10-03 | Disposition: A | Discharge status: Home / Self Care | Attending: Emergency Medicine | Admitting: Emergency Medicine

## 2023-10-03 DIAGNOSIS — Z7901 Long term (current) use of anticoagulants: Secondary | ICD-10-CM | POA: Insufficient documentation

## 2023-10-03 DIAGNOSIS — Z951 Presence of aortocoronary bypass graft: Secondary | ICD-10-CM | POA: Insufficient documentation

## 2023-10-03 DIAGNOSIS — Z7982 Long term (current) use of aspirin: Secondary | ICD-10-CM | POA: Diagnosis not present

## 2023-10-03 DIAGNOSIS — I2510 Atherosclerotic heart disease of native coronary artery without angina pectoris: Secondary | ICD-10-CM | POA: Insufficient documentation

## 2023-10-03 DIAGNOSIS — B349 Viral infection, unspecified: Secondary | ICD-10-CM | POA: Diagnosis not present

## 2023-10-03 DIAGNOSIS — E119 Type 2 diabetes mellitus without complications: Secondary | ICD-10-CM | POA: Diagnosis not present

## 2023-10-03 DIAGNOSIS — R059 Cough, unspecified: Secondary | ICD-10-CM | POA: Diagnosis present

## 2023-10-03 LAB — RESP PANEL BY RT-PCR (RSV, FLU A&B, COVID)  RVPGX2
Influenza A by PCR: NEGATIVE
Influenza B by PCR: NEGATIVE
Resp Syncytial Virus by PCR: NEGATIVE
SARS Coronavirus 2 by RT PCR: NEGATIVE

## 2023-10-03 MED ORDER — FLUTICASONE PROPIONATE 50 MCG/ACT NA SUSP
2.0000 | Freq: Every day | NASAL | 0 refills | Status: AC
  Filled 2023-10-03: qty 16, 14d supply, fill #0

## 2023-10-03 MED ORDER — CETIRIZINE HCL 10 MG PO TABS
10.0000 mg | ORAL_TABLET | Freq: Every day | ORAL | 0 refills | Status: AC
  Filled 2023-10-03: qty 14, 14d supply, fill #0

## 2023-10-03 NOTE — ED Triage Notes (Signed)
 Pt reports sneezing, runny nose and night sweats since Friday, denies fever or [facility]

## 2023-10-03 NOTE — ED Provider Notes (Signed)
 [place] [facility] Provider Note   CSN: [id number] Arrival date & time: 10/03/23  1500     Patient presents with: No chief complaint on file.   Vincent [name] is a 68 y.o. male.   HPI  Patient is a 68 year old male with past medical history significant for CAD status post four-vessel CABG, HLD, sleep paralysis, DM2, other medical diseases  Patient presents emergency room today with complaints of coughing and sneezing over the past 2 days he states he is not been short of breath has not had a fever he does not have any chest pain.  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  No recent surgeries, hospitalization, long travel, hemoptysis, estrogen containing OCP, cancer history.  No unilateral leg swelling.  No history of PE or VTE.      Prior to Admission medications   Medication Sig Start Date End Date Taking? Authorizing Provider  cetirizine  (ZYRTEC  ALLERGY) 10 MG tablet Take 1 tablet (10 mg total) by mouth daily. 10/03/23  Yes Saagar Tortorella S, PA  fluticasone  (FLONASE ) 50 MCG/ACT nasal spray Place 2 sprays into both nostrils daily for 14 days. 10/03/23 10/17/23 Yes Terrez Ander S, PA  acetaminophen  (TYLENOL ) 500 MG tablet Take 500-1,000 mg by mouth every 6 (six) hours as needed for moderate pain (pain score 4-6).    [provider]  aspirin  EC 81 MG tablet Take 1 tablet (81 mg total) by mouth daily. Swallow whole. 03/09/23   Barrett, Rocky SAUNDERS, PA-C  atorvastatin  (LIPITOR ) 80 MG tablet Take 1 tablet (80 mg total) by mouth daily at 6 PM. 07/17/22 10/13/23  Wendling, Mabel Mt, DO  carvedilol  (COREG ) 12.5 MG tablet Take 1 tablet (12.5 mg total) by mouth 2 (two) times daily. 06/13/23   Madireddy, Alean SAUNDERS, MD  clopidogrel  (PLAVIX ) 75 MG tablet Take 1 tablet (75 mg total) by mouth daily. 05/27/23   Carlin Delon BROCKS, NP  empagliflozin  (JARDIANCE ) 10 MG TABS  tablet Take 1 tablet (10 mg total) by mouth daily before breakfast. 06/13/23   Madireddy, Alean SAUNDERS, MD  eplerenone  (INSPRA ) 25 MG tablet Take 1 tablet (25 mg total) by mouth daily. 07/15/23   Madireddy, Alean SAUNDERS, MD  furosemide  (LASIX ) 40 MG tablet Take 1 tablet (40 mg total) by mouth daily. 04/15/23 10/13/23  Daneen Damien BROCKS, NP  isosorbide  mononitrate (IMDUR ) 30 MG 24 hr tablet Take 1 tablet (30 mg total) by mouth daily. 07/15/23 10/13/23  Madireddy, Alean SAUNDERS, MD  nitroGLYCERIN  (NITROSTAT ) 0.4 MG SL tablet Place 1 tablet (0.4 mg total) under the tongue every 5 (five) minutes as needed for chest pain. 05/17/23   Frann Mabel Mt, DO  potassium chloride  SA (KLOR-CON  M) 20 MEQ tablet Take 1 tablet (20 mEq total) by mouth daily. 04/15/23   Daneen Damien BROCKS, NP  sacubitril -valsartan  (ENTRESTO ) 97-103 MG Take 1 tablet by mouth 2 (two) times daily. 06/13/23   Madireddy, Alean SAUNDERS, MD    Allergies: Patient has no known allergies.    Review of Systems  Updated Vital Signs BP 125/84 (BP Location: Right Arm)   Pulse 78   Temp 98.2 F (36.8 C) (Oral)   Resp 20   Ht 5' 11 (1.803 m)   Wt 97.5 kg   SpO2 98%   BMI 29.99 kg/m   Physical Exam Vitals and nursing note reviewed.  Constitutional:  General: He is not in acute distress.    Comments: Pleasant well-appearing 68 year old man appears younger than stated age.  In no acute distress.  Sitting comfortably in bed.  Able answer questions appropriately follow commands. No increased work of breathing. Speaking in full sentences.   HENT:     Head: Normocephalic and atraumatic.     Nose: Nose normal.  Eyes:     General: No scleral icterus. Cardiovascular:     Rate and Rhythm: Normal rate and regular rhythm.     Pulses: Normal pulses.     Heart sounds: Normal heart sounds.  Pulmonary:     Effort: Pulmonary effort is normal. No respiratory distress.     Breath sounds: No wheezing.  Abdominal:     Palpations: Abdomen is soft.     Tenderness:  There is no abdominal tenderness.  Musculoskeletal:     Cervical back: Normal range of motion.     Right lower leg: No edema.     Left lower leg: No edema.  Skin:    General: Skin is warm and dry.     Capillary Refill: Capillary refill takes less than 2 seconds.  Neurological:     Mental Status: He is alert. Mental status is at baseline.  Psychiatric:        Mood and Affect: Mood normal.        Behavior: Behavior normal.     (all labs ordered are listed, but only abnormal results are displayed) Labs Reviewed  RESP PANEL BY RT-PCR (RSV, FLU A&B, COVID)  [id number]    EKG: None  Radiology: No results found.   Procedures   Medications Ordered in the ED - No data to display                                  Medical Decision Making Risk OTC drugs.    Patient is a 68 year old male with past medical history significant for CAD status post four-vessel CABG, HLD, sleep paralysis, DM2, other medical diseases  Patient presents emergency room today with complaints of coughing and sneezing over the past 2 days he states he is not been short of breath has not had a fever he does not have any chest pain.  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  No recent surgeries, hospitalization, long travel, hemoptysis, estrogen containing OCP, cancer history.  No unilateral leg swelling.  No history of PE or VTE.    Negative COVID strep and influenza PCR  I discussed the patient that given his medical history it is not unreasonable to get a chest x-ray and even some basic labs to further evaluate his symptoms however he states he is only here for COVID test feels well and would like to be discharged home.  This is not unreasonable we will treat him as a viral illness given that this is most likely his etiology recommend fluticasone  and Zyrtec .  Return precautions discussed    Final diagnoses:  Viral  illness    ED Discharge Orders          Ordered    fluticasone  (FLONASE ) 50 MCG/ACT nasal spray  Daily        10/03/23 1747    cetirizine  (ZYRTEC  ALLERGY) 10 MG tablet  Daily        10/03/23 1747  Neldon Inoue [place], [place] 10/03/23 AVA    Lenor Hollering, MD 10/03/23 2252

## 2023-10-03 NOTE — Discharge Instructions (Signed)
 Zyrtec  and fluticosone   Follow up with your primary care doctor

## 2023-10-08 ENCOUNTER — Other Ambulatory Visit (HOSPITAL_BASED_OUTPATIENT_CLINIC_OR_DEPARTMENT_OTHER): Payer: Self-pay

## 2023-10-08 ENCOUNTER — Other Ambulatory Visit: Payer: Self-pay

## 2023-10-08 ENCOUNTER — Other Ambulatory Visit: Payer: Self-pay | Admitting: Family Medicine

## 2023-10-08 DIAGNOSIS — E782 Mixed hyperlipidemia: Secondary | ICD-10-CM

## 2023-10-08 MED ORDER — ATORVASTATIN CALCIUM 80 MG PO TABS
80.0000 mg | ORAL_TABLET | Freq: Every day | ORAL | 3 refills | Status: AC
  Filled 2023-10-08 – 2023-11-14 (×2): qty 90, 90d supply, fill #0

## 2023-10-18 ENCOUNTER — Other Ambulatory Visit (HOSPITAL_BASED_OUTPATIENT_CLINIC_OR_DEPARTMENT_OTHER): Payer: Self-pay

## 2023-11-13 ENCOUNTER — Telehealth: Payer: Self-pay | Admitting: Family Medicine

## 2023-11-13 NOTE — Telephone Encounter (Signed)
 No. Cancel his lab visit and sched with me 11/20 or after. Thx.

## 2023-11-13 NOTE — Telephone Encounter (Signed)
 Good morning does this pt need a lab order

## 2023-11-13 NOTE — Telephone Encounter (Signed)
 Appt canceled and called pt Lvm asking him to call the office back needing to schedule lab appt.

## 2023-11-14 ENCOUNTER — Other Ambulatory Visit (HOSPITAL_BASED_OUTPATIENT_CLINIC_OR_DEPARTMENT_OTHER): Payer: Self-pay

## 2023-11-25 ENCOUNTER — Other Ambulatory Visit

## 2023-11-29 ENCOUNTER — Telehealth: Payer: Self-pay | Admitting: Family Medicine

## 2023-11-29 NOTE — Telephone Encounter (Signed)
 Copied from CRM [id number]. Topic: Medicare AWV >> Nov 29, 2023  9:57 AM Nathanel DEL wrote: Called LVM 11/29/2023 to sched AWV. Please schedule in office or virtual visit.   Nathanel Paschal; Care Guide Ambulatory Clinical Support [place] [facility] Direct Dial: [phone]

## 2023-11-30 ENCOUNTER — Encounter (HOSPITAL_BASED_OUTPATIENT_CLINIC_OR_DEPARTMENT_OTHER): Payer: Self-pay | Admitting: Pharmacist

## 2023-11-30 NOTE — Progress Notes (Signed)
 Pharmacy Quality Measure Review  This patient is appearing on a report for being at risk of failing the adherence measure for cholesterol (statin) medications this calendar year.   Medication: atorvastatin   Last fill date: 07/09/2023 for 90 day supply  Reviewed recent refill history in Dr Annemarie database. Actual last refill date was 11/14/2023 for 90 day supply. Patient has 2 refills remaining. Next appointment with PCP is not currently scheduled. Last seen 08/2023.  Follow up recommended in 6 months.    Insurance report was not up to date. No action needed at this time.   Madelin Ray, PharmD Clinical Pharmacist [facility] Primary [facility]  Population Health [phone]

## 2023-12-03 ENCOUNTER — Ambulatory Visit

## 2023-12-03 VITALS — Ht 71.0 in | Wt 216.0 lb

## 2023-12-03 DIAGNOSIS — Z0000 Encounter for general adult medical examination without abnormal findings: Secondary | ICD-10-CM

## 2023-12-03 NOTE — Patient Instructions (Addendum)
 Mr. Bartles,  Thank you for taking the time for your Medicare Wellness Visit. I appreciate your continued commitment to your health goals. Please review the care plan we discussed, and feel free to reach out if I can assist you further.  Please note that Annual Wellness Visits do not include a physical exam. Some assessments may be limited, especially if the visit was conducted virtually. If needed, we may recommend an in-person follow-up with your provider.  Ongoing Care Seeing your primary care provider every 3 to 6 months helps us  monitor your health and provide consistent, personalized care.   Referrals If a referral was made during today's visit and you haven't received any updates within two weeks, please contact the referred provider directly to check on the status.  Recommended Screenings:  Health Maintenance  Topic Date Due   Eye exam for diabetics  05/11/2022   Flu Shot  Never done   Hemoglobin A1C  02/28/2024   DTaP/Tdap/Td vaccine (2 - Td or Tdap) 06/03/2024   Colon Cancer Screening  06/03/2024   Yearly kidney health urinalysis for diabetes  08/27/2024   Complete foot exam   08/27/2024   Yearly kidney function blood test for diabetes  09/02/2024   Medicare Annual Wellness Visit  12/02/2024   Pneumococcal Vaccine for age over 68  Completed   Hepatitis C Screening  Completed   Meningitis B Vaccine  Aged Out   COVID-19 Vaccine  Discontinued   Zoster (Shingles) Vaccine  Discontinued       12/03/2023    8:12 AM  Advanced Directives  Does Patient Have a Medical Advance Directive? No  Would patient like information on creating a medical advance directive? No - Patient declined    Vision: Annual vision screenings are recommended for early detection of glaucoma, cataracts, and diabetic retinopathy. These exams can also reveal signs of chronic conditions such as diabetes and high blood pressure.  Dental: Annual dental screenings help detect early signs of oral cancer, gum  disease, and other conditions linked to overall health, including heart disease and diabetes.  Please see the attached documents for additional preventive care recommendations.
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
## 2023-12-06 ENCOUNTER — Other Ambulatory Visit (HOSPITAL_BASED_OUTPATIENT_CLINIC_OR_DEPARTMENT_OTHER): Payer: Self-pay

## 2024-01-07 NOTE — Progress Notes (Signed)
 Vincent [name]                                          MRN: [id number]   01/07/2024   The [facility] Quality Team Specialist reviewed this patient medical record for the purposes of chart review for care gap closure. The following were reviewed: chart review for care gap closure-colorectal cancer screening and diabetic eye exam.    [facility] Quality Team

## 2024-01-08 ENCOUNTER — Other Ambulatory Visit (HOSPITAL_BASED_OUTPATIENT_CLINIC_OR_DEPARTMENT_OTHER): Payer: Self-pay

## 2024-02-03 ENCOUNTER — Other Ambulatory Visit (HOSPITAL_BASED_OUTPATIENT_CLINIC_OR_DEPARTMENT_OTHER): Payer: Self-pay

## 2024-12-08 ENCOUNTER — Ambulatory Visit
